# Patient Record
Sex: Female | Born: 1975
Health system: Southern US, Community
[De-identification: ages and names within clinical notes are randomized; demographics above are authoritative.]

## PROBLEM LIST (undated history)

## (undated) DIAGNOSIS — J45909 Unspecified asthma, uncomplicated: Secondary | ICD-10-CM

## (undated) DIAGNOSIS — J449 Chronic obstructive pulmonary disease, unspecified: Secondary | ICD-10-CM

## (undated) DIAGNOSIS — F32A Depression, unspecified: Secondary | ICD-10-CM

## (undated) DIAGNOSIS — J309 Allergic rhinitis, unspecified: Secondary | ICD-10-CM

## (undated) DIAGNOSIS — F419 Anxiety disorder, unspecified: Secondary | ICD-10-CM

## (undated) DIAGNOSIS — E538 Deficiency of other specified B group vitamins: Secondary | ICD-10-CM

## (undated) DIAGNOSIS — I10 Essential (primary) hypertension: Secondary | ICD-10-CM

## (undated) DIAGNOSIS — T7840XA Allergy, unspecified, initial encounter: Secondary | ICD-10-CM

## (undated) DIAGNOSIS — I441 Atrioventricular block, second degree: Secondary | ICD-10-CM

## (undated) DIAGNOSIS — G43909 Migraine, unspecified, not intractable, without status migrainosus: Secondary | ICD-10-CM

## (undated) DIAGNOSIS — N926 Irregular menstruation, unspecified: Secondary | ICD-10-CM

## (undated) DIAGNOSIS — R6882 Decreased libido: Secondary | ICD-10-CM

## (undated) DIAGNOSIS — F320 Major depressive disorder, single episode, mild: Secondary | ICD-10-CM

## (undated) DIAGNOSIS — F17200 Nicotine dependence, unspecified, uncomplicated: Secondary | ICD-10-CM

## (undated) DIAGNOSIS — IMO0002 Reserved for concepts with insufficient information to code with codable children: Secondary | ICD-10-CM

## (undated) DIAGNOSIS — G629 Polyneuropathy, unspecified: Secondary | ICD-10-CM

## (undated) HISTORY — DX: Migraine, unspecified, not intractable, without status migrainosus: G43.909

## (undated) HISTORY — PX: COLONOSCOPY: SHX174

## (undated) HISTORY — DX: Deficiency of other specified B group vitamins: E53.8

## (undated) HISTORY — DX: Unspecified asthma, uncomplicated: J45.909

## (undated) HISTORY — DX: Decreased libido: R68.82

## (undated) HISTORY — DX: Chronic obstructive pulmonary disease, unspecified: J44.9

## (undated) HISTORY — DX: Allergy, unspecified, initial encounter: T78.40XA

## (undated) HISTORY — DX: Atrioventricular block, second degree: I44.1

## (undated) HISTORY — DX: Anxiety disorder, unspecified: F41.9

## (undated) HISTORY — DX: Major depressive disorder, single episode, mild: F32.0

## (undated) HISTORY — DX: Nicotine dependence, unspecified, uncomplicated: F17.200

## (undated) HISTORY — DX: Irregular menstruation, unspecified: N92.6

## (undated) HISTORY — DX: Polyneuropathy, unspecified: G62.9

## (undated) HISTORY — DX: Allergic rhinitis, unspecified: J30.9

## (undated) HISTORY — PX: ESOPHAGOGASTRODUODENOSCOPY: SHX1529

## (undated) HISTORY — DX: Reserved for concepts with insufficient information to code with codable children: IMO0002

## (undated) HISTORY — DX: Essential (primary) hypertension: I10

## (undated) HISTORY — DX: Depression, unspecified: F32.A

## (undated) HISTORY — PX: WISDOM TOOTH EXTRACTION: SHX21

---

## 2002-07-19 DIAGNOSIS — J4489 Other specified chronic obstructive pulmonary disease: Secondary | ICD-10-CM | POA: Insufficient documentation

## 2007-03-18 ENCOUNTER — Emergency Department: Payer: Self-pay | Admitting: Internal Medicine

## 2007-03-18 ENCOUNTER — Observation Stay: Payer: Self-pay | Admitting: Unknown Physician Specialty

## 2007-05-16 ENCOUNTER — Ambulatory Visit: Payer: Self-pay

## 2007-06-24 ENCOUNTER — Observation Stay: Payer: Self-pay | Admitting: Unknown Physician Specialty

## 2007-06-28 ENCOUNTER — Inpatient Hospital Stay: Payer: Self-pay | Admitting: Obstetrics & Gynecology

## 2010-01-21 HISTORY — PX: NASAL SINUS SURGERY: SHX719

## 2012-12-22 LAB — HM PAP SMEAR: HM Pap smear: NORMAL

## 2014-10-10 ENCOUNTER — Ambulatory Visit: Payer: Self-pay | Admitting: Family Medicine

## 2015-03-10 ENCOUNTER — Encounter: Payer: Self-pay | Admitting: Family Medicine

## 2015-04-27 ENCOUNTER — Ambulatory Visit: Payer: Self-pay | Admitting: Family Medicine

## 2015-04-29 ENCOUNTER — Other Ambulatory Visit: Payer: Self-pay | Admitting: Family Medicine

## 2015-05-06 ENCOUNTER — Other Ambulatory Visit: Payer: Self-pay | Admitting: Family Medicine

## 2015-05-06 DIAGNOSIS — Z720 Tobacco use: Secondary | ICD-10-CM | POA: Insufficient documentation

## 2015-05-06 DIAGNOSIS — F411 Generalized anxiety disorder: Secondary | ICD-10-CM | POA: Insufficient documentation

## 2015-05-06 DIAGNOSIS — N926 Irregular menstruation, unspecified: Secondary | ICD-10-CM | POA: Insufficient documentation

## 2015-05-06 DIAGNOSIS — Z6831 Body mass index (BMI) 31.0-31.9, adult: Secondary | ICD-10-CM | POA: Insufficient documentation

## 2015-05-06 DIAGNOSIS — G43009 Migraine without aura, not intractable, without status migrainosus: Secondary | ICD-10-CM | POA: Insufficient documentation

## 2015-05-06 DIAGNOSIS — G2581 Restless legs syndrome: Secondary | ICD-10-CM | POA: Insufficient documentation

## 2015-05-06 DIAGNOSIS — F520 Hypoactive sexual desire disorder: Secondary | ICD-10-CM | POA: Insufficient documentation

## 2015-05-06 DIAGNOSIS — J4540 Moderate persistent asthma, uncomplicated: Secondary | ICD-10-CM | POA: Insufficient documentation

## 2015-05-06 DIAGNOSIS — I441 Atrioventricular block, second degree: Secondary | ICD-10-CM | POA: Insufficient documentation

## 2015-05-06 DIAGNOSIS — E538 Deficiency of other specified B group vitamins: Secondary | ICD-10-CM | POA: Insufficient documentation

## 2015-05-06 DIAGNOSIS — J3089 Other allergic rhinitis: Secondary | ICD-10-CM | POA: Insufficient documentation

## 2015-05-06 DIAGNOSIS — G629 Polyneuropathy, unspecified: Secondary | ICD-10-CM | POA: Insufficient documentation

## 2015-05-06 DIAGNOSIS — I10 Essential (primary) hypertension: Secondary | ICD-10-CM | POA: Insufficient documentation

## 2015-05-06 DIAGNOSIS — IMO0001 Reserved for inherently not codable concepts without codable children: Secondary | ICD-10-CM | POA: Insufficient documentation

## 2015-05-06 NOTE — Telephone Encounter (Signed)
Sent one month but needs follow up, last refill

## 2015-05-25 ENCOUNTER — Other Ambulatory Visit: Payer: Self-pay | Admitting: Family Medicine

## 2015-05-25 NOTE — Telephone Encounter (Signed)
She needs follow up

## 2015-05-26 ENCOUNTER — Telehealth: Payer: Self-pay | Admitting: Family Medicine

## 2015-05-26 NOTE — Telephone Encounter (Signed)
PT IS OUT OF ESCITALOPRAM AND ALPRAZOLM. SHE TRIED GETTING AN APPT BUT YOU DO NOT HAV ANYTHING TIL THE FIRST AVAIL AUG 8TH. SHE IS OUT OF HER MEDICATION AND NEEDS THESE REFILLS. PHARM IS [facility] [address]

## 2015-05-27 ENCOUNTER — Other Ambulatory Visit: Payer: Self-pay

## 2015-05-27 MED ORDER — ESCITALOPRAM OXALATE 10 MG PO TABS: 15.0000 mg | ORAL_TABLET | Freq: Every day | ORAL | Status: DC

## 2015-05-27 MED ORDER — ALPRAZOLAM 0.5 MG PO TABS: 0.5000 mg | ORAL_TABLET | Freq: Two times a day (BID) | ORAL | Status: DC | PRN

## 2015-06-12 ENCOUNTER — Other Ambulatory Visit: Payer: Self-pay

## 2015-06-12 DIAGNOSIS — I10 Essential (primary) hypertension: Secondary | ICD-10-CM

## 2015-06-13 MED ORDER — LOSARTAN POTASSIUM-HCTZ 100-25 MG PO TABS: 1.0000 | ORAL_TABLET | Freq: Every day | ORAL | Status: DC

## 2015-06-29 ENCOUNTER — Ambulatory Visit (INDEPENDENT_AMBULATORY_CARE_PROVIDER_SITE_OTHER): Payer: Managed Care, Other (non HMO) | Admitting: Family Medicine

## 2015-06-29 ENCOUNTER — Encounter (INDEPENDENT_AMBULATORY_CARE_PROVIDER_SITE_OTHER): Payer: Self-pay

## 2015-06-29 ENCOUNTER — Encounter: Payer: Self-pay | Admitting: Family Medicine

## 2015-06-29 VITALS — BP 118/62 | HR 107 | Temp 98.3°F | Resp 18 | Ht 66.0 in | Wt 198.4 lb

## 2015-06-29 DIAGNOSIS — F419 Anxiety disorder, unspecified: Secondary | ICD-10-CM

## 2015-06-29 DIAGNOSIS — I10 Essential (primary) hypertension: Secondary | ICD-10-CM

## 2015-06-29 DIAGNOSIS — J309 Allergic rhinitis, unspecified: Secondary | ICD-10-CM | POA: Diagnosis not present

## 2015-06-29 DIAGNOSIS — G43009 Migraine without aura, not intractable, without status migrainosus: Secondary | ICD-10-CM

## 2015-06-29 DIAGNOSIS — G2581 Restless legs syndrome: Secondary | ICD-10-CM

## 2015-06-29 DIAGNOSIS — M67431 Ganglion, right wrist: Secondary | ICD-10-CM

## 2015-06-29 DIAGNOSIS — G629 Polyneuropathy, unspecified: Secondary | ICD-10-CM | POA: Diagnosis not present

## 2015-06-29 DIAGNOSIS — J3089 Other allergic rhinitis: Secondary | ICD-10-CM

## 2015-06-29 MED ORDER — LOSARTAN POTASSIUM-HCTZ 100-25 MG PO TABS: 1.0000 | ORAL_TABLET | Freq: Every day | ORAL | Status: DC

## 2015-06-29 MED ORDER — LEVOCETIRIZINE DIHYDROCHLORIDE 5 MG PO TABS: 5.0000 mg | ORAL_TABLET | Freq: Every day | ORAL | Status: DC

## 2015-06-29 MED ORDER — GABAPENTIN ENACARBIL ER 300 MG PO TBCR: 1.0000 | EXTENDED_RELEASE_TABLET | Freq: Every day | ORAL | Status: DC

## 2015-06-29 MED ORDER — ESCITALOPRAM OXALATE 20 MG PO TABS: 20.0000 mg | ORAL_TABLET | Freq: Every day | ORAL | Status: DC

## 2015-06-29 MED ORDER — ALPRAZOLAM 0.5 MG PO TABS: 0.5000 mg | ORAL_TABLET | Freq: Two times a day (BID) | ORAL | Status: DC | PRN

## 2015-06-29 NOTE — Progress Notes (Signed)
Name: Destiny Atkinson   MRN: [id number]    DOB: 1976-01-12   Date:06/29/2015       Progress Note  Subjective  Chief Complaint  Chief Complaint  Patient presents with  . Medication Refill    3 month F/U  . Hypertension    No problems checks at work 122/74  . Anxiety    unchanged- takes medication when patient feels anxiety coming on  . Depression    feeling anxious again about 2-3 hours before medication is due    HPI   Migraine : she states she had two episodes since last visit, 3 months ago. She controls symptoms with Excedrin migraine and a nap, she does not want to change her regiment since symptoms have improved. She states she has phonophobia, and also photophobia. No nausea or vomiting. Migraine is described as a sharp pain over left eye and radiates to left temporal area.  HTN: taking medications and denies side effects, bp is usually well controlled  Anxiety/Depression: taking 15mg  of Lexapro daily, but feels very edgy right before she takes the next dose.  Otherwise she feels like is controlling symptoms. She has gained weight and is frustrated because she has been eating healthier and exercising, we will try adding Wellbutrin on her next visit  RSL/Peripheral Neuropathy: She has a need to move her legs when sitting down, also states that at night her legs moves constantly. She also states than when she stands up for a long time her legs feels sore/a discomfort sensation that goes from her buttocks down to her legs. She feels a prickling sensation from her feet to her toes when she first stands up. Requip stopped working and would like to try something else.  Patient Active Problem List   Diagnosis Date Noted  . Anxiety 05/06/2015  . Asthma, moderate persistent 05/06/2015  . Mobitz type I incomplete atrioventricular block 05/06/2015  . B12 deficiency 05/06/2015  . Essential (primary) hypertension 05/06/2015  . Irregular bleeding 05/06/2015  . Migraine without aura and  responsive to treatment 05/06/2015  . Lack of erotic interest 05/06/2015  . Excess weight 05/06/2015  . Peripheral neuropathy 05/06/2015  . Perennial allergic rhinitis 05/06/2015  . Restless leg 05/06/2015  . Tobacco abuse 05/06/2015    Past Surgical History  Procedure Laterality Date  . Nasal sinus surgery      Family History  Problem Relation Age of Onset  . COPD Mother   . Fibromyalgia Mother   . Hypertension Mother   . Stroke Mother   . Asthma Mother   . Arthritis Mother   . Depression Mother   . Heart disease Mother   . Diabetes Father   . Epilepsy Brother     History   Social History  . Marital Status: Married    Spouse Name: N/A  . Number of Children: N/A  . Years of Education: N/A   Occupational History  . Not on file.   Social History Main Topics  . Smoking status: Current Every Day Smoker -- 0.50 packs/day for 20 years    Types: Cigarettes    Start date: 06/29/1995  . Smokeless tobacco: Never Used  . Alcohol Use: 0.0 oz/week    0 Standard drinks or equivalent per week     Comment: rarely  . Drug Use: No  . Sexual Activity:    Partners: Male    Birth Control/ Protection: Pill   Other Topics Concern  . Not on file   Social History Narrative  .  No narrative on file     Current outpatient prescriptions:  .  albuterol (PROAIR HFA) 108 (90 BASE) MCG/ACT inhaler, Inhale 2 puffs into the lungs every 4 (four) hours as needed., Disp: , Rfl:  .  ALPRAZolam (XANAX) 0.5 MG tablet, Take 1 tablet (0.5 mg total) by mouth 2 (two) times daily as needed., Disp: 60 tablet, Rfl: 0 .  EPINEPHrine (EPIPEN 2-PAK) 0.3 mg/0.3 mL IJ SOAJ injection, 1 Device., Disp: , Rfl:  .  escitalopram (LEXAPRO) 20 MG tablet, Take 1 tablet (20 mg total) by mouth daily., Disp: 90 tablet, Rfl: 1 .  Fluticasone Furoate-Vilanterol (BREO ELLIPTA) 100-25 MCG/INH AEPB, Inhale 1 puff into the lungs daily., Disp: , Rfl:  .  ipratropium-albuterol (DUONEB) 0.5-2.5 (3) MG/3ML SOLN, Take 3 mLs  by nebulization every 6 (six) hours as needed., Disp: , Rfl:  .  levocetirizine (XYZAL) 5 MG tablet, Take 1 tablet (5 mg total) by mouth daily., Disp: 90 tablet, Rfl: 1 .  losartan-hydrochlorothiazide (HYZAAR) 100-25 MG per tablet, Take 1 tablet by mouth daily., Disp: 90 tablet, Rfl: 1 .  norethindrone (MICRONOR,CAMILA,ERRIN) 0.35 MG tablet, Take 1 tablet by mouth daily., Disp: , Rfl:  .  Gabapentin Enacarbil ER 300 MG TBCR, Take 1 tablet by mouth daily. Increase to two daily if no improvement after one week, Disp: 60 tablet, Rfl: 0  Allergies  Allergen Reactions  . Dog Epithelium     Anaphylaxis with Rabbits.  Dogs, cats, birds cause swelling, extreme itching.  . Dust Mite Extract   . Montelukast Sodium Hives  . Pollen Extract   . Soap   . Tape   . Tree Extract      ROS  Constitutional: Negative for fever or significant  weight change.  Respiratory: Negative for cough and shortness of breath.   Cardiovascular: Negative for chest pain or palpitations.  Gastrointestinal: Negative for abdominal pain, no bowel changes.  Musculoskeletal: Negative for gait problem or joint swelling. Cyst on right wrist Skin: Negative for rash.  Neurological: Negative for dizziness or headache.  No other specific complaints in a complete review of systems (except as listed in HPI above).  Objective  Filed Vitals:   06/29/15 1509  BP: 118/62  Pulse: 107  Temp: 98.3 F (36.8 C)  TempSrc: Oral  Resp: 18  Height: 5\' 6"  (1.676 m)  Weight: 198 lb 6.4 oz (89.994 kg)  SpO2: 94%    Body mass index is 32.04 kg/(m^2).  Physical Exam  Constitutional: Patient appears well-developed and well-nourished. Obese  No distress.  HEENT: head atraumatic, normocephalic, pupils equal and reactive to light,  neck supple, throat within normal limits Cardiovascular: Normal rate, regular rhythm and normal heart sounds.  No murmur heard. No BLE edema. Pedis dorsalis 2 plus bilaterally  Pulmonary/Chest: Effort normal  and breath sounds normal. No respiratory distress. Abdominal: Soft.  There is no tenderness. Psychiatric: Patient has a normal mood and affect. behavior is normal. Judgment and thought content normal. Muscular Skeletal: ganglion cyst on right wrist    PHQ2/9: Depression screen PHQ 2/9 06/29/2015  Decreased Interest 0  Down, Depressed, Hopeless 1  PHQ - 2 Score 1    Fall Risk: Fall Risk  06/29/2015  Falls in the past year? No     Assessment & Plan  1. Migraine without aura and responsive to treatment Doing well   2. Restless leg We will try Horizant - Gabapentin Enacarbil ER 300 MG TBCR; Take 1 tablet by mouth daily. Increase to two daily  if no improvement after one week  Dispense: 60 tablet; Refill: 0  3. Perennial allergic rhinitis  - levocetirizine (XYZAL) 5 MG tablet; Take 1 tablet (5 mg total) by mouth daily.  Dispense: 90 tablet; Refill: 1  4. Peripheral neuropathy Try Horizant  5. Essential hypertension At goal  - losartan-hydrochlorothiazide (HYZAAR) 100-25 MG per tablet; Take 1 tablet by mouth daily.  Dispense: 90 tablet; Refill: 1  6. Anxiety Increase dose of Lexapro to , consider adding WEllbutrin next visit - escitalopram (LEXAPRO) 20 MG tablet; Take 1 tablet (20 mg total) by mouth daily.  Dispense: 90 tablet; Refill: 1 - ALPRAZolam (XANAX) 0.5 MG tablet; Take 1 tablet (0.5 mg total) by mouth 2 (two) times daily as needed.  Dispense: 60 tablet; Refill: 0  7. Ganglion cyst of wrist, right reassurance

## 2015-09-22 ENCOUNTER — Encounter: Payer: Self-pay | Admitting: Emergency Medicine

## 2015-09-22 ENCOUNTER — Ambulatory Visit
Admission: EM | Admit: 2015-09-22 | Discharge: 2015-09-22 | Disposition: A | Discharge status: Home / Self Care | Payer: Managed Care, Other (non HMO) | Attending: Family Medicine | Admitting: Family Medicine

## 2015-09-22 ENCOUNTER — Ambulatory Visit: Payer: Managed Care, Other (non HMO)

## 2015-09-22 DIAGNOSIS — J45901 Unspecified asthma with (acute) exacerbation: Secondary | ICD-10-CM | POA: Diagnosis not present

## 2015-09-22 DIAGNOSIS — Z720 Tobacco use: Secondary | ICD-10-CM

## 2015-09-22 DIAGNOSIS — J40 Bronchitis, not specified as acute or chronic: Secondary | ICD-10-CM

## 2015-09-22 MED ORDER — AZITHROMYCIN 250 MG PO TABS: ORAL_TABLET | ORAL | Status: DC

## 2015-09-22 MED ORDER — METHYLPREDNISOLONE SODIUM SUCC 125 MG IJ SOLR
125.0000 mg | Freq: Once | INTRAMUSCULAR | Status: AC
  Administered 2015-09-22: 125 mg via INTRAMUSCULAR

## 2015-09-22 MED ORDER — PREDNISONE 10 MG (21) PO TBPK: ORAL_TABLET | ORAL | Status: DC

## 2015-09-22 MED ORDER — HYDROCOD POLST-CPM POLST ER 10-8 MG/5ML PO SUER: 5.0000 mL | Freq: Two times a day (BID) | ORAL | Status: DC | PRN

## 2015-09-22 NOTE — ED Provider Notes (Signed)
CSN: [id number]     Arrival date & time 09/22/15  7829 History   First MD Initiated Contact with Patient 09/22/15 1111    Nurses notes were reviewed. Chief Complaint  Patient presents with  . Facial Pain  . Cough   patient's been coughing now for about 3 weeks history of asthma which has been getting worse. Initially was productive now is longer productive. She does smoke states that the last few days she's not been able to smoke (Consider location/radiation/quality/duration/timing/severity/associated sxs/prior Treatment) Patient is a 39 y.o. female presenting with cough.  Cough Cough characteristics:  Productive and non-productive Sputum characteristics:  White Severity:  Moderate Onset quality:  Sudden Duration:  3 weeks Timing:  Constant Progression:  Worsening Chronicity:  New Smoker: yes   Context: sick contacts, smoke exposure and upper respiratory infection   Context: not animal exposure, not exposure to allergens, not occupational exposure, not weather changes and not with activity   Relieved by:  Nothing Ineffective treatments:  Beta-agonist inhaler, ipratropium inhaler and home nebulizer Associated symptoms: shortness of breath and wheezing   Associated symptoms: no chest pain, no fever, no headaches and no myalgias   Risk factors: recent infection   Risk factors: no chemical exposure and no recent travel     Past Medical History  Diagnosis Date  . Anxiety   . Hypertension   . Asthma   . B12 deficiency   . Migraines   . Irregular menstrual bleeding   . Decreased libido    Past Surgical History  Procedure Laterality Date  . Nasal sinus surgery     Family History  Problem Relation Age of Onset  . COPD Mother   . Fibromyalgia Mother   . Hypertension Mother   . Stroke Mother   . Asthma Mother   . Arthritis Mother   . Depression Mother   . Heart disease Mother   . Diabetes Father   . Epilepsy Brother    Social History  Substance Use Topics  . Smoking  status: Current Every Day Smoker -- 0.50 packs/day for 20 years    Types: Cigarettes    Start date: 06/29/1995  . Smokeless tobacco: Never Used  . Alcohol Use: 0.0 oz/week    0 Standard drinks or equivalent per week     Comment: rarely   OB History    No data available     Review of Systems  Constitutional: Negative for fever.  Respiratory: Positive for cough, shortness of breath and wheezing.   Cardiovascular: Negative for chest pain.  Musculoskeletal: Negative for myalgias.  Neurological: Negative for headaches.    Allergies  Dog epithelium; Dust mite extract; Montelukast sodium; Pollen extract; Soap; Tape; and Tree extract  Home Medications   Prior to Admission medications   Medication Sig Start Date End Date Taking? Authorizing Provider  albuterol (PROAIR HFA) 108 (90 BASE) MCG/ACT inhaler Inhale 2 puffs into the lungs every 4 (four) hours as needed. 03/14/14   Historical Provider, MD  ALPRAZolam Prudy Feeler) 0.5 MG tablet Take 1 tablet (0.5 mg total) by mouth 2 (two) times daily as needed. 06/29/15   Alba Cory, MD  azithromycin (ZITHROMAX Z-PAK) 250 MG tablet Take 2 tablets first day and then 1 po a day for 4 days 09/22/15   Hassan Rowan, MD  chlorpheniramine-HYDROcodone [facility] ER) 10-8 MG/5ML SUER Take 5 mLs by mouth every 12 (twelve) hours as needed for cough. 09/22/15   Hassan Rowan, MD  EPINEPHrine (EPIPEN 2-PAK) 0.3 mg/0.3 mL IJ  SOAJ injection 1 Device. 08/16/13   Historical Provider, MD  escitalopram (LEXAPRO) 20 MG tablet Take 1 tablet (20 mg total) by mouth daily. 06/29/15   Alba Cory, MD  Fluticasone Furoate-Vilanterol (BREO ELLIPTA) 100-25 MCG/INH AEPB Inhale 1 puff into the lungs daily.    Historical Provider, MD  Gabapentin Enacarbil ER 300 MG TBCR Take 1 tablet by mouth daily. Increase to two daily if no improvement after one week 06/29/15   Alba Cory, MD  ipratropium-albuterol (DUONEB) 0.5-2.5 (3) MG/3ML SOLN Take 3 mLs by nebulization every 6 (six)  hours as needed.    Yevonne Pax, MD  levocetirizine (XYZAL) 5 MG tablet Take 1 tablet (5 mg total) by mouth daily. 06/29/15   Alba Cory, MD  losartan-hydrochlorothiazide (HYZAAR) 100-25 MG per tablet Take 1 tablet by mouth daily. 06/29/15   Alba Cory, MD  norethindrone (MICRONOR,CAMILA,ERRIN) 0.35 MG tablet Take 1 tablet by mouth daily.    Historical Provider, MD  predniSONE (STERAPRED UNI-PAK 21 TAB) 10 MG (21) TBPK tablet Sig 6 tablet day 1, 5 tablets day 2, 4 tablets day 3,,3tablets day 4, 2 tablets day 5, 1 tablet day 6 take all tablets orally 09/22/15   Hassan Rowan, MD   Meds Ordered and Administered this Visit   Medications  methylPREDNISolone sodium succinate (SOLU-MEDROL) 125 mg/2 mL injection 125 mg (125 mg Intramuscular Given 09/22/15 1133)    BP 115/76 mmHg  Pulse 83  Temp(Src) 98.2 F (36.8 C) (Tympanic)  Resp 18  Ht  (1.676 m)  Wt 195 lb (88.451 kg)  BMI 31.49 kg/m2  SpO2 98%  LMP 09/05/2015 (Exact Date) No data found.   Physical Exam  Constitutional: She is oriented to person, place, and time. She appears well-developed and well-nourished.  HENT:  Head: Normocephalic.  Right Ear: External ear normal.  Left Ear: External ear normal.  Eyes: Pupils are equal, round, and reactive to light.  Neck: Normal range of motion. Neck supple.  Cardiovascular: Normal rate, regular rhythm and normal heart sounds.   Pulmonary/Chest: Effort normal. She has decreased breath sounds. She has no wheezes. She has no rhonchi.  Actively coughing  Musculoskeletal: Normal range of motion.  Neurological: She is alert and oriented to person, place, and time.  Skin: Skin is warm and dry.  Psychiatric: She has a normal mood and affect.  Vitals reviewed.   ED Course  Procedures (including critical care time)  Labs Review Labs Reviewed - No data to display  Imaging Review Dg Chest 2 View  09/22/2015  CLINICAL DATA:  Cough for 3 weeks EXAM: CHEST  2 VIEW COMPARISON:   10/10/2014 FINDINGS: Cardiomediastinal silhouette is stable. No acute infiltrate or pleural effusion. No pulmonary edema. Mild perihilar bronchitic changes. Bony thorax is unremarkable. IMPRESSION: No acute infiltrate or pulmonary edema. Mild perihilar bronchitic changes. Electronically Signed   By: Natasha Mead M.D.   On: 09/22/2015 12:36     Visual Acuity Review  Right Eye Distance:   Left Eye Distance:   Bilateral Distance:    Right Eye Near:   Left Eye Near:    Bilateral Near:         MDM   1. Bronchitis   2. Asthma exacerbation   3. Tobacco abuse    Patient was given on 125 mg Solu-Medrol with improvement. She still coughing significantly so we will also get a chest x-ray and make sure no pneumonia is present. We'll place on prednisone Dosepak 6 days Z-Pak and testing for the  cough providing chest x-rays negative for pneumonia and will give a work note for today and tomorrow well.    Hassan RowanEugene Loletta Harper, MD 09/22/15 [phone]

## 2015-09-22 NOTE — Discharge Instructions (Signed)
Asthma, Adult Asthma is a condition of the lungs in which the airways tighten and narrow. Asthma can make it hard to breathe. Asthma cannot be cured, but medicine and lifestyle changes can help control it. Asthma may be started (triggered) by:  Animal skin flakes (dander).  Dust.  Cockroaches.  Pollen.  Mold.  Smoke.  Cleaning products.  Hair sprays or aerosol sprays.  Paint fumes or strong smells.  Cold air, weather changes, and winds.  Crying or laughing hard.  Stress.  Certain medicines or drugs.  Foods, such as dried fruit, potato chips, and sparkling grape juice.  Infections or conditions (colds, flu).  Exercise.  Certain medical conditions or diseases.  Exercise or tiring activities. HOME CARE   Take medicine as told by your doctor.  Use a peak flow meter as told by your doctor. A peak flow meter is a tool that measures how well the lungs are working.  Record and keep track of the peak flow meter's readings.  Understand and use the asthma action plan. An asthma action plan is a written plan for taking care of your asthma and treating your attacks.  To help prevent asthma attacks:  Do not smoke. Stay away from secondhand smoke.  Change your heating and air conditioning filter often.  Limit your use of fireplaces and wood stoves.  Get rid of pests (such as roaches and mice) and their droppings.  Throw away plants if you see mold on them.  Clean your floors. Dust regularly. Use cleaning products that do not smell.  Have someone vacuum when you are not home. Use a vacuum cleaner with a HEPA filter if possible.  Replace carpet with wood, tile, or vinyl flooring. Carpet can trap animal skin flakes and dust.  Use allergy-proof pillows, mattress covers, and box spring covers.  Wash bed sheets and blankets every week in hot water and dry them in a dryer.  Use blankets that are made of polyester or cotton.  Clean bathrooms and kitchens with bleach.  If possible, have someone repaint the walls in these rooms with mold-resistant paint. Keep out of the rooms that are being cleaned and painted.  Wash hands often. GET HELP IF:  You have make a whistling sound when breaking (wheeze), have shortness of breath, or have a cough even if taking medicine to prevent attacks.  The colored mucus you cough up (sputum) is thicker than usual.  The colored mucus you cough up changes from clear or white to yellow, green, gray, or bloody.  You have problems from the medicine you are taking such as:  A rash.  Itching.  Swelling.  Trouble breathing.  You need reliever medicines more than 2-3 times a week.  Your peak flow measurement is still at 50-79% of your personal best after following the action plan for 1 hour.  You have a fever. GET HELP RIGHT AWAY IF:   You seem to be worse and are not responding to medicine during an asthma attack.  You are short of breath even at rest.  You get short of breath when doing very little activity.  You have trouble eating, drinking, or talking.  You have chest pain.  You have a fast heartbeat.  Your lips or fingernails start to turn blue.  You are light-headed, dizzy, or faint.  Your peak flow is less than 50% of your personal best.   This information is not intended to replace advice given to you by your health care provider. Make sure  you discuss any questions you have with your health care provider.   Document Released: 04/25/2008 Document Revised: 07/29/2015 Document Reviewed: 06/06/2013 Elsevier Interactive Patient Education 2016 Else[employer] Inc.  Bronchospasm, Adult A bronchospasm is when the tubes that carry air in and out of your lungs (airways) spasm or tighten. During a bronchospasm it is hard to breathe. This is because the airways get smaller. A bronchospasm can be triggered by:  Allergies. These may be to animals, pollen, food, or mold.  Infection. This is a common cause of  bronchospasm.  Exercise.  Irritants. These include pollution, cigarette smoke, strong odors, aerosol sprays, and paint fumes.  Weather changes.  Stress.  Being emotional. HOME CARE   Always have a plan for getting help. Know when to call your doctor and local emergency services (911 in the [place] Know where you can get emergency care.  Only take medicines as told by your doctor.  If you were prescribed an inhaler or nebulizer machine, ask your doctor how to use it correctly. Always use a spacer with your inhaler if you were given one.  Stay calm during an attack. Try to relax and breathe more slowly.  Control your home environment:  Change your heating and air conditioning filter at least once a month.  Limit your use of fireplaces and wood stoves.  Do not  smoke. Do not  allow smoking in your home.  Avoid perfumes and fragrances.  Get rid of pests (such as roaches and mice) and their droppings.  Throw away plants if you see mold on them.  Keep your house clean and dust free.  Replace carpet with wood, tile, or vinyl flooring. Carpet can trap dander and dust.  Use allergy-proof pillows, mattress covers, and box spring covers.  Wash bed sheets and blankets every week in hot water. Dry them in a dryer.  Use blankets that are made of polyester or cotton.  Wash hands frequently. GET HELP IF:  You have muscle aches.  You have chest pain.  The thick spit you spit or cough up (sputum) changes from clear or white to yellow, green, gray, or bloody.  The thick spit you spit or cough up gets thicker.  There are problems that may be related to the medicine you are given such as:  A rash.  Itching.  Swelling.  Trouble breathing. GET HELP RIGHT AWAY IF:  You feel you cannot breathe or catch your breath.  You cannot stop coughing.  Your treatment is not helping you breathe better.  You have very bad chest pain. MAKE SURE YOU:   Understand these  instructions.  Will watch your condition.  Will get help right away if you are not doing well or get worse.   This information is not intended to replace advice given to you by your health care provider. Make sure you discuss any questions you have with your health care provider.   Document Released: 09/04/2009 Document Revised: 11/28/2014 Document Reviewed: 04/30/2013 Elsevier Interactive Patient Education 2016 [employer].  Steps to Quit Smoking  Smoking tobacco can be harmful to your health and can affect almost every organ in your body. Smoking puts you, and those around you, at risk for developing many serious chronic diseases. Quitting smoking is difficult, but it is one of the best things that you can do for your health. It is never too late to quit. WHAT ARE THE BENEFITS OF QUITTING SMOKING? When you quit smoking, you lower your risk of developing serious diseases  and conditions, such as:  Lung cancer or lung disease, such as COPD.  Heart disease.  Stroke.  Heart attack.  Infertility.  Osteoporosis and bone fractures. Additionally, symptoms such as coughing, wheezing, and shortness of breath may get better when you quit. You may also find that you get sick less often because your body is stronger at fighting off colds and infections. If you are pregnant, quitting smoking can help to reduce your chances of having a baby of low birth weight. HOW DO I GET READY TO QUIT? When you decide to quit smoking, create a plan to make sure that you are successful. Before you quit:  Pick a date to quit. Set a date within the next two weeks to give you time to prepare.  Write down the reasons why you are quitting. Keep this list in places where you will see it often, such as on your bathroom mirror or in your car or wallet.  Identify the people, places, things, and activities that make you want to smoke (triggers) and avoid them. Make sure to take these actions:  Throw away all  cigarettes at home, at work, and in your car.  Throw away smoking accessories, such as Set designerashtrays and lighters.  Clean your car and make sure to empty the ashtray.  Clean your home, including curtains and carpets.  Tell your family, friends, and coworkers that you are quitting. Support from your loved ones can make quitting easier.  Talk with your health care provider about your options for quitting smoking.  Find out what treatment options are covered by your health insurance. WHAT STRATEGIES CAN I USE TO QUIT SMOKING?  Talk with your healthcare provider about different strategies to quit smoking. Some strategies include:  Quitting smoking altogether instead of gradually lessening how much you smoke over a period of time. Research shows that quitting "cold [place]key" is more successful than gradually quitting.  Attending in-person counseling to help you build problem-solving skills. You are more likely to have success in quitting if you attend several counseling sessions. Even short sessions of 10 minutes can be effective.  Finding resources and support systems that can help you to quit smoking and remain smoke-free after you quit. These resources are most helpful when you use them often. They can include:  Online chats with a Veterinary surgeoncounselor.  Telephone quitlines.  Printed Materials engineerself-help materials.  Support groups or group counseling.  Text messaging programs.  Mobile phone applications.  Taking medicines to help you quit smoking. (If you are pregnant or breastfeeding, talk with your health care provider first.) Some medicines contain nicotine and some do not. Both types of medicines help with cravings, but the medicines that include nicotine help to relieve withdrawal symptoms. Your health care provider may recommend:  Nicotine patches, gum, or lozenges.  Nicotine inhalers or sprays.  Non-nicotine medicine that is taken by mouth. Talk with your health care provider about combining  strategies, such as taking medicines while you are also receiving in-person counseling. Using these two strategies together makes you more likely to succeed in quitting than if you used either strategy on its own. If you are pregnant or breastfeeding, talk with your health care provider about finding counseling or other support strategies to quit smoking. Do not take medicine to help you quit smoking unless told to do so by your health care provider. WHAT THINGS CAN I DO TO MAKE IT EASIER TO QUIT? Quitting smoking might feel overwhelming at first, but there is a lot that  you can do to make it easier. Take these important actions:  Reach out to your family and friends and ask that they support and encourage you during this time. Call telephone quitlines, reach out to support groups, or work with a counselor for support.  Ask people who smoke to avoid smoking around you.  Avoid places that trigger you to smoke, such as bars, parties, or smoke-break areas at work.  Spend time around people who do not smoke.  Lessen stress in your life, because stress can be a smoking trigger for some people. To lessen stress, try:  Exercising regularly.  Deep-breathing exercises.  Yoga.  Meditating.  Performing a body scan. This involves closing your eyes, scanning your body from head to toe, and noticing which parts of your body are particularly tense. Purposefully relax the muscles in those areas.  Download or purchase mobile phone or tablet apps (applications) that can help you stick to your quit plan by providing reminders, tips, and encouragement. There are many free apps, such as QuitGuide from the [employer] developer for Disease Control and Prevention). You can find other support for quitting smoking (smoking cessation) through smokefree.gov and other websites. HOW WILL I FEEL WHEN I QUIT SMOKING? Within the first 24 hours of quitting smoking, you may start to feel some withdrawal symptoms. These symptoms are  usually most noticeable 2-3 days after quitting, but they usually do not last beyond 2-3 weeks. Changes or symptoms that you might experience include:  Mood swings.  Restlessness, anxiety, or irritation.  Difficulty concentrating.  Dizziness.  Strong cravings for sugary foods in addition to nicotine.  Mild weight gain.  Constipation.  Nausea.  Coughing or a sore throat.  Changes in how your medicines work in your body.  A depressed mood.  Difficulty sleeping (insomnia). After the first 2-3 weeks of quitting, you may start to notice more positive results, such as:  Improved sense of smell and taste.  Decreased coughing and sore throat.  Slower heart rate.  Lower blood pressure.  Clearer skin.  The ability to breathe more easily.  Fewer sick days. Quitting smoking is very challenging for most people. Do not get discouraged if you are not successful the first time. Some people need to make many attempts to quit before they achieve long-term success. Do your best to stick to your quit plan, and talk with your health care provider if you have any questions or concerns.   This information is not intended to replace advice given to you by your health care provider. Make sure you discuss any questions you have with your health care provider.   Document Released: 11/01/2001 Document Revised: 03/24/2015 Document Reviewed: 03/24/2015 Elsevier Interactive Patient Education [employer]

## 2015-09-22 NOTE — ED Notes (Signed)
Patient c/o cough, chest congestion, sinus pressure and congestion, and HAs for 3 weeks.

## 2015-10-01 ENCOUNTER — Ambulatory Visit: Payer: Managed Care, Other (non HMO) | Admitting: Family Medicine

## 2015-11-17 ENCOUNTER — Ambulatory Visit (INDEPENDENT_AMBULATORY_CARE_PROVIDER_SITE_OTHER): Payer: Managed Care, Other (non HMO) | Admitting: Family Medicine

## 2015-11-17 ENCOUNTER — Encounter: Payer: Self-pay | Admitting: Family Medicine

## 2015-11-17 VITALS — BP 108/66 | HR 91 | Temp 98.0°F | Resp 16 | Ht 66.0 in | Wt 197.8 lb

## 2015-11-17 DIAGNOSIS — Z13220 Encounter for screening for lipoid disorders: Secondary | ICD-10-CM

## 2015-11-17 DIAGNOSIS — G629 Polyneuropathy, unspecified: Secondary | ICD-10-CM

## 2015-11-17 DIAGNOSIS — F411 Generalized anxiety disorder: Secondary | ICD-10-CM | POA: Diagnosis not present

## 2015-11-17 DIAGNOSIS — J4541 Moderate persistent asthma with (acute) exacerbation: Secondary | ICD-10-CM

## 2015-11-17 DIAGNOSIS — G2581 Restless legs syndrome: Secondary | ICD-10-CM | POA: Diagnosis not present

## 2015-11-17 DIAGNOSIS — J3089 Other allergic rhinitis: Secondary | ICD-10-CM

## 2015-11-17 DIAGNOSIS — J309 Allergic rhinitis, unspecified: Secondary | ICD-10-CM | POA: Diagnosis not present

## 2015-11-17 DIAGNOSIS — E538 Deficiency of other specified B group vitamins: Secondary | ICD-10-CM | POA: Diagnosis not present

## 2015-11-17 DIAGNOSIS — I10 Essential (primary) hypertension: Secondary | ICD-10-CM

## 2015-11-17 DIAGNOSIS — G43009 Migraine without aura, not intractable, without status migrainosus: Secondary | ICD-10-CM | POA: Diagnosis not present

## 2015-11-17 DIAGNOSIS — F320 Major depressive disorder, single episode, mild: Secondary | ICD-10-CM | POA: Insufficient documentation

## 2015-11-17 DIAGNOSIS — M25522 Pain in left elbow: Secondary | ICD-10-CM

## 2015-11-17 DIAGNOSIS — Z23 Encounter for immunization: Secondary | ICD-10-CM | POA: Diagnosis not present

## 2015-11-17 MED ORDER — ALBUTEROL SULFATE HFA 108 (90 BASE) MCG/ACT IN AERS
2.0000 | INHALATION_SPRAY | RESPIRATORY_TRACT | Status: DC | PRN
Start: 2015-11-17 — End: 2016-05-03

## 2015-11-17 MED ORDER — LEVOCETIRIZINE DIHYDROCHLORIDE 5 MG PO TABS: 5.0000 mg | ORAL_TABLET | Freq: Every day | ORAL | Status: DC

## 2015-11-17 MED ORDER — ESCITALOPRAM OXALATE 20 MG PO TABS: 20.0000 mg | ORAL_TABLET | Freq: Every day | ORAL | Status: DC

## 2015-11-17 MED ORDER — LOSARTAN POTASSIUM-HCTZ 100-25 MG PO TABS: 1.0000 | ORAL_TABLET | Freq: Every day | ORAL | Status: DC

## 2015-11-17 MED ORDER — MONTELUKAST SODIUM 10 MG PO TABS: 10.0000 mg | ORAL_TABLET | Freq: Every day | ORAL | Status: DC

## 2015-11-17 MED ORDER — CYANOCOBALAMIN 1000 MCG/ML IJ SOLN
1000.0000 ug | Freq: Once | INTRAMUSCULAR | Status: AC
  Administered 2015-11-17: 1000 ug via INTRAMUSCULAR

## 2015-11-17 MED ORDER — ALPRAZOLAM 0.5 MG PO TABS: 0.5000 mg | ORAL_TABLET | Freq: Two times a day (BID) | ORAL | Status: DC | PRN

## 2015-11-17 MED ORDER — EPINEPHRINE 0.3 MG/0.3ML IJ SOAJ: 0.3000 mg | Freq: Once | INTRAMUSCULAR | Status: DC

## 2015-11-17 NOTE — Progress Notes (Signed)
Name: Destiny Atkinson   MRN: [id number]    DOB: 1976/09/05   Date:11/17/2015       Progress Note  Subjective  Chief Complaint  Chief Complaint  Patient presents with  . Medication Refill    3 month follow-up  . Hypertension  . Depression  . Anxiety  . Asthma    cough,sob and wheezing  . Elbow Pain    left onset 3 months worsening and hard to lift objects    HPI  Migraine : she states she had two episodes since last visit, 3 months ago. She controls symptoms with Excedrin migraine and a nap, she does not want to change her regiment since symptoms have improved. She states she has phonophobia, and also photophobia. No nausea or vomiting. Migraine is described as a sharp pain over left eye and radiates to left temporal area. No changes  HTN: taking medications and denies side effects, bp is usually well controlled. No chest pain - unless if she has a panic attack, occasionally has palpitation, also related to anxiety, but resolves with deep slow breaths .  Anxiety/Depression: taking  of Lexapro daily since last visit and states symptoms have improved, she is not feeling edgy She continues to gain weight. Discussed Wellbutrin, but she wants to hold off for now. She will try increasing physical activity   RSL/Peripheral Neuropathy: She has a need to move her legs when sitting down, also states that at night her legs moves constantly. She also states than when she stands up for a long time her legs feels sore/a discomfort sensation that goes from her buttocks down to her legs. She feels a prickling sensation from her feet to her toes when she first stands up. Requip stopped working and 3 months ago she was given Horizant but did not work even at the 600 mg dose. She has B12 deficiency and is taking otc supplementation, discussed referral to neurologist and EMG/NCS and she is willing to go now. We will recheck labs also.   Left elbow pain: she states a few months ago she picked up a heavy  item at work and has been having left lateral elbow pain, described as sharp and shoots down her left forearm. It was acute onset and she hard a pop on the forearm and had some swelling. She is left hand dominant. Pain is worse when lifting items with left hand. She has to use right hand to assist the left side.   AR: she his allergic to cats, but has 3 cats at home, also allergic to dogs and has one of them, uses Rhinochort otc, xyzal and singulair. Still wakes up with puffy eyes every morning, she has rhinorrhea, nasal congestion, improves when she is not home. She does not want to get rid of her animals  Asthma: she still smokes, about 8 cigarettes day now, has a chronic cough , and daily wheezing, she sees Dr. Welton Flakes, she was on Breo, but she is not sure if she was supposed to stay on it, advised to contact Dr. Welton Flakes.     Patient Active Problem List   Diagnosis Date Noted  . Anxiety 05/06/2015  . Asthma, moderate persistent 05/06/2015  . Mobitz type I incomplete atrioventricular block 05/06/2015  . B12 deficiency 05/06/2015  . Essential (primary) hypertension 05/06/2015  . Irregular bleeding 05/06/2015  . Migraine without aura and responsive to treatment 05/06/2015  . Lack of erotic interest 05/06/2015  . Excess weight 05/06/2015  . Peripheral neuropathy (HCC)  05/06/2015  . Perennial allergic rhinitis 05/06/2015  . Restless leg 05/06/2015  . Tobacco abuse 05/06/2015    Past Surgical History  Procedure Laterality Date  . Nasal sinus surgery      Family History  Problem Relation Age of Onset  . COPD Mother   . Fibromyalgia Mother   . Hypertension Mother   . Stroke Mother   . Asthma Mother   . Arthritis Mother   . Depression Mother   . Heart disease Mother   . Diabetes Father   . Epilepsy Brother     Social History   Social History  . Marital Status: Married    Spouse Name: N/A  . Number of Children: N/A  . Years of Education: N/A   Occupational History  . Not on  file.   Social History Main Topics  . Smoking status: Current Every Day Smoker -- 0.50 packs/day for 20 years    Types: Cigarettes    Start date: 06/29/1995  . Smokeless tobacco: Never Used  . Alcohol Use: 0.0 oz/week    0 Standard drinks or equivalent per week     Comment: rarely  . Drug Use: No  . Sexual Activity:    Partners: Male    Birth Control/ Protection: Pill   Other Topics Concern  . Not on file   Social History Narrative     Current outpatient prescriptions:  .  albuterol (PROAIR HFA) 108 (90 BASE) MCG/ACT inhaler, Inhale 2 puffs into the lungs every 4 (four) hours as needed., Disp: 1 Inhaler, Rfl: 1 .  ALPRAZolam (XANAX) 0.5 MG tablet, Take 1 tablet (0.5 mg total) by mouth 2 (two) times daily as needed., Disp: 60 tablet, Rfl: 0 .  EPINEPHrine (EPIPEN 2-PAK) 0.3 mg/0.3 mL IJ SOAJ injection, Inject 0.3 mLs (0.3 mg total) into the muscle once., Disp: 2 Device, Rfl: 1 .  escitalopram (LEXAPRO) 20 MG tablet, Take 1 tablet (20 mg total) by mouth daily., Disp: 90 tablet, Rfl: 1 .  ipratropium-albuterol (DUONEB) 0.5-2.5 (3) MG/3ML SOLN, Take 3 mLs by nebulization every 6 (six) hours as needed., Disp: , Rfl:  .  levocetirizine (XYZAL) 5 MG tablet, Take 1 tablet (5 mg total) by mouth daily., Disp: 90 tablet, Rfl: 1 .  losartan-hydrochlorothiazide (HYZAAR) 100-25 MG tablet, Take 1 tablet by mouth daily., Disp: 90 tablet, Rfl: 1 .  montelukast (SINGULAIR) 10 MG tablet, Take 1 tablet (10 mg total) by mouth daily., Disp: 90 tablet, Rfl: 1 .  norethindrone (MICRONOR,CAMILA,ERRIN) 0.35 MG tablet, Take 1 tablet by mouth daily., Disp: , Rfl:   Allergies  Allergen Reactions  . Dog Epithelium     Anaphylaxis with Rabbits.  Dogs, cats, birds cause swelling, extreme itching.  . Dust Mite Extract   . Pollen Extract   . Soap   . Tape   . Tree Extract      ROS  Constitutional: Negative for fever or significant  weight change.  Respiratory: Positive for cough and shortness of  breath.   Cardiovascular: Positive for intermittent for chest pain or palpitations.  Gastrointestinal: Negative for abdominal pain, no bowel changes.  Musculoskeletal: Negative for gait problem or joint swelling.  Skin: Negative for rash.  Neurological: Negative for dizziness , positive for intermittent  headache.  No other specific complaints in a complete review of systems (except as listed in HPI above).  Objective  Filed Vitals:   11/17/15 1042  BP: 108/66  Pulse: 91  Temp: 98 F (36.7 C)  TempSrc: Oral  Resp: 16  Height: 5\' 6"  (1.676 m)  Weight: 197 lb 12.8 oz (89.721 kg)  SpO2: 93%    Body mass index is 31.94 kg/(m^2).  Physical Exam  Constitutional: Patient appears well-developed and well-nourished. Obese  No distress.  HEENT: head atraumatic, normocephalic, pupils equal and reactive to light,  neck supple, throat within normal limits, mild angioedema both eyes - she states it happens daily  Cardiovascular: Normal rate, regular rhythm and normal heart sounds.  No murmur heard. No BLE edema. Pulmonary/Chest: Effort normal and breath sounds normal. No respiratory distress. Abdominal: Soft.  There is no tenderness. Psychiatric: Patient has a normal mood and affect. behavior is normal. Judgment and thought content normal. Muscular Skeletal: pain during palpation of left lateral elbow, no pain with pronation or supination, pain when using hand to lift her elbow, mild swelling, no redness.   PHQ2/9: Depression screen [facility] 2/9 11/17/2015 06/29/2015  Decreased Interest 0 0  Down, Depressed, Hopeless 1 1  PHQ - 2 Score 1 1    Fall Risk: Fall Risk  11/17/2015 06/29/2015  Falls in the past year? No No    Functional Status Survey: Is the patient deaf or have difficulty hearing?: No Does the patient have difficulty seeing, even when wearing glasses/contacts?: Yes (reading glasses) Does the patient have difficulty concentrating, remembering, or making decisions?: No Does the  patient have difficulty walking or climbing stairs?: No Does the patient have difficulty dressing or bathing?: No Does the patient have difficulty doing errands alone such as visiting a doctor's office or shopping?: No    Assessment & Plan  1. Migraine without aura and responsive to treatment  Doing well, continue prn medication   2. Needs flu shot  - Flu Vaccine QUAD 36+ mos PF IM (Fluarix & Fluzone Quad PF)  3. Essential hypertension  - losartan-hydrochlorothiazide (HYZAAR) 100-25 MG tablet; Take 1 tablet by mouth daily.  Dispense: 90 tablet; Refill: 1 - Comprehensive metabolic panel - CBC with Differential/Platelet  4. Perennial allergic rhinitis  - montelukast (SINGULAIR) 10 MG tablet; Take 1 tablet (10 mg total) by mouth daily.  Dispense: 90 tablet; Refill: 1 - levocetirizine (XYZAL) 5 MG tablet; Take 1 tablet (5 mg total) by mouth daily.  Dispense: 90 tablet; Refill: 1 - EPINEPHrine (EPIPEN 2-PAK) 0.3 mg/0.3 mL IJ SOAJ injection; Inject 0.3 mLs (0.3 mg total) into the muscle once.  Dispense: 2 Device; Refill: 1  5. Restless leg  - Ambulatory referral to Neurology - Ferritin - Magnesium  6. Asthma, moderate persistent, with acute exacerbation  - montelukast (SINGULAIR) 10 MG tablet; Take 1 tablet (10 mg total) by mouth daily.  Dispense: 90 tablet; Refill: 1 - albuterol (PROAIR HFA) 108 (90 BASE) MCG/ACT inhaler; Inhale 2 puffs into the lungs every 4 (four) hours as needed.  Dispense: 1 Inhaler; Refill: 1  7. B12 deficiency  - Vitamin B12 Injection given today in the office  8. GAD (generalized anxiety disorder)  - escitalopram (LEXAPRO) 20 MG tablet; Take 1 tablet (20 mg total) by mouth daily.  Dispense: 90 tablet; Refill: 1 - ALPRAZolam (XANAX) 0.5 MG tablet; Take 1 tablet (0.5 mg total) by mouth 2 (two) times daily as needed.  Dispense: 60 tablet; Refill: 0  9. Peripheral polyneuropathy (HCC)   10. Mild major depression (HCC)  Doing well on Lexapro   11.  Left elbow pain  It may have been a tendon rupture, patient would like to hold off on referral to Ortho, states brace has helped with  symptoms  12. Lipid screening  - Lipid panel

## 2016-02-15 ENCOUNTER — Ambulatory Visit: Payer: Managed Care, Other (non HMO) | Admitting: Family Medicine

## 2016-04-14 ENCOUNTER — Other Ambulatory Visit: Payer: Self-pay | Admitting: Family Medicine

## 2016-04-14 NOTE — Telephone Encounter (Signed)
Patient requesting refill. 

## 2016-04-15 NOTE — Telephone Encounter (Signed)
Patient informed. She has appointment for 05-03-16

## 2016-04-15 NOTE — Telephone Encounter (Signed)
Left message for patient to return call.

## 2016-05-03 ENCOUNTER — Ambulatory Visit (INDEPENDENT_AMBULATORY_CARE_PROVIDER_SITE_OTHER): Payer: 59 | Admitting: Family Medicine

## 2016-05-03 ENCOUNTER — Encounter: Payer: Self-pay | Admitting: Family Medicine

## 2016-05-03 VITALS — BP 124/76 | HR 97 | Temp 98.3°F | Resp 16 | Ht 66.0 in | Wt 200.6 lb

## 2016-05-03 DIAGNOSIS — F320 Major depressive disorder, single episode, mild: Secondary | ICD-10-CM

## 2016-05-03 DIAGNOSIS — G43009 Migraine without aura, not intractable, without status migrainosus: Secondary | ICD-10-CM | POA: Diagnosis not present

## 2016-05-03 DIAGNOSIS — J3089 Other allergic rhinitis: Secondary | ICD-10-CM

## 2016-05-03 DIAGNOSIS — G2581 Restless legs syndrome: Secondary | ICD-10-CM

## 2016-05-03 DIAGNOSIS — F411 Generalized anxiety disorder: Secondary | ICD-10-CM | POA: Diagnosis not present

## 2016-05-03 DIAGNOSIS — I10 Essential (primary) hypertension: Secondary | ICD-10-CM

## 2016-05-03 DIAGNOSIS — J309 Allergic rhinitis, unspecified: Secondary | ICD-10-CM

## 2016-05-03 DIAGNOSIS — J4540 Moderate persistent asthma, uncomplicated: Secondary | ICD-10-CM | POA: Diagnosis not present

## 2016-05-03 MED ORDER — LEVOCETIRIZINE DIHYDROCHLORIDE 5 MG PO TABS: 5.0000 mg | ORAL_TABLET | Freq: Every day | ORAL | Status: DC

## 2016-05-03 MED ORDER — ALPRAZOLAM 0.5 MG PO TABS: 0.5000 mg | ORAL_TABLET | Freq: Two times a day (BID) | ORAL | Status: DC | PRN

## 2016-05-03 MED ORDER — ALBUTEROL SULFATE HFA 108 (90 BASE) MCG/ACT IN AERS: 2.0000 | INHALATION_SPRAY | RESPIRATORY_TRACT | Status: DC | PRN

## 2016-05-03 MED ORDER — ESCITALOPRAM OXALATE 10 MG PO TABS: 10.0000 mg | ORAL_TABLET | Freq: Every day | ORAL | Status: DC

## 2016-05-03 MED ORDER — LOSARTAN POTASSIUM-HCTZ 100-25 MG PO TABS: 1.0000 | ORAL_TABLET | Freq: Every day | ORAL | Status: DC

## 2016-05-03 MED ORDER — MONTELUKAST SODIUM 10 MG PO TABS: 10.0000 mg | ORAL_TABLET | Freq: Every day | ORAL | Status: DC

## 2016-05-03 MED ORDER — LOSARTAN POTASSIUM-HCTZ 50-12.5 MG PO TABS
1.0000 | ORAL_TABLET | Freq: Every day | ORAL | Status: DC
Start: 2016-05-03 — End: 2016-12-02

## 2016-05-03 NOTE — Progress Notes (Signed)
Name: Destiny Atkinson   MRN: [id number]    DOB: May 21, 1976   Date:05/03/2016       Progress Note  Subjective  Chief Complaint  Chief Complaint  Patient presents with  . Medication Refill    3 month F/U  . Hypertension    Dizziness, and restless leg syndrome  . Depression    Patient has been having to half her dosage due to less stress and did not need as high of a dosage with new job  . Anxiety    Panic attacks sometimes but not as much as she use too.   . Asthma    Will get flairs up with long walks and will take it as needed.   . Migraine    Patient has switched jobs and now working in [employer] for [employer] and loves it. Patient migraine have decreased significally due to less job stress.   . Allergic Rhinitis     Stable with medication    HPI  Migraine : no episodes since last visit about 6 months ago. She controls symptoms with Excedrin migraine and a nap, she does not want to change her regiment since symptoms have improved. She states she has phonophobia, and also photophobia. No nausea or vomiting. Migraine is described as a sharp pain over left eye and radiates to left temporal area.   HTN: taking medications and denies side effects, bp is under control.  No chest pain - unless if she has a panic attack, occasionally has palpitation, also related to anxiety, but resolves with deep slow breaths . She has noticed some orthostatic changes and we will decrease dose of medication.  Anxiety/Depression: she left [employer] and started a new job at [employer] in [place], she is currently taking half dose of Lexapro since stress level is lower and is doing well on 10 mg.  She states she has noticed improve in energy, no anhedonia, occasionally she has crying spells. She states it is more secondary to anxiety. She is taking Alprazolam prn, but is lasting longer.   RSL/Peripheral Neuropathy: She has a need to move her legs when sitting down, also states that at night her legs  moves constantly. She also states than when she stands up for a long time her legs feels sore/a discomfort sensation that goes from her buttocks down to her legs. She feels a prickling sensation from her feet to her toes when she first stands up. Requip stopped working and 3 months ago she was given Horizant but did not work even at the 600 mg dose. She has B12 deficiency and is taking otc supplementation, she was given a  referral to neurologist and EMG/NCS but she had to cancel secondary to new job.   Left elbow pain: resolved, with brace and rest  AR: she his allergic to cats, but has 3 cats at home, also allergic to dogs and has one of them, uses Rhinochort otc, xyzal and singulair. Still wakes up with puffy eyes every morning, she has rhinorrhea, nasal congestion, improves when she is not home. She does not want to get rid of her animals  Asthma: she still smokes, about 8 cigarettes day now, has a chronic cough , and daily wheezing, she sees Dr. Welton Flakes, she was on Breo, but currently only taking prn medication    Patient Active Problem List   Diagnosis Date Noted  . Mild major depression (HCC) 11/17/2015  . GAD (generalized anxiety disorder) 05/06/2015  . Asthma,  moderate persistent 05/06/2015  . Mobitz type I incomplete atrioventricular block 05/06/2015  . B12 deficiency 05/06/2015  . Essential (primary) hypertension 05/06/2015  . Irregular bleeding 05/06/2015  . Migraine without aura and responsive to treatment 05/06/2015  . Lack of erotic interest 05/06/2015  . Excess weight 05/06/2015  . Peripheral neuropathy (HCC) 05/06/2015  . Perennial allergic rhinitis 05/06/2015  . Restless leg 05/06/2015  . Tobacco abuse 05/06/2015    Past Surgical History  Procedure Laterality Date  . Nasal sinus surgery      Family History  Problem Relation Age of Onset  . COPD Mother   . Fibromyalgia Mother   . Hypertension Mother   . Stroke Mother   . Asthma Mother   . Arthritis Mother   .  Depression Mother   . Heart disease Mother   . Diabetes Father   . Epilepsy Brother     Social History   Social History  . Marital Status: Married    Spouse Name: N/A  . Number of Children: N/A  . Years of Education: N/A   Occupational History  . Not on file.   Social History Main Topics  . Smoking status: Current Every Day Smoker -- 0.50 packs/day for 20 years    Types: Cigarettes    Start date: 06/29/1995  . Smokeless tobacco: Never Used  . Alcohol Use: 0.0 oz/week    0 Standard drinks or equivalent per week     Comment: rarely  . Drug Use: No  . Sexual Activity:    Partners: Male    Birth Control/ Protection: Pill   Other Topics Concern  . Not on file   Social History Narrative     Current outpatient prescriptions:  .  albuterol (PROAIR HFA) 108 (90 Base) MCG/ACT inhaler, Inhale 2 puffs into the lungs every 4 (four) hours as needed., Disp: 1 Inhaler, Rfl: 0 .  ALPRAZolam (XANAX) 0.5 MG tablet, Take 1 tablet (0.5 mg total) by mouth 2 (two) times daily as needed., Disp: 30 tablet, Rfl: 0 .  EPINEPHrine (EPIPEN 2-PAK) 0.3 mg/0.3 mL IJ SOAJ injection, Inject 0.3 mLs (0.3 mg total) into the muscle once., Disp: 2 Device, Rfl: 1 .  escitalopram (LEXAPRO) 10 MG tablet, Take 1 tablet (10 mg total) by mouth daily., Disp: 90 tablet, Rfl: 1 .  ipratropium-albuterol (DUONEB) 0.5-2.5 (3) MG/3ML SOLN, Take 3 mLs by nebulization every 6 (six) hours as needed., Disp: , Rfl:  .  levocetirizine (XYZAL) 5 MG tablet, Take 1 tablet (5 mg total) by mouth daily., Disp: 90 tablet, Rfl: 1 .  losartan-hydrochlorothiazide (HYZAAR) 100-25 MG tablet, Take 1 tablet by mouth daily., Disp: 90 tablet, Rfl: 1 .  montelukast (SINGULAIR) 10 MG tablet, Take 1 tablet (10 mg total) by mouth daily., Disp: 90 tablet, Rfl: 1 .  norethindrone (MICRONOR,CAMILA,ERRIN) 0.35 MG tablet, Take 1 tablet by mouth daily., Disp: , Rfl:   Allergies  Allergen Reactions  . Dog Epithelium     Anaphylaxis with Rabbits.   Dogs, cats, birds cause swelling, extreme itching.  . Dust Mite Extract   . Pollen Extract   . Soap   . Tape   . Tree Extract      ROS  Constitutional: Negative for fever or weight change.  Respiratory: Negative for cough and shortness of breath.   Cardiovascular: Negative for chest pain or palpitations.  Gastrointestinal: Negative for abdominal pain, no bowel changes.  Musculoskeletal: Negative for gait problem or joint swelling.  Skin: Negative for rash.  Neurological: Negative for dizziness or headache.  No other specific complaints in a complete review of systems (except as listed in HPI above).  Objective  Filed Vitals:   05/03/16 1536  BP: 124/76  Pulse: 97  Temp: 98.3 F (36.8 C)  TempSrc: Oral  Resp: 16  Height:  (1.676 m)  Weight: 200 lb 9.6 oz (90.992 kg)  SpO2: 96%    Body mass index is 32.39 kg/(m^2).  Physical Exam  Constitutional: Patient appears well-developed and well-nourished. Obese  No distress.  HEENT: head atraumatic, normocephalic, pupils equal and reactive to light, neck supple, throat within normal limits Cardiovascular: Normal rate, regular rhythm and normal heart sounds.  No murmur heard. No BLE edema. Pulmonary/Chest: Effort normal and breath sounds normal. No respiratory distress. Abdominal: Soft.  There is no tenderness. Psychiatric: Patient has a normal mood and affect. behavior is normal. Judgment and thought content normal.  PHQ2/9: Depression screen [facility] 2/9 05/03/2016 11/17/2015 06/29/2015  Decreased Interest 0 0 0  Down, Depressed, Hopeless 0 1 1  PHQ - 2 Score 0 1 1    Fall Risk: Fall Risk  05/03/2016 11/17/2015 06/29/2015  Falls in the past year? No No No     Functional Status Survey: Is the patient deaf or have difficulty hearing?: No Does the patient have difficulty seeing, even when wearing glasses/contacts?: No Does the patient have difficulty concentrating, remembering, or making decisions?: No Does the patient  have difficulty walking or climbing stairs?: No Does the patient have difficulty dressing or bathing?: No Does the patient have difficulty doing errands alone such as visiting a doctor's office or shopping?: No    Assessment & Plan  1. Asthma, moderate persistent, uncomplicated  - montelukast (SINGULAIR) 10 MG tablet; Take 1 tablet (10 mg total) by mouth daily.  Dispense: 90 tablet; Refill: 1 - albuterol (PROAIR HFA) 108 (90 Base) MCG/ACT inhaler; Inhale 2 puffs into the lungs every 4 (four) hours as needed.  Dispense: 1 Inhaler; Refill: 0  2. Migraine without aura and responsive to treatment  Doing well, taking otc prn medication   3. Essential hypertension  - losartan-hydrochlorothiazide (HYZAAR) 100-25 MG tablet; Take 1 tablet by mouth daily.  Dispense: 90 tablet; Refill: 1  4. Mild major depression (HCC)  Doing well, on lower dose of Lexapro and is doing well  - escitalopram (LEXAPRO) 10 MG tablet; Take 1 tablet (10 mg total) by mouth daily.  Dispense: 90 tablet; Refill: 1  5. Restless leg  Needs to follow up with Neurologist  6. GAD (generalized anxiety disorder)  - escitalopram (LEXAPRO) 10 MG tablet; Take 1 tablet (10 mg total) by mouth daily.  Dispense: 90 tablet; Refill: 1 - ALPRAZolam (XANAX) 0.5 MG tablet; Take 1 tablet (0.5 mg total) by mouth 2 (two) times daily as needed.  Dispense: 30 tablet; Refill: 0  7. Perennial allergic rhinitis  - montelukast (SINGULAIR) 10 MG tablet; Take 1 tablet (10 mg total) by mouth daily.  Dispense: 90 tablet; Refill: 1 - levocetirizine (XYZAL) 5 MG tablet; Take 1 tablet (5 mg total) by mouth daily.  Dispense: 90 tablet; Refill: 1

## 2016-08-05 ENCOUNTER — Ambulatory Visit (INDEPENDENT_AMBULATORY_CARE_PROVIDER_SITE_OTHER): Payer: 59 | Admitting: Family Medicine

## 2016-08-23 ENCOUNTER — Ambulatory Visit: Payer: 59 | Admitting: Family Medicine

## 2016-11-19 ENCOUNTER — Other Ambulatory Visit: Payer: Self-pay | Admitting: Family Medicine

## 2016-11-19 DIAGNOSIS — I10 Essential (primary) hypertension: Secondary | ICD-10-CM

## 2016-11-19 DIAGNOSIS — F411 Generalized anxiety disorder: Secondary | ICD-10-CM

## 2016-11-19 DIAGNOSIS — J4540 Moderate persistent asthma, uncomplicated: Secondary | ICD-10-CM

## 2016-12-02 ENCOUNTER — Other Ambulatory Visit: Payer: Self-pay | Admitting: Family Medicine

## 2016-12-02 DIAGNOSIS — I10 Essential (primary) hypertension: Secondary | ICD-10-CM

## 2016-12-02 DIAGNOSIS — F411 Generalized anxiety disorder: Secondary | ICD-10-CM

## 2016-12-02 MED ORDER — LOSARTAN POTASSIUM-HCTZ 50-12.5 MG PO TABS: 1.0000 | ORAL_TABLET | Freq: Every day | ORAL | 0 refills | Status: DC

## 2016-12-02 NOTE — Telephone Encounter (Signed)
Pt scheduled appt for 01-27-17, she is asking for a refill on Losartan and alprazolam. [facility]

## 2016-12-20 ENCOUNTER — Telehealth: Payer: Self-pay | Admitting: Family Medicine

## 2016-12-20 DIAGNOSIS — F411 Generalized anxiety disorder: Secondary | ICD-10-CM

## 2016-12-23 NOTE — Telephone Encounter (Signed)
Pt has appointment for 01-27-17. She states she is beginning to have small attacks and would like to know if you could please give her enough to last until her appointment.

## 2016-12-28 ENCOUNTER — Ambulatory Visit (INDEPENDENT_AMBULATORY_CARE_PROVIDER_SITE_OTHER): Payer: 59 | Admitting: Family Medicine

## 2016-12-28 ENCOUNTER — Encounter: Payer: Self-pay | Admitting: Family Medicine

## 2016-12-28 VITALS — BP 138/74 | HR 110 | Temp 99.9°F | Resp 18 | Ht 66.0 in | Wt 196.8 lb

## 2016-12-28 DIAGNOSIS — G43009 Migraine without aura, not intractable, without status migrainosus: Secondary | ICD-10-CM | POA: Diagnosis not present

## 2016-12-28 DIAGNOSIS — Z6831 Body mass index (BMI) 31.0-31.9, adult: Secondary | ICD-10-CM

## 2016-12-28 DIAGNOSIS — I10 Essential (primary) hypertension: Secondary | ICD-10-CM

## 2016-12-28 DIAGNOSIS — J3089 Other allergic rhinitis: Secondary | ICD-10-CM | POA: Diagnosis not present

## 2016-12-28 DIAGNOSIS — F321 Major depressive disorder, single episode, moderate: Secondary | ICD-10-CM

## 2016-12-28 DIAGNOSIS — G629 Polyneuropathy, unspecified: Secondary | ICD-10-CM

## 2016-12-28 DIAGNOSIS — F411 Generalized anxiety disorder: Secondary | ICD-10-CM | POA: Diagnosis not present

## 2016-12-28 DIAGNOSIS — E538 Deficiency of other specified B group vitamins: Secondary | ICD-10-CM

## 2016-12-28 DIAGNOSIS — J4540 Moderate persistent asthma, uncomplicated: Secondary | ICD-10-CM

## 2016-12-28 DIAGNOSIS — E559 Vitamin D deficiency, unspecified: Secondary | ICD-10-CM | POA: Insufficient documentation

## 2016-12-28 DIAGNOSIS — G2581 Restless legs syndrome: Secondary | ICD-10-CM

## 2016-12-28 MED ORDER — ESCITALOPRAM OXALATE 10 MG PO TABS: 10.0000 mg | ORAL_TABLET | Freq: Every day | ORAL | 0 refills | Status: DC

## 2016-12-28 MED ORDER — LOSARTAN POTASSIUM-HCTZ 50-12.5 MG PO TABS: 1.0000 | ORAL_TABLET | Freq: Every day | ORAL | 0 refills | Status: DC

## 2016-12-28 MED ORDER — RIZATRIPTAN BENZOATE 10 MG PO TBDP: 10.0000 mg | ORAL_TABLET | ORAL | 0 refills | Status: DC | PRN

## 2016-12-28 MED ORDER — BUDESONIDE-FORMOTEROL FUMARATE 160-4.5 MCG/ACT IN AERO: 2.0000 | INHALATION_SPRAY | Freq: Two times a day (BID) | RESPIRATORY_TRACT | 2 refills | Status: DC

## 2016-12-28 MED ORDER — ESCITALOPRAM OXALATE 10 MG PO TABS: 10.0000 mg | ORAL_TABLET | Freq: Every day | ORAL | 1 refills | Status: DC

## 2016-12-28 MED ORDER — ALBUTEROL SULFATE HFA 108 (90 BASE) MCG/ACT IN AERS: 2.0000 | INHALATION_SPRAY | RESPIRATORY_TRACT | 0 refills | Status: DC | PRN

## 2016-12-28 MED ORDER — MONTELUKAST SODIUM 10 MG PO TABS: 10.0000 mg | ORAL_TABLET | Freq: Every day | ORAL | 1 refills | Status: DC

## 2016-12-28 MED ORDER — ALPRAZOLAM 0.5 MG PO TABS: 0.5000 mg | ORAL_TABLET | Freq: Two times a day (BID) | ORAL | 0 refills | Status: DC | PRN

## 2016-12-28 MED ORDER — LEVOCETIRIZINE DIHYDROCHLORIDE 5 MG PO TABS: 5.0000 mg | ORAL_TABLET | Freq: Every day | ORAL | 1 refills | Status: DC

## 2016-12-28 NOTE — Progress Notes (Signed)
Name: Destiny Atkinson   MRN: [id number]    DOB: 06-02-1976   Date:12/28/2016       Progress Note  Subjective  Chief Complaint  Chief Complaint  Patient presents with  . Medication Refill    6 month F/U  . Asthma    Wheezing alot lately  . Anxiety    Has been very stressed at work and ran out of medication.  . Migraine    Has had a migraine since Monday and Ibuprofen is not relieving symptoms. Blurred vision, neck tension, on left side pain radiates to back of head.   . Hypertension    Fingers have been swelling more  . Allergic Rhinitis     Stable, taking medication daily for relief.    HPI  Migraine : she was doing well, however over the past 4 weeks she has been having migraines about once every two weeks, secondary to increase stress at work. She missed work for the past 2 days. . She states she has phonophobia, and also photophobia. No nausea or vomiting. Migraine is described as a sharp pain over left eye and radiates to left temporal area and sometimes nuchal area. She has not tried Excedrin migraine because she ran out of medication.   HTN: taking medications and denies side effects, bp is under control.  No chest pain - unless if she has a panic attack, occasionally has palpitation, also related to anxiety, but resolves with deep slow breaths. She states dizziness resolved when we went down on bp medication dose  Anxiety/Depression: she left [employer] and started a new job at [employer] in [place] back in May 2017 and was doing well and able to go down to 10 mg of Lexapro, however over the past 6 months she has been harassed by her supervisor, and got written up twice over the past two months.  She is feeling anxious and worries about losing her job, even though she knows she has great experience and she has been written up for things that she has not done wrong. Advised her to see therapist and she will find out who is part of her network   RLS/Peripheral Neuropathy: She has  a need to move her legs when sitting down, also states that at night her legs moves constantly. She also states than when she stands up for a long time her legs feels sore/a discomfort sensation that goes from her buttocks down to her legs. She states prickling sensation from her feet to her toes has resolved. Requip stopped working and 3 months ago she was given Horizant but did not work even at the 600 mg dose.  she was given a  referral to neurologist and EMG/NCS but she had to cancel secondary to new job (started May 2017).   AR: she his allergic to cats, but has 3 cats at home, also allergic to dogs and has one of them, uses Rhinochort otc, xyzal and singulair. Still wakes up with puffy eyes every morning, she has rhinorrhea, nasal congestion, improves when she is not home. She does not want to get rid of her animals, and does not have time to have allergy shots  Asthma: she still smokes, about 8 cigarettes day now, has a chronic cough , and daily wheezing, she used to see  Dr. Welton FlakesKhan, she was on Breo, but it caused oral candidiasis. She has used Symbicort in the past and liked it. She states she has noticed worsening of symptoms lately and  willing to resume daily medication.  Patient Active Problem List   Diagnosis Date Noted  . Mild major depression (HCC) 11/17/2015  . GAD (generalized anxiety disorder) 05/06/2015  . Asthma, moderate persistent 05/06/2015  . Mobitz type I incomplete atrioventricular block 05/06/2015  . B12 deficiency 05/06/2015  . Essential (primary) hypertension 05/06/2015  . Irregular bleeding 05/06/2015  . Migraine without aura and responsive to treatment 05/06/2015  . Lack of erotic interest 05/06/2015  . BMI 31.0-31.9,adult 05/06/2015  . Peripheral neuropathy (HCC) 05/06/2015  . Perennial allergic rhinitis 05/06/2015  . Restless leg 05/06/2015  . Tobacco abuse 05/06/2015    Past Surgical History:  Procedure Laterality Date  . NASAL SINUS SURGERY      Family  History  Problem Relation Age of Onset  . COPD Mother   . Fibromyalgia Mother   . Hypertension Mother   . Stroke Mother   . Asthma Mother   . Arthritis Mother   . Depression Mother   . Heart disease Mother   . Diabetes Father   . Epilepsy Brother     Social History   Social History  . Marital status: Married    Spouse name: N/A  . Number of children: N/A  . Years of education: N/A   Occupational History  . Not on file.   Social History Main Topics  . Smoking status: Current Every Day Smoker    Packs/day: 0.50    Years: 20.00    Types: Cigarettes    Start date: 06/29/1995  . Smokeless tobacco: Never Used  . Alcohol use 0.0 oz/week     Comment: rarely  . Drug use: No  . Sexual activity: Yes    Partners: Male    Birth control/ protection: Pill   Other Topics Concern  . 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      Current Outpatient Prescriptions:  .  albuterol (PROAIR HFA) 108 (90 Base) MCG/ACT inhaler, Inhale 2 puffs into the lungs every 4 (four) hours as needed., Disp: 1 Inhaler, Rfl: 0 .  ALPRAZolam (XANAX) 0.5 MG tablet, Take 1 tablet (0.5 mg total) by mouth 2 (two) times daily as needed., Disp: 30 tablet, Rfl: 0 .  EPINEPHrine (EPIPEN 2-PAK) 0.3 mg/0.3 mL IJ SOAJ injection, Inject 0.3 mLs (0.3 mg total) into the muscle once., Disp: 2 Device, Rfl: 1 .  escitalopram (LEXAPRO) 10 MG tablet, Take 1 tablet (10 mg total) by mouth daily., Disp: 90 tablet, Rfl: 1 .  ipratropium-albuterol (DUONEB) 0.5-2.5 (3) MG/3ML SOLN, Take 3 mLs by nebulization every 6 (six) hours as needed., Disp: , Rfl:  .  levocetirizine (XYZAL) 5 MG tablet, Take 1 tablet (5 mg total) by mouth daily., Disp: 90 tablet, Rfl: 1 .  losartan-hydrochlorothiazide (HYZAAR) 50-12.5 MG tablet, Take 1 tablet by mouth daily., Disp: 90 tablet, Rfl: 0 .  montelukast (SINGULAIR) 10 MG tablet, Take 1 tablet (10 mg  total) by mouth daily., Disp: 90 tablet, Rfl: 1 .  norethindrone (MICRONOR,CAMILA,ERRIN) 0.35 MG tablet, Take 1 tablet by mouth daily., Disp: , Rfl:  .  budesonide-formoterol (SYMBICORT) 160-4.5 MCG/ACT inhaler, Inhale 2 puffs into the lungs 2 (two) times daily., Disp: 1 Inhaler, Rfl: 2 .  rizatriptan (MAXALT-MLT) 10 MG disintegrating tablet, Take 1 tablet (10 mg total) by mouth as needed for migraine. May repeat in 2 hours if needed, Disp: 10 tablet, Rfl: 0  Allergies  Allergen Reactions  . Dog Epithelium     Anaphylaxis with Rabbits.  Dogs, cats, birds cause swelling, extreme itching.  . Dust Mite Extract   . Pollen Extract   . Soap   . Tape   . Tree Extract      ROS  Constitutional: Negative for fever, positive for weight change.  Respiratory: Negative for cough and shortness of breath.   Cardiovascular: Negative for chest pain or palpitations.  Gastrointestinal: Negative for abdominal pain, no bowel changes.  Musculoskeletal: Negative for gait problem or joint swelling.  Skin: Negative for rash.  Neurological: Negative for dizziness, positive for headache.  No other specific complaints in a complete review of systems (except as listed in HPI above).  Objective  Vitals:   12/28/16 1325  BP: 138/74  Pulse: (!) 110  Resp: 18  Temp: 99.9 F (37.7 C)  TempSrc: Oral  SpO2: 98%  Weight: 196 lb 12.8 oz (89.3 kg)  Height: 5\' 6"  (1.676 m)    Body mass index is 31.76 kg/m.  Physical Exam  Constitutional: Patient appears well-developed and well-nourished. Obese  No distress.  HEENT: head atraumatic, normocephalic, pupils equal and reactive to light,  neck supple, throat within normal limits Cardiovascular: Normal rate, regular rhythm and normal heart sounds.  No murmur heard. No BLE edema. Pulmonary/Chest: Effort normal and breath sounds normal. No respiratory distress. Abdominal: Soft.  There is no tenderness. Psychiatric: Patient has a normal mood and affect. behavior  is normal. Judgment and thought content normal. Neurological : no focal findings.   PHQ2/9: Depression screen [facility] 2/9 12/28/2016 05/03/2016 11/17/2015 06/29/2015  Decreased Interest 1 0 0 0  Down, Depressed, Hopeless 1 0 1 1  PHQ - 2 Score 2 0 1 1  Altered sleeping 3 - - -  Tired, decreased energy 2 - - -  Change in appetite 0 - - -  Feeling bad or failure about yourself  1 - - -  Trouble concentrating 1 - - -  Moving slowly or fidgety/restless 3 - - -  Suicidal thoughts 0 - - -  PHQ-9 Score 12 - - -  Difficult doing work/chores Somewhat difficult - - -     Fall Risk: Fall Risk  12/28/2016 05/03/2016 11/17/2015 06/29/2015  Falls in the past year? No No No No    Functional Status Survey: Is the patient deaf or have difficulty hearing?: No Does the patient have difficulty seeing, even when wearing glasses/contacts?: No Does the patient have difficulty concentrating, remembering, or making decisions?: No Does the patient have difficulty walking or climbing stairs?: No Does the patient have difficulty dressing or bathing?: No Does the patient have difficulty doing errands alone such as visiting a doctor's office or shopping?: No    Assessment & Plan  1. Perennial allergic rhinitis  - montelukast (SINGULAIR) 10 MG tablet; Take 1 tablet (10 mg total) by mouth daily.  Dispense: 90 tablet; Refill: 1 - levocetirizine (XYZAL) 5 MG tablet; Take 1 tablet (5 mg total) by mouth daily.  Dispense: 90 tablet; Refill: 1  2. GAD (generalized anxiety disorder)  - escitalopram (LEXAPRO) 10 MG tablet; Take 1 tablet (10 mg total) by mouth daily.  Dispense: 90 tablet; Refill: 1 - ALPRAZolam (XANAX) 0.5 MG tablet; Take 1 tablet (0.5 mg total) by mouth 2 (two) times daily as needed.  Dispense: 30 tablet; Refill: 0  3. Migraine without aura and responsive to treatment  She takes prn Excedrin prn  - rizatriptan (MAXALT-MLT) 10  MG disintegrating tablet; Take 1 tablet (10 mg total) by mouth as needed for  migraine. May repeat in 2 hours if needed  Dispense: 10 tablet; Refill: 0  4. Essential hypertension  - CBC with Differential/Platelet - COMPLETE METABOLIC PANEL WITH GFR - losartan-hydrochlorothiazide (HYZAAR) 50-12.5 MG tablet; Take 1 tablet by mouth daily.  Dispense: 90 tablet; Refill: 0  5. B12 deficiency  - Vitamin B12  6. Peripheral polyneuropathy (HCC)  Check labs  7. Moderate major depression (HCC)  She is getting more anxious and depressed because of work, she does not like her boss, Depression score is higher - escitalopram (LEXAPRO) 10 MG tablet; Take 1.5 tablet (10 mg total) by mouth daily.  Dispense: 135  tablet; Refill: 0  8. Restless leg  - Magnesium - Ferritin She failed Requip and Gabapentin  9. Vitamin D deficiency  - VITAMIN D 25 Hydroxy (Vit-D Deficiency, Fractures)  10. BMI 31.0-31.9,adult  - Hemoglobin A1c - Insulin, fasting  11. Moderate persistent asthma without complication  - budesonide-formoterol (SYMBICORT) 160-4.5 MCG/ACT inhaler; Inhale 2 puffs into the lungs 2 (two) times daily.  Dispense: 1 Inhaler; Refill: 2

## 2016-12-29 LAB — HEMOGLOBIN A1C
Hgb A1c MFr Bld: 5.8 % — ABNORMAL HIGH (ref <5.7)
MEAN PLASMA GLUCOSE: 120 mg/dL

## 2016-12-29 LAB — CBC WITH DIFFERENTIAL/PLATELET
BASOS PCT: 0 %
Basophils Absolute: 0 cells/uL (ref 0–200)
EOS ABS: 216 {cells}/uL (ref 15–500)
Eosinophils Relative: 3 %
HCT: 41.2 % (ref 35.0–45.0)
Hemoglobin: 13.6 g/dL (ref 11.7–15.5)
Lymphocytes Relative: 36 %
Lymphs Abs: 2592 cells/uL (ref 850–3900)
MCH: 31.3 pg (ref 27.0–33.0)
MCHC: 33 g/dL (ref 32.0–36.0)
MCV: 94.7 fL (ref 80.0–100.0)
MONO ABS: 648 {cells}/uL (ref 200–950)
MPV: 9.5 fL (ref 7.5–12.5)
Monocytes Relative: 9 %
NEUTROS ABS: 3744 {cells}/uL (ref 1500–7800)
Neutrophils Relative %: 52 %
Platelets: 295 10*3/uL (ref 140–400)
RBC: 4.35 MIL/uL (ref 3.80–5.10)
RDW: 13.4 % (ref 11.0–15.0)
WBC: 7.2 10*3/uL (ref 3.8–10.8)

## 2016-12-29 LAB — INSULIN, FASTING: Insulin fasting, serum: 234.6 u[IU]/mL — ABNORMAL HIGH (ref 2.0–19.6)

## 2016-12-29 LAB — FERRITIN: Ferritin: 16 ng/mL (ref 10–154)

## 2016-12-30 LAB — VITAMIN B12: VITAMIN B 12: 324 pg/mL (ref 200–1100)

## 2016-12-31 LAB — COMPLETE METABOLIC PANEL WITH GFR
ALT: 14 U/L (ref 6–29)
AST: 18 U/L (ref 10–30)
Albumin: 4.6 g/dL (ref 3.6–5.1)
Alkaline Phosphatase: 70 U/L (ref 33–115)
BILIRUBIN TOTAL: 0.5 mg/dL (ref 0.2–1.2)
BUN: 10 mg/dL (ref 7–25)
CHLORIDE: 101 mmol/L (ref 98–110)
CO2: 26 mmol/L (ref 20–31)
CREATININE: 0.97 mg/dL (ref 0.50–1.10)
Calcium: 9.2 mg/dL (ref 8.6–10.2)
GFR, EST AFRICAN AMERICAN: 85 mL/min (ref >=60)
GFR, Est Non African American: 74 mL/min (ref >=60)
GLUCOSE: 159 mg/dL — AB (ref 65–99)
Potassium: 4 mmol/L (ref 3.5–5.3)
Sodium: 137 mmol/L (ref 135–146)
TOTAL PROTEIN: 6.9 g/dL (ref 6.1–8.1)

## 2016-12-31 LAB — MAGNESIUM: MAGNESIUM: 2 mg/dL (ref 1.5–2.5)

## 2016-12-31 LAB — VITAMIN D 25 HYDROXY (VIT D DEFICIENCY, FRACTURES): Vit D, 25-Hydroxy: 16 ng/mL — ABNORMAL LOW (ref 30–100)

## 2017-01-03 ENCOUNTER — Other Ambulatory Visit: Payer: Self-pay | Admitting: Family Medicine

## 2017-01-03 MED ORDER — VITAMIN D (ERGOCALCIFEROL) 1.25 MG (50000 UNIT) PO CAPS: 50000.0000 [IU] | ORAL_CAPSULE | ORAL | 0 refills | Status: DC

## 2017-01-27 ENCOUNTER — Other Ambulatory Visit: Payer: Self-pay | Admitting: Family Medicine

## 2017-01-27 ENCOUNTER — Ambulatory Visit: Payer: 59 | Admitting: Family Medicine

## 2017-01-27 DIAGNOSIS — J3089 Other allergic rhinitis: Secondary | ICD-10-CM

## 2017-02-03 ENCOUNTER — Other Ambulatory Visit: Payer: Self-pay | Admitting: Family Medicine

## 2017-02-03 DIAGNOSIS — I10 Essential (primary) hypertension: Secondary | ICD-10-CM

## 2017-02-05 ENCOUNTER — Other Ambulatory Visit: Payer: Self-pay | Admitting: Family Medicine

## 2017-02-05 DIAGNOSIS — I10 Essential (primary) hypertension: Secondary | ICD-10-CM

## 2017-02-05 MED ORDER — LOSARTAN POTASSIUM-HCTZ 50-12.5 MG PO TABS: 1.0000 | ORAL_TABLET | Freq: Every day | ORAL | 0 refills | Status: DC

## 2017-03-05 ENCOUNTER — Other Ambulatory Visit: Payer: Self-pay | Admitting: Family Medicine

## 2017-03-05 DIAGNOSIS — J3089 Other allergic rhinitis: Secondary | ICD-10-CM

## 2017-03-08 ENCOUNTER — Other Ambulatory Visit: Payer: Self-pay | Admitting: Family Medicine

## 2017-03-08 ENCOUNTER — Telehealth: Payer: Self-pay | Admitting: Family Medicine

## 2017-03-08 DIAGNOSIS — J3089 Other allergic rhinitis: Secondary | ICD-10-CM

## 2017-03-08 NOTE — Telephone Encounter (Signed)
Done

## 2017-03-08 NOTE — Telephone Encounter (Signed)
Pt is asking that [facility] completely [facility] list she is only using [facility]. Thank you

## 2017-03-09 ENCOUNTER — Other Ambulatory Visit: Payer: Self-pay | Admitting: Family Medicine

## 2017-03-30 ENCOUNTER — Ambulatory Visit: Payer: 59 | Admitting: Family Medicine

## 2017-04-05 ENCOUNTER — Encounter: Payer: Self-pay | Admitting: Family Medicine

## 2017-04-05 ENCOUNTER — Ambulatory Visit (INDEPENDENT_AMBULATORY_CARE_PROVIDER_SITE_OTHER): Payer: Self-pay | Admitting: Family Medicine

## 2017-04-05 VITALS — BP 138/62 | HR 101 | Temp 97.9°F | Resp 16 | Ht 66.0 in | Wt 199.5 lb

## 2017-04-05 DIAGNOSIS — J209 Acute bronchitis, unspecified: Secondary | ICD-10-CM

## 2017-04-05 DIAGNOSIS — R05 Cough: Secondary | ICD-10-CM

## 2017-04-05 DIAGNOSIS — J0110 Acute frontal sinusitis, unspecified: Secondary | ICD-10-CM

## 2017-04-05 DIAGNOSIS — R059 Cough, unspecified: Secondary | ICD-10-CM

## 2017-04-05 MED ORDER — PREDNISONE 20 MG PO TABS: 20.0000 mg | ORAL_TABLET | Freq: Every day | ORAL | 0 refills | Status: DC

## 2017-04-05 MED ORDER — HYDROCOD POLST-CPM POLST ER 10-8 MG/5ML PO SUER: 5.0000 mL | Freq: Every evening | ORAL | 0 refills | Status: DC | PRN

## 2017-04-05 MED ORDER — AMOXICILLIN-POT CLAVULANATE 875-125 MG PO TABS: 1.0000 | ORAL_TABLET | Freq: Two times a day (BID) | ORAL | 0 refills | Status: AC

## 2017-04-05 NOTE — Patient Instructions (Addendum)
Please buy a probiotic or eat probiotic yogurt throughout the course of the antibiotic and for 1 week afterwards.   Acute Bronchitis, Adult Acute bronchitis is when air tubes (bronchi) in the lungs suddenly get swollen. The condition can make it hard to breathe. It can also cause these symptoms:  A cough.  Coughing up clear, yellow, or green mucus.  Wheezing.  Chest congestion.  Shortness of breath.  A fever.  Body aches.  Chills.  A sore throat. Follow these instructions at home: Medicines   Take over-the-counter and prescription medicines only as told by your doctor.  If you were prescribed an antibiotic medicine, take it as told by your doctor. Do not stop taking the antibiotic even if you start to feel better. General instructions   Rest.  Drink enough fluids to keep your pee (urine) clear or pale yellow.  Avoid smoking and secondhand smoke. If you smoke and you need help quitting, ask your doctor. Quitting will help your lungs heal faster.  Use an inhaler, cool mist vaporizer, or humidifier as told by your doctor.  Keep all follow-up visits as told by your doctor. This is important. How is this prevented? To lower your risk of getting this condition again:  Wash your hands often with soap and water. If you cannot use soap and water, use hand sanitizer.  Avoid contact with people who have cold symptoms.  Try not to touch your hands to your mouth, nose, or eyes.  Make sure to get the flu shot every year. Contact a doctor if:  Your symptoms do not get better in 2 weeks. Get help right away if:  You cough up blood.  You have chest pain.  You have very bad shortness of breath.  You become dehydrated.  You faint (pass out) or keep feeling like you are going to pass out.  You keep throwing up (vomiting).  You have a very bad headache.  Your fever or chills gets worse. This information is not intended to replace advice given to you by your health  care provider. Make sure you discuss any questions you have with your health care provider. Document Released: 04/25/2008 Document Revised: 06/15/2016 Document Reviewed: 04/27/2016 Elsevier Interactive Patient Education  2017 [employer]  Sinusitis, Adult Sinusitis is soreness and inflammation of your sinuses. Sinuses are hollow spaces in the bones around your face. They are located:  Around your eyes.  In the middle of your forehead.  Behind your nose.  In your cheekbones. Your sinuses and nasal passages are lined with a stringy fluid (mucus). Mucus normally drains out of your sinuses. When your nasal tissues get inflamed or swollen, the mucus can get trapped or blocked so air cannot flow through your sinuses. This lets bacteria, viruses, and funguses grow, and that leads to infection. Follow these instructions at home: Medicines   Take, use, or apply over-the-counter and prescription medicines only as told by your doctor. These may include nasal sprays.  If you were prescribed an antibiotic medicine, take it as told by your doctor. Do not stop taking the antibiotic even if you start to feel better. Hydrate and Humidify   Drink enough water to keep your pee (urine) clear or pale yellow.  Use a cool mist humidifier to keep the humidity level in your home above 50%.  Breathe in steam for 10-15 minutes, 3-4 times a day or as told by your doctor. You can do this in the bathroom while a hot shower is running.  Try not to spend time in cool or dry air. Rest   Rest as much as possible.  Sleep with your head raised (elevated).  Make sure to get enough sleep each night. General instructions   Put a warm, moist washcloth on your face 3-4 times a day or as told by your doctor. This will help with discomfort.  Wash your hands often with soap and water. If there is no soap and water, use hand sanitizer.  Do not smoke. Avoid being around people who are smoking (secondhand  smoke).  Keep all follow-up visits as told by your doctor. This is important. Contact a doctor if:  You have a fever.  Your symptoms get worse.  Your symptoms do not get better within 10 days. Get help right away if:  You have a very bad headache.  You cannot stop throwing up (vomiting).  You have pain or swelling around your face or eyes.  You have trouble seeing.  You feel confused.  Your neck is stiff.  You have trouble breathing. This information is not intended to replace advice given to you by your health care provider. Make sure you discuss any questions you have with your health care provider. Document Released: 04/25/2008 Document Revised: 07/03/2016 Document Reviewed: 09/02/2015 Elsevier Interactive Patient Education  2017 [employer].

## 2017-04-05 NOTE — Progress Notes (Addendum)
Name: Destiny Atkinson   MRN: [id number]    DOB: [birth year]   Date:04/05/2017       Progress Note  Subjective  Chief Complaint  Chief Complaint  Patient presents with  . Acute Visit    Possible bronchitis  . Cough    congestion and rattling in chest x10 days    HPI   Pt presents with 11 day history of non-productive cough, sinus pain/pressure, and wheezing. She endorses shortness of breath, hoarse voice, nasal congestion, and ear pressure, temp 100.56F at home. Denies chest pain, no NVD. Smokes 5 cigarettes per day - has been trying to taper down to quit. Has breathing treatment - using Ventolin inhaler at home PRN, using singulair, symbicort, and Xyzal, also Albuterol-Atrovent treatments at home PRN.  Patient Active Problem List   Diagnosis Date Noted  . Vitamin D deficiency 12/28/2016  . Mild major depression (HCC) 11/17/2015  . GAD (generalized anxiety disorder) 05/06/2015  . Asthma, moderate persistent 05/06/2015  . Mobitz type I incomplete atrioventricular block 05/06/2015  . B12 deficiency 05/06/2015  . Essential (primary) hypertension 05/06/2015  . Irregular bleeding 05/06/2015  . Migraine without aura and responsive to treatment 05/06/2015  . Lack of erotic interest 05/06/2015  . BMI 31.0-31.9,adult 05/06/2015  . Peripheral neuropathy 05/06/2015  . Perennial allergic rhinitis 05/06/2015  . Restless leg 05/06/2015  . Tobacco abuse 05/06/2015    Social History  Substance Use Topics  . Smoking status: Current Every Day Smoker    Packs/day: 0.50    Years: 20.00    Types: Cigarettes    Start date: 06/29/1995  . Smokeless tobacco: Never Used  . Alcohol use 0.0 oz/week     Comment: rarely     Current Outpatient Prescriptions:  .  albuterol (PROAIR HFA) 108 (90 Base) MCG/ACT inhaler, Inhale 2 puffs into the lungs every 4 (four) hours as needed., Disp: 1 Inhaler, Rfl: 0 .  ALPRAZolam (XANAX) 0.5 MG tablet, Take 1 tablet (0.5 mg total) by mouth 2 (two) times daily as  needed., Disp: 30 tablet, Rfl: 0 .  budesonide-formoterol (SYMBICORT) 160-4.5 MCG/ACT inhaler, Inhale 2 puffs into the lungs 2 (two) times daily., Disp: 1 Inhaler, Rfl: 2 .  EPINEPHrine (EPIPEN 2-PAK) 0.3 mg/0.3 mL IJ SOAJ injection, Inject 0.3 mLs (0.3 mg total) into the muscle once., Disp: 2 Device, Rfl: 1 .  escitalopram (LEXAPRO) 10 MG tablet, Take 1-1.5 tablets (10-15 mg total) by mouth daily., Disp: 135 tablet, Rfl: 0 .  ipratropium-albuterol (DUONEB) 0.5-2.5 (3) MG/3ML SOLN, Take 3 mLs by nebulization every 6 (six) hours as needed., Disp: , Rfl:  .  levocetirizine (XYZAL) 5 MG tablet, TAKE ONE TABLET BY MOUTH DAILY, Disp: 30 tablet, Rfl: 0 .  losartan-hydrochlorothiazide (HYZAAR) 50-12.5 MG tablet, Take 1 tablet by mouth daily., Disp: 90 tablet, Rfl: 0 .  montelukast (SINGULAIR) 10 MG tablet, TAKE 1 TABLET (10 MG TOTAL) BY MOUTH DAILY., Disp: 90 tablet, Rfl: 0 .  norethindrone (MICRONOR,CAMILA,ERRIN) 0.35 MG tablet, Take 1 tablet by mouth daily., Disp: , Rfl:  .  rizatriptan (MAXALT-MLT) 10 MG disintegrating tablet, Take 1 tablet (10 mg total) by mouth as needed for migraine. May repeat in 2 hours if needed, Disp: 10 tablet, Rfl: 0  Allergies  Allergen Reactions  . Dog Epithelium     Anaphylaxis with Rabbits.  Dogs, cats, birds cause swelling, extreme itching.  . Dust Mite Extract   . Pollen Extract   . Soap   . Tape   . Tree Extract  ROS  Constitutional: Negative for fever or weight change.  HEENT: See HPI Respiratory: Positive for cough and shortness of breath.   Cardiovascular: Negative for chest pain or palpitations.  Gastrointestinal: Negative for abdominal pain, no bowel changes.  Musculoskeletal: Negative for gait problem or joint swelling.  Skin: Negative for rash.  Neurological: Negative for dizziness or headache.  No other specific complaints in a complete review of systems (except as listed in HPI above).  Objective  Vitals:   04/05/17 1009  BP: 138/62   Pulse: (!) 101  Resp: 16  Temp: 97.9 F (36.6 C)  TempSrc: Oral  SpO2: 97%  Weight: 199 lb 8 oz (90.5 kg)  Height: 5\' 6"  (1.676 m)    Body mass index is 32.2 kg/m.  Nursing Note and Vital Signs reviewed.  Physical Exam Constitutional: Patient appears well-developed and well-nourished. Obese No distress.  HEENT: head atraumatic, normocephalic, pupils equal and reactive to light, EOM's intact, TM's without erythema or bulging, no maxillary sinus pain; positive  for frontal sinus pain on palpation, Nasal turbinates inflamed; neck supple without lymphadenopathy, oropharynx red and moist without exudate Cardiovascular: Normal rate - 96bpm on auscultation, regular rhythm, S1/S2 present.  No murmur or rub heard. No BLE edema. Pulmonary/Chest: Effort normal and breath sounds slightly diminished throughout with expiratory wheezes, patient coughing throughout examination with bronchospasm present. No respiratory distress or retractions. Abdominal: Soft and non-tender, bowel sounds present x4 quadrants. Psychiatric: Patient has a normal mood and affect. behavior is normal. Judgment and thought content normal.  No results found for this or any previous visit (from the past 2160 hour(s)).  Assessment & Plan 1. Acute non-recurrent frontal sinusitis - amoxicillin-clavulanate (AUGMENTIN) 875-125 MG tablet; Take 1 tablet by mouth 2 (two) times daily.  Dispense: 20 tablet; Refill: 0  2. Bronchospasm with bronchitis, acute  - predniSONE (DELTASONE) 20 MG tablet; Take 1 tablet (20 mg total) by mouth daily with breakfast. Take 3 tabs day 1, Take 2 and 1/2 tabs day 2,  Take 2 tab day 3, Take 1 and 1/2 tab day 4, 1 tab day 5  Dispense: 10 tablet; Refill: 0 - chlorpheniramine-HYDROcodone (TUSSIONEX PENNKINETIC ER) 10-8 MG/5ML SUER; Take 5 mLs by mouth at bedtime as needed for cough.  Dispense: 120 mL; Refill: 0 -Continue daily allergy and asthma medications and PRN neb and inhalers as prescribed. -Avoid  triggers.  3. Cough  - predniSONE (DELTASONE) 20 MG tablet; Take 1 tablet (20 mg total) by mouth daily with breakfast. Take 3 tabs day 1, Take 2 and 1/2 tabs day 2,  Take 2 tab day 3, Take 1 and 1/2 tab day 4, 1 tab day 5  Dispense: 10 tablet; Refill: 0 - chlorpheniramine-HYDROcodone (TUSSIONEX PENNKINETIC ER) 10-8 MG/5ML SUER; Take 5 mLs by mouth at bedtime as needed for cough.  Dispense: 120 mL; Refill: 0  -Note for work provided to be out until Friday  -Red flags and when to present for emergency care or RTC including fever >101.25F, chest pain, shortness of breath, new/worsening/un-resolving symptoms, reviewed with patient at time of visit. Follow up and care instructions discussed and provided in AVS.  I have reviewed this encounter including the documentation in this note and/or discussed this patient with the Deboraha Sprangprovider,Emily Boyce, FNP, NP-C. I am certifying that I agree with the content of this note as supervising physician.  Alba CoryKrichna Sowles, MD [facility] 04/05/2017, 2:37 PM

## 2017-04-14 ENCOUNTER — Other Ambulatory Visit: Payer: Self-pay | Admitting: Family Medicine

## 2017-04-14 DIAGNOSIS — R059 Cough, unspecified: Secondary | ICD-10-CM

## 2017-04-14 DIAGNOSIS — R05 Cough: Secondary | ICD-10-CM

## 2017-04-14 DIAGNOSIS — J209 Acute bronchitis, unspecified: Secondary | ICD-10-CM

## 2017-05-06 ENCOUNTER — Other Ambulatory Visit: Payer: Self-pay | Admitting: Family Medicine

## 2017-05-06 DIAGNOSIS — F411 Generalized anxiety disorder: Secondary | ICD-10-CM

## 2017-05-06 DIAGNOSIS — I10 Essential (primary) hypertension: Secondary | ICD-10-CM

## 2017-05-08 ENCOUNTER — Other Ambulatory Visit: Payer: Self-pay | Admitting: Family Medicine

## 2017-05-08 DIAGNOSIS — F411 Generalized anxiety disorder: Secondary | ICD-10-CM

## 2017-05-08 DIAGNOSIS — I10 Essential (primary) hypertension: Secondary | ICD-10-CM

## 2017-05-08 MED ORDER — LOSARTAN POTASSIUM-HCTZ 50-12.5 MG PO TABS: 1.0000 | ORAL_TABLET | Freq: Every day | ORAL | 0 refills | Status: DC

## 2017-05-08 MED ORDER — ALPRAZOLAM 0.5 MG PO TABS: 0.5000 mg | ORAL_TABLET | Freq: Two times a day (BID) | ORAL | 0 refills | Status: DC | PRN

## 2017-06-20 ENCOUNTER — Other Ambulatory Visit: Payer: Self-pay | Admitting: Family Medicine

## 2017-06-20 DIAGNOSIS — J3089 Other allergic rhinitis: Secondary | ICD-10-CM

## 2017-06-21 ENCOUNTER — Other Ambulatory Visit: Payer: Self-pay | Admitting: Family Medicine

## 2017-06-21 DIAGNOSIS — J3089 Other allergic rhinitis: Secondary | ICD-10-CM

## 2017-06-21 MED ORDER — LEVOCETIRIZINE DIHYDROCHLORIDE 5 MG PO TABS: 5.0000 mg | ORAL_TABLET | Freq: Every day | ORAL | 5 refills | Status: DC

## 2017-07-07 ENCOUNTER — Ambulatory Visit: Payer: Self-pay | Admitting: Family Medicine

## 2017-07-12 ENCOUNTER — Ambulatory Visit: Payer: Self-pay | Admitting: Family Medicine

## 2017-08-17 ENCOUNTER — Encounter: Payer: Self-pay | Admitting: Family Medicine

## 2017-08-17 ENCOUNTER — Ambulatory Visit (INDEPENDENT_AMBULATORY_CARE_PROVIDER_SITE_OTHER): Payer: 59 | Admitting: Family Medicine

## 2017-08-17 VITALS — BP 142/80 | HR 81 | Temp 98.8°F | Resp 16 | Ht 66.0 in | Wt 199.9 lb

## 2017-08-17 DIAGNOSIS — I10 Essential (primary) hypertension: Secondary | ICD-10-CM

## 2017-08-17 DIAGNOSIS — Z131 Encounter for screening for diabetes mellitus: Secondary | ICD-10-CM

## 2017-08-17 DIAGNOSIS — F411 Generalized anxiety disorder: Secondary | ICD-10-CM | POA: Diagnosis not present

## 2017-08-17 DIAGNOSIS — Z23 Encounter for immunization: Secondary | ICD-10-CM | POA: Diagnosis not present

## 2017-08-17 DIAGNOSIS — E538 Deficiency of other specified B group vitamins: Secondary | ICD-10-CM | POA: Diagnosis not present

## 2017-08-17 DIAGNOSIS — J4540 Moderate persistent asthma, uncomplicated: Secondary | ICD-10-CM

## 2017-08-17 DIAGNOSIS — G2581 Restless legs syndrome: Secondary | ICD-10-CM

## 2017-08-17 DIAGNOSIS — G629 Polyneuropathy, unspecified: Secondary | ICD-10-CM | POA: Diagnosis not present

## 2017-08-17 DIAGNOSIS — E559 Vitamin D deficiency, unspecified: Secondary | ICD-10-CM | POA: Diagnosis not present

## 2017-08-17 DIAGNOSIS — G43009 Migraine without aura, not intractable, without status migrainosus: Secondary | ICD-10-CM | POA: Diagnosis not present

## 2017-08-17 DIAGNOSIS — F321 Major depressive disorder, single episode, moderate: Secondary | ICD-10-CM | POA: Diagnosis not present

## 2017-08-17 DIAGNOSIS — E669 Obesity, unspecified: Secondary | ICD-10-CM

## 2017-08-17 DIAGNOSIS — Z13220 Encounter for screening for lipoid disorders: Secondary | ICD-10-CM | POA: Diagnosis not present

## 2017-08-17 DIAGNOSIS — J3089 Other allergic rhinitis: Secondary | ICD-10-CM | POA: Diagnosis not present

## 2017-08-17 MED ORDER — ALBUTEROL SULFATE HFA 108 (90 BASE) MCG/ACT IN AERS: 2.0000 | INHALATION_SPRAY | RESPIRATORY_TRACT | 0 refills | Status: DC | PRN

## 2017-08-17 MED ORDER — MONTELUKAST SODIUM 10 MG PO TABS: 10.0000 mg | ORAL_TABLET | Freq: Every day | ORAL | 1 refills | Status: DC

## 2017-08-17 MED ORDER — LOSARTAN POTASSIUM-HCTZ 50-12.5 MG PO TABS: 1.0000 | ORAL_TABLET | Freq: Every day | ORAL | 1 refills | Status: DC

## 2017-08-17 MED ORDER — ESCITALOPRAM OXALATE 5 MG PO TABS: 5.0000 mg | ORAL_TABLET | Freq: Every day | ORAL | 0 refills | Status: DC

## 2017-08-17 MED ORDER — LEVOCETIRIZINE DIHYDROCHLORIDE 5 MG PO TABS: 5.0000 mg | ORAL_TABLET | Freq: Every day | ORAL | 1 refills | Status: DC

## 2017-08-17 MED ORDER — RIZATRIPTAN BENZOATE 10 MG PO TBDP: 10.0000 mg | ORAL_TABLET | ORAL | 0 refills | Status: DC | PRN

## 2017-08-17 MED ORDER — BUDESONIDE-FORMOTEROL FUMARATE 160-4.5 MCG/ACT IN AERO: 2.0000 | INHALATION_SPRAY | Freq: Two times a day (BID) | RESPIRATORY_TRACT | 2 refills | Status: DC

## 2017-08-17 MED ORDER — ALPRAZOLAM 0.5 MG PO TABS: 0.5000 mg | ORAL_TABLET | Freq: Two times a day (BID) | ORAL | 0 refills | Status: DC | PRN

## 2017-08-17 NOTE — Progress Notes (Signed)
Name: Destiny Atkinson   MRN: [id number]    DOB: 1976-04-27   Date:08/17/2017       Progress Note  Subjective  Chief Complaint  Chief Complaint  Patient presents with  . Medication Refill    6 month F/U  . Migraine    Medication is helping having them off and on  . Asthma    Stable  . Anxiety    Has been amazing-has been able to cut her pills in half and take them every other day. Due to her anxiety being under control  . Hypertension    Headaches  . Allergic Rhinitis     HPI  Migraine : she was doing well, episodes down to once every other week and able to function, she has not missed work recently because of migraine.. She states she has phonophobia, and also photophobia. No nausea or vomiting. Migraine is described as a sharp pain over left eye and radiates to left temporal area and sometimes nuchal area.  She takes Imitrex prn and states it works within 30 minutes.   HTN: she ran out of her medication about 4 days ago and bp is elevated today No chest pain - unless if she has a panic attack, occasionally has palpitation, also related to anxiety, but resolves with deep slow breaths. Dizziness has resolved.   Anxiety/Depression: she left [employer] and started a new job at [employer] in [place] back in May 2017, she left [employer] march 2018 when she walked away from her job,  and was unemployed until 06/2017. She is now back on textiles and is doing well weaning self off Lexapro, taking 5 mg every other day, panic attacks seldom and still has some alprazolam left from 04/2017  RLS/Peripheral Neuropathy: She has a need to move her legs when sitting down, also states that at night her legs moves constantly. She also states than when she stands up for a long time her legs feels sore/a discomfort sensation that goes from her buttocks down to her legs. She states prickling sensation from her feet to her toes has resolved. Requip stopped working after 3 months she was given Horizant  but did not work even at the 600 mg dose. she was given a referral to neurologist and EMG/NCS but she had to cancel secondary to new job (started May 2017), she wants to hold off on referral, she will resume B12 supplementation.   AR: she his allergic to cats, but has 3 cats at home, also allergic to dogs and has one of them, uses Rhinochort otc, xyzal and singulair. Still wakes up with puffy eyes every morning, she has rhinorrhea, nasal congestion, improves when she is not home. She does not want to get rid of her animals, and does not have time to have allergy shots. She will resume medication  Asthma: she still smokes, about 8 cigarettes day now, has a chronic cough , and daily wheezing worse in am's ( may be related to allergy to her animals), she used to see  Dr. Welton Flakes, she was on Breo, but it caused oral candidiasis. She has used Symbicort in the past and liked it, but out because of gap in insurance, we will give her vouchers and a new prescription today     Patient Active Problem List   Diagnosis Date Noted  . Vitamin D deficiency 12/28/2016  . Mild major depression (HCC) 11/17/2015  . GAD (generalized anxiety disorder) 05/06/2015  . Asthma, moderate persistent 05/06/2015  .  Mobitz type I incomplete atrioventricular block 05/06/2015  . B12 deficiency 05/06/2015  . Essential (primary) hypertension 05/06/2015  . Irregular bleeding 05/06/2015  . Migraine without aura and responsive to treatment 05/06/2015  . Lack of erotic interest 05/06/2015  . BMI 31.0-31.9,adult 05/06/2015  . Peripheral neuropathy 05/06/2015  . Perennial allergic rhinitis 05/06/2015  . Restless leg 05/06/2015  . Tobacco abuse 05/06/2015    Past Surgical History:  Procedure Laterality Date  . NASAL SINUS SURGERY      Family History  Problem Relation Age of Onset  . COPD Mother   . Fibromyalgia Mother   . Hypertension Mother   . Stroke Mother   . Asthma Mother   . Arthritis Mother   . Depression  Mother   . Heart disease Mother   . Diabetes Father   . Epilepsy Brother     Social History   Social History  . Marital status: Married    Spouse name: N/A  . Number of children: 2  . Years of education: N/A   Occupational History  . Public librarian   Social History Main Topics  . Smoking status: Current Every Day Smoker    Packs/day: 0.50    Years: 20.00    Types: Cigarettes    Start date: 06/29/1995  . Smokeless tobacco: Never Used  . Alcohol use 0.0 oz/week     Comment: rarely  . Drug use: No  . Sexual activity: Yes    Partners: Male    Birth control/ protection: Pill   Other Topics Concern  . 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  albuterol (PROAIR HFA) 108 (90 Base) MCG/ACT inhaler, Inhale 2 puffs into the lungs every 4 (four) hours as needed., Disp: 1 Inhaler, Rfl: 0 .  ALPRAZolam (XANAX) 0.5 MG tablet, Take 1 tablet (0.5 mg total) by mouth 2 (two) times daily as needed., Disp: 30 tablet, Rfl: 0 .  EPINEPHrine (EPIPEN 2-PAK) 0.3 mg/0.3 mL IJ SOAJ injection, Inject 0.3 mLs (0.3 mg total) into the muscle once., Disp: 2 Device, Rfl: 1 .  escitalopram (LEXAPRO) 5 MG tablet, Take 1 tablet (5 mg total) by mouth daily., Disp: 90 tablet, Rfl: 0 .  ipratropium-albuterol (DUONEB) 0.5-2.5 (3) MG/3ML SOLN, Take 3 mLs by nebulization every 6 (six) hours as needed., Disp: , Rfl:  .  levocetirizine (XYZAL) 5 MG tablet, Take 1 tablet (5 mg total) by mouth daily., Disp: 90 tablet, Rfl: 1 .  losartan-hydrochlorothiazide (HYZAAR) 50-12.5 MG tablet, Take 1 tablet by mouth daily., Disp: 90 tablet, Rfl: 1 .  montelukast (SINGULAIR) 10 MG tablet, Take 1 tablet (10 mg total) by mouth daily., Disp: 90 tablet, Rfl: 1 .  norethindrone (MICRONOR,CAMILA,ERRIN) 0.35 MG tablet, Take 1 tablet by mouth daily., Disp: ,  Rfl:  .  rizatriptan (MAXALT-MLT) 10 MG disintegrating tablet, Take 1 tablet (10 mg total) by mouth as needed for migraine. May repeat in 2 hours if needed, Disp: 10 tablet, Rfl: 0 .  budesonide-formoterol (SYMBICORT) 160-4.5 MCG/ACT inhaler, Inhale 2 puffs into the lungs 2 (two) times daily., Disp: 1 Inhaler, Rfl: 2  Allergies  Allergen Reactions  . Dog Epithelium     Anaphylaxis with Rabbits.  Dogs, cats, birds cause swelling, extreme itching.  . Dust Mite Extract   . Pollen Extract   .  Soap   . Tape   . Tree Extract      ROS  Constitutional: Negative for fever or weight change.  Respiratory: Positive for intermittent cough and wheezing in am's, but no  shortness of breath.   Cardiovascular: Negative for chest pain or palpitations.  Gastrointestinal: Negative for abdominal pain, no bowel changes.  Musculoskeletal: Negative for gait problem or joint swelling.  Skin: Negative for rash.  Neurological: Negative for dizziness, positive for intermittent  headache.  No other specific complaints in a complete review of systems (except as listed in HPI above).  Objective  Vitals:   08/17/17 0858  BP: (!) 142/80  Pulse: 81  Resp: 16  Temp: 98.8 F (37.1 C)  TempSrc: Oral  SpO2: 97%  Weight: 199 lb 14.4 oz (90.7 kg)  Height:  (1.676 m)    Body mass index is 32.26 kg/m.  Physical Exam  Constitutional: Patient appears well-developed and well-nourished. Obese  No distress.  HEENT: head atraumatic, normocephalic, pupils equal and reactive to light, ears normal TM bilaterally,  neck supple, throat within normal limits Cardiovascular: Normal rate, regular rhythm and normal heart sounds.  No murmur heard. No BLE edema. Pulmonary/Chest: Effort normal and breath sounds normal. No respiratory distress. Abdominal: Soft.  There is no tenderness. Psychiatric: Patient has a normal mood and affect. behavior is normal. Judgment and thought content normal.  PHQ2/9: Depression screen  [facility] 2/9 08/17/2017 12/28/2016 05/03/2016 11/17/2015 06/29/2015  Decreased Interest 0 1 0 0 0  Down, Depressed, Hopeless 0 1 0 1 1  PHQ - 2 Score 0 2 0 1 1  Altered sleeping - 3 - - -  Tired, decreased energy - 2 - - -  Change in appetite - 0 - - -  Feeling bad or failure about yourself  - 1 - - -  Trouble concentrating - 1 - - -  Moving slowly or fidgety/restless - 3 - - -  Suicidal thoughts - 0 - - -  PHQ-9 Score - 12 - - -  Difficult doing work/chores - Somewhat difficult - - -     Fall Risk: Fall Risk  08/17/2017 12/28/2016 05/03/2016 11/17/2015 06/29/2015  Falls in the past year? No No No No No     Functional Status Survey: Is the patient deaf or have difficulty hearing?: No Does the patient have difficulty seeing, even when wearing glasses/contacts?: No Does the patient have difficulty concentrating, remembering, or making decisions?: No Does the patient have difficulty walking or climbing stairs?: No Does the patient have difficulty dressing or bathing?: No Does the patient have difficulty doing errands alone such as visiting a doctor's office or shopping?: No  Assessment & Plan  1. GAD (generalized anxiety disorder)  - escitalopram (LEXAPRO) 5 MG tablet; Take 1 tablet (5 mg total) by mouth daily.  Dispense: 90 tablet; Refill: 0 - ALPRAZolam (XANAX) 0.5 MG tablet; Take 1 tablet (0.5 mg total) by mouth 2 (two) times daily as needed.  Dispense: 30 tablet; Refill: 0  2. Essential hypertension  - losartan-hydrochlorothiazide (HYZAAR) 50-12.5 MG tablet; Take 1 tablet by mouth daily.  Dispense: 90 tablet; Refill: 1 - COMPLETE METABOLIC PANEL WITH GFR - CBC with Differential/Platelet  3. B12 deficiency  She will resume otc supplementation   4. Peripheral polyneuropathy  Needs to resume B12  5. Moderate major depression (HCC)  Advised to keep 5 mg through winter and try to wean in the Spring - escitalopram (LEXAPRO) 5 MG tablet; Take 1 tablet (5  mg total) by mouth daily.   Dispense: 90 tablet; Refill: 0  6. Restless leg  Stable, she did not get NCS, Requip worked for 3 months and after that no response, failed gabapentin.   7. Perennial allergic rhinitis  - montelukast (SINGULAIR) 10 MG tablet; Take 1 tablet (10 mg total) by mouth daily.  Dispense: 90 tablet; Refill: 1 - levocetirizine (XYZAL) 5 MG tablet; Take 1 tablet (5 mg total) by mouth daily.  Dispense: 90 tablet; Refill: 1  8. Migraine without aura and responsive to treatment  - rizatriptan (MAXALT-MLT) 10 MG disintegrating tablet; Take 1 tablet (10 mg total) by mouth as needed for migraine. May repeat in 2 hours if needed  Dispense: 10 tablet; Refill: 0  9. Moderate persistent asthma without complication  She has been out of medication because had a gap in insurance - budesonide-formoterol (SYMBICORT) 160-4.5 MCG/ACT inhaler; Inhale 2 puffs into the lungs 2 (two) times daily.  Dispense: 1 Inhaler; Refill: 2 - albuterol (PROAIR HFA) 108 (90 Base) MCG/ACT inhaler; Inhale 2 puffs into the lungs every 4 (four) hours as needed.  Dispense: 1 Inhaler; Refill: 0  10. Vitamin D deficiency  Resume supplementation   11. Lipid screening  - Lipid panel  12. Needs flu shot  Refused, will get it at work   13. Obesity (BMI 30.0-34.9)  Discussed with the patient the risk posed by an increased BMI. Discussed importance of portion control, calorie counting and at least 150 minutes of physical activity weekly. Avoid sweet beverages and drink more water. Eat at least 6 servings of fruit and vegetables daily  - Hemoglobin A1c - Insulin, fasting  14. Diabetes mellitus screening  - Hemoglobin A1c - Insulin, fasting

## 2017-09-26 ENCOUNTER — Other Ambulatory Visit: Payer: Self-pay | Admitting: Family Medicine

## 2017-09-26 DIAGNOSIS — G43009 Migraine without aura, not intractable, without status migrainosus: Secondary | ICD-10-CM

## 2017-09-27 NOTE — Telephone Encounter (Signed)
Refill request for general medication: Maxalt   Last office visit:  08/17/2017  Last physical exam: None indicated  Follow up: None indicated

## 2017-10-26 ENCOUNTER — Other Ambulatory Visit: Payer: Self-pay | Admitting: Family Medicine

## 2017-10-26 DIAGNOSIS — F411 Generalized anxiety disorder: Secondary | ICD-10-CM

## 2017-11-10 ENCOUNTER — Encounter: Payer: Self-pay | Admitting: Family Medicine

## 2017-11-10 ENCOUNTER — Ambulatory Visit (INDEPENDENT_AMBULATORY_CARE_PROVIDER_SITE_OTHER): Payer: 59 | Admitting: Family Medicine

## 2017-11-10 VITALS — BP 116/62 | HR 90 | Temp 99.1°F | Resp 18 | Ht 66.0 in | Wt 195.4 lb

## 2017-11-10 DIAGNOSIS — J4541 Moderate persistent asthma with (acute) exacerbation: Secondary | ICD-10-CM | POA: Diagnosis not present

## 2017-11-10 DIAGNOSIS — Z720 Tobacco use: Secondary | ICD-10-CM | POA: Diagnosis not present

## 2017-11-10 DIAGNOSIS — B3731 Acute candidiasis of vulva and vagina: Secondary | ICD-10-CM

## 2017-11-10 DIAGNOSIS — B373 Candidiasis of vulva and vagina: Secondary | ICD-10-CM

## 2017-11-10 MED ORDER — AMOXICILLIN-POT CLAVULANATE 875-125 MG PO TABS: 1.0000 | ORAL_TABLET | Freq: Two times a day (BID) | ORAL | 0 refills | Status: AC

## 2017-11-10 MED ORDER — IPRATROPIUM-ALBUTEROL 0.5-2.5 (3) MG/3ML IN SOLN: 3.0000 mL | Freq: Four times a day (QID) | RESPIRATORY_TRACT | 1 refills | Status: DC | PRN

## 2017-11-10 MED ORDER — PREDNISONE 20 MG PO TABS: 20.0000 mg | ORAL_TABLET | Freq: Two times a day (BID) | ORAL | 0 refills | Status: AC

## 2017-11-10 MED ORDER — FLUCONAZOLE 150 MG PO TABS: 150.0000 mg | ORAL_TABLET | Freq: Once | ORAL | 1 refills | Status: AC

## 2017-11-10 MED ORDER — ALBUTEROL SULFATE HFA 108 (90 BASE) MCG/ACT IN AERS: 2.0000 | INHALATION_SPRAY | RESPIRATORY_TRACT | 0 refills | Status: DC | PRN

## 2017-11-10 NOTE — Addendum Note (Signed)
Addended by: Cynda FamiliaJOHNSON, Phyllip Claw L on: 11/10/2017 04:23 PM   Modules accepted: Orders

## 2017-11-10 NOTE — Progress Notes (Signed)
Name: Destiny Atkinson   MRN: [id number]    DOB: Feb 17, 1976   Date:11/10/2017       Progress Note  Subjective  Chief Complaint  Chief Complaint  Patient presents with  . Cough    Onset-1 week, thick productive cough-green/brown phlegm, tickle in throat, headache, sinus pressure, runny nose, SOB and wheezing. Has been taking otc DayQuil and NyQuil that helped relieve some of her symptoms    HPI  Pt presents for new onset cough - thick sputum/productive sputum x7 days.  Pt has persistent asthma and has occasional exacerbations.  She has taken Azithromycin in the past but it has not worked as well as Augmentin.  Pt still smoking - about 12 cigarettes a day; smoker x28 years.  She endorses wheezing and some shortness of breath with coughing - worse at night and first thing in the morning; occasional subjective fevers/chills, fatigue, and mild central chest tightness.  Denies sinus congestion or ear pain/pressure, no body aches.   Out of Duoneb - will refill today.  Out of Symbicort for 3 months due to expense - we will provide coupon today. Has albuterol inhaler - using 2-3 times a day. Taking Xyzal daily, singulair daily.  Has been taking OTC cough & cold medication w/ acetaminophen.  Patient Active Problem List   Diagnosis Date Noted  . Vitamin D deficiency 12/28/2016  . Mild major depression (HCC) 11/17/2015  . GAD (generalized anxiety disorder) 05/06/2015  . Asthma, moderate persistent 05/06/2015  . Mobitz type I incomplete atrioventricular block 05/06/2015  . B12 deficiency 05/06/2015  . Essential (primary) hypertension 05/06/2015  . Irregular bleeding 05/06/2015  . Migraine without aura and responsive to treatment 05/06/2015  . Lack of erotic interest 05/06/2015  . BMI 31.0-31.9,adult 05/06/2015  . Peripheral neuropathy 05/06/2015  . Perennial allergic rhinitis 05/06/2015  . Restless leg 05/06/2015  . Tobacco abuse 05/06/2015    Social History   Tobacco Use  . Smoking status:  Current Every Day Smoker    Packs/day: 0.50    Years: 20.00    Pack years: 10.00    Types: Cigarettes    Start date: 06/29/1995  . Smokeless tobacco: Never Used  Substance Use Topics  . Alcohol use: Yes    Alcohol/week: 0.0 oz    Comment: rarely     Current Outpatient Medications:  .  albuterol (PROAIR HFA) 108 (90 Base) MCG/ACT inhaler, Inhale 2 puffs into the lungs every 4 (four) hours as needed., Disp: 1 Inhaler, Rfl: 0 .  ALPRAZolam (XANAX) 0.5 MG tablet, Take 1 tablet (0.5 mg total) by mouth 2 (two) times daily as needed., Disp: 30 tablet, Rfl: 0 .  EPINEPHrine (EPIPEN 2-PAK) 0.3 mg/0.3 mL IJ SOAJ injection, Inject 0.3 mLs (0.3 mg total) into the muscle once., Disp: 2 Device, Rfl: 1 .  escitalopram (LEXAPRO) 5 MG tablet, Take 1 tablet (5 mg total) by mouth daily., Disp: 90 tablet, Rfl: 0 .  ipratropium-albuterol (DUONEB) 0.5-2.5 (3) MG/3ML SOLN, Take 3 mLs by nebulization every 6 (six) hours as needed., Disp: 360 mL, Rfl: 1 .  levocetirizine (XYZAL) 5 MG tablet, Take 1 tablet (5 mg total) by mouth daily., Disp: 90 tablet, Rfl: 1 .  losartan-hydrochlorothiazide (HYZAAR) 50-12.5 MG tablet, Take 1 tablet by mouth daily., Disp: 90 tablet, Rfl: 1 .  montelukast (SINGULAIR) 10 MG tablet, Take 1 tablet (10 mg total) by mouth daily., Disp: 90 tablet, Rfl: 1 .  norethindrone (MICRONOR,CAMILA,ERRIN) 0.35 MG tablet, Take 1 tablet by mouth daily., Disp: ,  Rfl:  .  rizatriptan (MAXALT-MLT) 10 MG disintegrating tablet, Take 1 tablet (10 mg total) as needed by mouth for migraine., Disp: 9 tablet, Rfl: 0 .  amoxicillin-clavulanate (AUGMENTIN) 875-125 MG tablet, Take 1 tablet by mouth 2 (two) times daily for 10 days., Disp: 20 tablet, Rfl: 0 .  budesonide-formoterol (SYMBICORT) 160-4.5 MCG/ACT inhaler, Inhale 2 puffs into the lungs 2 (two) times daily. (Patient not taking: Reported on 11/10/2017), Disp: 1 Inhaler, Rfl: 2 .  fluconazole (DIFLUCAN) 150 MG tablet, Take 1 tablet (150 mg total) by mouth once  for 1 dose. May repeat in 72 hours if not improving., Disp: 1 tablet, Rfl: 1 .  predniSONE (DELTASONE) 20 MG tablet, Take 1 tablet (20 mg total) by mouth 2 (two) times daily with a meal for 5 days. Last dose before 2pm, Disp: 10 tablet, Rfl: 0  Allergies  Allergen Reactions  . Dog Epithelium     Anaphylaxis with Rabbits.  Dogs, cats, birds cause swelling, extreme itching.  . Dust Mite Extract   . Pollen Extract   . Soap   . Tape   . Tree Extract     ROS  Constitutional: Negative for fever or weight change.  Respiratory: Positive for cough and shortness of breath.   Cardiovascular: Negative for chest pain or palpitations.  Gastrointestinal: Negative for abdominal pain, no bowel changes.  Musculoskeletal: Negative for gait problem or joint swelling.  Skin: Negative for rash.  Neurological: Negative for dizziness; endorses headache.  No other specific complaints in a complete review of systems (except as listed in HPI above).  Objective  Vitals:   11/10/17 1534  BP: 116/62  Pulse: 90  Resp: 18  Temp: 99.1 F (37.3 C)  TempSrc: Oral  SpO2: 95%  Weight: 195 lb 6.4 oz (88.6 kg)  Height: 5\' 6"  (1.676 m)   Body mass index is 31.54 kg/m.  Nursing Note and Vital Signs reviewed.  Physical Exam  Constitutional: Patient appears well-developed and well-nourished. No distress.  HEENT: head atraumatic, normocephalic, pupils equal and reactive to light, EOM's intact, TM's without erythema or bulging, neck supple with bilateral submandibular lymphadenopathy, oropharynx erythematous and moist without exudate Cardiovascular: Normal rate, regular rhythm, S1/S2 present.  No murmur or rub heard. No BLE edema. Pulmonary/Chest: Effort normal and breath sounds diminished bilaterally, no wheezes or rhonchi. No respiratory distress or retractions.  Congested productive cough present throughout examination Psychiatric: Patient has a normal mood and affect. behavior is normal. Judgment and  thought content normal.  No results found for this or any previous visit (from the past 2160 hour(s)).   Assessment & Plan  1. Moderate persistent asthma with acute exacerbation - ipratropium-albuterol (DUONEB) 0.5-2.5 (3) MG/3ML SOLN; Take 3 mLs by nebulization every 6 (six) hours as needed.  Dispense: 360 mL; Refill: 1 - predniSONE (DELTASONE) 20 MG tablet; Take 1 tablet (20 mg total) by mouth 2 (two) times daily with a meal for 5 days. Last dose before 2pm  Dispense: 10 tablet; Refill: 0 - albuterol (PROAIR HFA) 108 (90 Base) MCG/ACT inhaler; Inhale 2 puffs into the lungs every 4 (four) hours as needed.  Dispense: 1 Inhaler; Refill: 0 - amoxicillin-clavulanate (AUGMENTIN) 875-125 MG tablet; Take 1 tablet by mouth 2 (two) times daily for 10 days.  Dispense: 20 tablet; Refill: 0  2. Vaginal candidiasis - fluconazole (DIFLUCAN) 150 MG tablet; Take 1 tablet (150 mg total) by mouth once for 1 dose. May repeat in 72 hours if not improving.  Dispense: 1 tablet; Refill:  1  3. Tobacco abuse Discussed cessation, not ready at this time.  -Red flags and when to present for emergency care or RTC including fever >101.60F, chest pain, shortness of breath unrelieved with medications, new/worsening/un-resolving symptoms, reviewed with patient at time of visit. Follow up and care instructions discussed and provided in AVS.

## 2017-11-10 NOTE — Patient Instructions (Addendum)
May take Probiotic or eat probiotic yogurt while taking medication and for 7 days afterwards.   Asthma Attack Prevention, Adult Although you may not be able to control the fact that you have asthma, you can take actions to prevent episodes of asthma (asthma attacks). These actions include:  Creating a written plan for managing and treating your asthma attacks (asthma action plan).  Monitoring your asthma.  Avoiding things that can irritate your airways or make your asthma symptoms worse (asthma triggers).  Taking your medicines as directed.  Acting quickly if you have signs or symptoms of an asthma attack.  What are some ways to prevent an asthma attack? Create a plan Work with your health care provider to create an asthma action plan. This plan should include:  A list of your asthma triggers and how to avoid them.  A list of symptoms that you experience during an asthma attack.  Information about when to take medicine and how much medicine to take.  Information to help you understand your peak flow measurements.  Contact information for your health care providers.  Daily actions that you can take to control asthma.  Monitor your asthma  To monitor your asthma:  Use your peak flow meter every morning and every evening for 2-3 weeks. Record the results in a journal. A drop in your peak flow numbers on one or more days may mean that you are starting to have an asthma attack, even if you are not having symptoms.  When you have asthma symptoms, write them down in a journal.  Avoid asthma triggers  Work with your health care provider to find out what your asthma triggers are. This can be done by:  Being tested for allergies.  Keeping a journal that notes when asthma attacks occur and what may have contributed to them.  Asking your health care provider whether other medical conditions make your asthma worse.  Common asthma triggers include:  Dust.  Smoke. This includes  campfire smoke and secondhand smoke from tobacco products.  Pet dander.  Trees, grasses or pollens.  Very cold, dry, or humid air.  Mold.  Foods that contain high amounts of sulfites.  Strong smells.  Engine exhaust and air pollution.  Aerosol sprays and fumes from household cleaners.  Household pests and their droppings, including dust mites and cockroaches.  Certain medicines, including NSAIDs.  Once you have determined your asthma triggers, take steps to avoid them. Depending on your triggers, you may be able to reduce the chance of an asthma attack by:  Keeping your home clean. Have someone dust and vacuum your home for you 1 or 2 times a week. If possible, have them use a high-efficiency particulate arrestance (HEPA) vacuum.  Washing your sheets weekly in hot water.  Using allergy-proof mattress covers and casings on your bed.  Keeping pets out of your home.  Taking care of mold and water problems in your home.  Avoiding areas where people smoke.  Avoiding using strong perfumes or odor sprays.  Avoid spending a lot of time outdoors when pollen counts are high and on very windy days.  Talking with your health care provider before stopping or starting any new medicines.  Medicines Take over-the-counter and prescription medicines only as told by your health care provider. Many asthma attacks can be prevented by carefully following your medicine schedule. Taking your medicines correctly is especially important when you cannot avoid certain asthma triggers. Even if you are doing well, do not stop taking  your medicine and do not take less medicine. Act quickly If an asthma attack happens, acting quickly can decrease how severe it is and how long it lasts. Take these actions:  Pay attention to your symptoms. If you are coughing, wheezing, or having difficulty breathing, do not wait to see if your symptoms go away on their own. Follow your asthma action plan.  If you  have followed your asthma action plan and your symptoms are not improving, call your health care provider or seek immediate medical care at the nearest hospital.  It is important to write down how often you need to use your fast-acting rescue inhaler. You can track how often you use an inhaler in your journal. If you are using your rescue inhaler more often, it may mean that your asthma is not under control. Adjusting your asthma treatment plan may help you to prevent future asthma attacks and help you to gain better control of your condition. How can I prevent an asthma attack when I exercise?  Exercise is a common asthma trigger. To prevent asthma attacks during exercise:  Follow advice from your health care provider about whether you should use your fast-acting inhaler before exercising. Many people with asthma experience exercise-induced bronchoconstriction (EIB). This condition often worsens during vigorous exercise in cold, humid, or dry environments. Usually, people with EIB can stay very active by using a fast-acting inhaler before exercising.  Avoid exercising outdoors in very cold or humid weather.  Avoid exercising outdoors when pollen counts are high.  Warm up and cool down when exercising.  Stop exercising right away if asthma symptoms start.  Consider taking part in exercises that are less likely to cause asthma symptoms such as:  Indoor swimming.  Biking.  Walking.  Hiking.  Playing football.  This information is not intended to replace advice given to you by your health care provider. Make sure you discuss any questions you have with your health care provider. Document Released: 10/26/2009 Document Revised: 07/08/2016 Document Reviewed: 04/23/2016 Elsevier Interactive Patient Education  2018 [employer].  Asthma, Acute Bronchospasm Acute bronchospasm caused by asthma is also referred to as an asthma attack. Bronchospasm means your air passages become narrowed. The  narrowing is caused by inflammation and tightening of the muscles in the air tubes (bronchi) in your lungs. This can make it hard to breathe or cause you to wheeze and cough. What are the causes? Possible triggers are:  Animal dander from the skin, hair, or feathers of animals.  Dust mites contained in house dust.  Cockroaches.  Pollen from trees or grass.  Mold.  Cigarette or tobacco smoke.  Air pollutants such as dust, household cleaners, hair sprays, aerosol sprays, paint fumes, strong chemicals, or strong odors.  Cold air or weather changes. Cold air may trigger inflammation. Winds increase molds and pollens in the air.  Strong emotions such as crying or laughing hard.  Stress.  Certain medicines such as aspirin or beta-blockers.  Sulfites in foods and drinks, such as dried fruits and wine.  Infections or inflammatory conditions, such as a flu, cold, or inflammation of the nasal membranes (rhinitis).  Gastroesophageal reflux disease (GERD). GERD is a condition where stomach acid backs up into your esophagus.  Exercise or strenuous activity.  What are the signs or symptoms?  Wheezing.  Excessive coughing, particularly at night.  Chest tightness.  Shortness of breath. How is this diagnosed? Your health care provider will ask you about your medical history and perform a physical  exam. A chest X-ray or blood testing may be performed to look for other causes of your symptoms or other conditions that may have triggered your asthma attack. How is this treated? Treatment is aimed at reducing inflammation and opening up the airways in your lungs. Most asthma attacks are treated with inhaled medicines. These include quick relief or rescue medicines (such as bronchodilators) and controller medicines (such as inhaled corticosteroids). These medicines are sometimes given through an inhaler or a nebulizer. Systemic steroid medicine taken by mouth or given through an IV tube also  can be used to reduce the inflammation when an attack is moderate or severe. Antibiotic medicines are only used if a bacterial infection is present. Follow these instructions at home:  Rest.  Drink plenty of liquids. This helps the mucus to remain thin and be easily coughed up. Only use caffeine in moderation and do not use alcohol until you have recovered from your illness.  Do not smoke. Avoid being exposed to secondhand smoke.  You play a critical role in keeping yourself in good health. Avoid exposure to things that cause you to wheeze or to have breathing problems.  Keep your medicines up-to-date and available. Carefully follow your health care provider's treatment plan.  Take your medicine exactly as prescribed.  When pollen or pollution is bad, keep windows closed and use an air conditioner or go to places with air conditioning.  Asthma requires careful medical care. See your health care provider for a follow-up as advised. If you are more than [redacted] weeks pregnant and you were prescribed any new medicines, let your obstetrician know about the visit and how you are doing. Follow up with your health care provider as directed.  After you have recovered from your asthma attack, make an appointment with your outpatient doctor to talk about ways to reduce the likelihood of future attacks. If you do not have a doctor who manages your asthma, make an appointment with a primary care doctor to discuss your asthma. Get help right away if:  You are getting worse.  You have trouble breathing. If severe, call your local emergency services (911 in the [place]  You develop chest pain or discomfort.  You are vomiting.  You are not able to keep fluids down.  You are coughing up yellow, green, brown, or bloody sputum.  You have a fever and your symptoms suddenly get worse.  You have trouble swallowing. This information is not intended to replace advice given to you by your health care provider.  Make sure you discuss any questions you have with your health care provider. Document Released: 02/22/2007 Document Revised: 04/20/2016 Document Reviewed: 05/15/2013 Elsevier Interactive Patient Education  2017 [employer]

## 2017-11-13 LAB — COMPLETE METABOLIC PANEL WITH GFR
AG Ratio: 1.8 (calc) (ref 1.0–2.5)
ALBUMIN MSPROF: 4.6 g/dL (ref 3.6–5.1)
ALT: 11 U/L (ref 6–29)
AST: 16 U/L (ref 10–30)
Alkaline phosphatase (APISO): 79 U/L (ref 33–115)
BILIRUBIN TOTAL: 0.6 mg/dL (ref 0.2–1.2)
BUN: 7 mg/dL (ref 7–25)
CO2: 26 mmol/L (ref 20–32)
CREATININE: 0.78 mg/dL (ref 0.50–1.10)
Calcium: 9.6 mg/dL (ref 8.6–10.2)
Chloride: 102 mmol/L (ref 98–110)
GFR, EST AFRICAN AMERICAN: 110 mL/min/{1.73_m2} (ref >=60)
GFR, Est Non African American: 95 mL/min/{1.73_m2} (ref >=60)
GLOBULIN: 2.5 g/dL (ref 1.9–3.7)
GLUCOSE: 88 mg/dL (ref 65–99)
Potassium: 4 mmol/L (ref 3.5–5.3)
Sodium: 137 mmol/L (ref 135–146)
TOTAL PROTEIN: 7.1 g/dL (ref 6.1–8.1)

## 2017-11-13 LAB — CBC WITH DIFFERENTIAL/PLATELET
BASOS ABS: 56 {cells}/uL (ref 0–200)
Basophils Relative: 0.7 %
Eosinophils Absolute: 232 cells/uL (ref 15–500)
Eosinophils Relative: 2.9 %
HEMATOCRIT: 42.2 % (ref 35.0–45.0)
Hemoglobin: 14.6 g/dL (ref 11.7–15.5)
LYMPHS ABS: 3248 {cells}/uL (ref 850–3900)
MCH: 32.2 pg (ref 27.0–33.0)
MCHC: 34.6 g/dL (ref 32.0–36.0)
MCV: 93 fL (ref 80.0–100.0)
MPV: 9.5 fL (ref 7.5–12.5)
Monocytes Relative: 7.8 %
NEUTROS PCT: 48 %
Neutro Abs: 3840 cells/uL (ref 1500–7800)
Platelets: 304 10*3/uL (ref 140–400)
RBC: 4.54 10*6/uL (ref 3.80–5.10)
RDW: 12.4 % (ref 11.0–15.0)
Total Lymphocyte: 40.6 %
WBC: 8 10*3/uL (ref 3.8–10.8)
WBCMIX: 624 {cells}/uL (ref 200–950)

## 2017-11-13 LAB — LIPID PANEL
Cholesterol: 214 mg/dL — ABNORMAL HIGH (ref <200)
HDL: 39 mg/dL — ABNORMAL LOW (ref >50)
LDL Cholesterol (Calc): 131 mg/dL (calc) — ABNORMAL HIGH
NON-HDL CHOLESTEROL (CALC): 175 mg/dL — AB (ref <130)
Total CHOL/HDL Ratio: 5.5 (calc) — ABNORMAL HIGH (ref <5.0)
Triglycerides: 285 mg/dL — ABNORMAL HIGH (ref <150)

## 2017-11-13 LAB — HEMOGLOBIN A1C
HEMOGLOBIN A1C: 6.1 %{Hb} — AB (ref <5.7)
MEAN PLASMA GLUCOSE: 128 (calc)
eAG (mmol/L): 7.1 (calc)

## 2017-11-13 LAB — INSULIN, RANDOM: Insulin: 16.4 u[IU]/mL (ref 2.0–19.6)

## 2017-11-17 ENCOUNTER — Encounter: Payer: Self-pay | Admitting: Family Medicine

## 2017-11-17 DIAGNOSIS — R7303 Prediabetes: Secondary | ICD-10-CM | POA: Insufficient documentation

## 2017-11-23 ENCOUNTER — Other Ambulatory Visit: Payer: Self-pay | Admitting: Certified Nurse Midwife

## 2017-11-28 ENCOUNTER — Ambulatory Visit: Payer: Self-pay | Admitting: Family Medicine

## 2017-12-22 ENCOUNTER — Other Ambulatory Visit: Payer: Self-pay | Admitting: Certified Nurse Midwife

## 2018-01-15 ENCOUNTER — Other Ambulatory Visit: Payer: Self-pay

## 2018-01-15 MED ORDER — NORETHINDRONE 0.35 MG PO TABS: 1.0000 | ORAL_TABLET | Freq: Every day | ORAL | 0 refills | Status: DC

## 2018-01-15 NOTE — Telephone Encounter (Signed)
Pt has AEX sched.  Needs refill on Heather sent to [facility]. [phone]  Pt aware eRx'd.

## 2018-01-29 ENCOUNTER — Encounter: Payer: Self-pay | Admitting: Certified Nurse Midwife

## 2018-01-29 ENCOUNTER — Ambulatory Visit (INDEPENDENT_AMBULATORY_CARE_PROVIDER_SITE_OTHER): Payer: 59 | Admitting: Certified Nurse Midwife

## 2018-01-29 VITALS — BP 100/60 | HR 88 | Ht 66.5 in | Wt 197.0 lb

## 2018-01-29 DIAGNOSIS — Z308 Encounter for other contraceptive management: Secondary | ICD-10-CM

## 2018-01-29 DIAGNOSIS — Z01419 Encounter for gynecological examination (general) (routine) without abnormal findings: Secondary | ICD-10-CM | POA: Diagnosis not present

## 2018-01-29 DIAGNOSIS — Z113 Encounter for screening for infections with a predominantly sexual mode of transmission: Secondary | ICD-10-CM

## 2018-01-29 DIAGNOSIS — N921 Excessive and frequent menstruation with irregular cycle: Secondary | ICD-10-CM | POA: Diagnosis not present

## 2018-01-29 DIAGNOSIS — Z124 Encounter for screening for malignant neoplasm of cervix: Secondary | ICD-10-CM

## 2018-01-29 MED ORDER — NORETHINDRONE 0.35 MG PO TABS: 1.0000 | ORAL_TABLET | Freq: Every day | ORAL | 1 refills | Status: DC

## 2018-01-29 NOTE — Progress Notes (Signed)
Gynecology Annual Exam  PCP: Alba Cory, MD  Chief Complaint:  Chief Complaint  Patient presents with  . Gynecologic Exam    History of Present Illness:Destiny Atkinson is a 42 year old Caucasian/White female, G2 P2002, who presents for her annual exam. She is having problems which include long, sometimes heavy menses on the progesterone only pill..  Her menses are regular and her LMP was 01/06/2018. They occur every month, last 9-14 days with 3-4 heavier days requiring tampon change every 1-2 hours.  She has had no spotting.  She reports dysmenorrhea throughout her menses, requiring Midol which is effective.  The patient's past medical history is notable for a history of asthma, allergic rhinitis, anxiety, depression, migraine without aura, Mobitz type 1 incomplete AV block, peripheral neuropathy, obesity, and hypertension.  Since her last annual GYN exam dated 11/15/2016, she has had no significant changes in her health history. She has been able to decrease her dose of Lexapro and her antihypertensive since she has less stress in her life. She is sexually active. She is currently using birth control pills for contraception.  Her most recent pap smear was obtained 11/15/2016 and was NIL/negative HRHPV  Her most recent mammogram obtained on 09/05/2013 was normal.  There is no family history of breast cancer.  There is a family history of ovarian cancer in her paternal great grandmother and paternal great aunt. Genetic testing has not been done.  The patient does do monthly self breast exams.  The patient smokes 10 cigs/day.  The patient does drink rarely.  The patient does not use illegal drugs.  The patient exercises regularly  The patient does get adequate calcium in her diet.  She had a recent cholesterol screen in 2018 by her PCP Dr Carlynn Purl that was borderline.     Review of Systems: Review of Systems  Constitutional: Positive for malaise/fatigue. Negative for chills,  fever and weight loss.  HENT: Negative for congestion, sinus pain and sore throat.   Eyes: Negative for blurred vision and pain.  Respiratory: Negative for hemoptysis, shortness of breath and wheezing.   Cardiovascular: Negative for chest pain, palpitations and leg swelling.  Gastrointestinal: Negative for abdominal pain, blood in stool, diarrhea, heartburn, nausea and vomiting.  Genitourinary: Negative for dysuria, frequency, hematuria and urgency.  Musculoskeletal: Negative for back pain, joint pain and myalgias.  Skin: Negative for itching and rash.  Neurological: Positive for headaches. Negative for dizziness and tingling.  Endo/Heme/Allergies: Positive for environmental allergies. Negative for polydipsia. Does not bruise/bleed easily.       Negative for hirsutism   Psychiatric/Behavioral: Negative for depression. The patient is not nervous/anxious and does not have insomnia.     Past Medical History:  Past Medical History:  Diagnosis Date  . Allergic rhinitis   . Anxiety   . Asthma   . B12 deficiency   . Decreased libido   . Hypertension   . Irregular menstrual bleeding   . Migraines    without aura  . Mild depression (HCC)   . Mobitz type 1 second degree atrioventricular block   . Peripheral neuropathy   . Tobacco use disorder     Past Surgical History:  Past Surgical History:  Procedure Laterality Date  . COLONOSCOPY     in 8th grade, not as adult  . ESOPHAGOGASTRODUODENOSCOPY     age 25  . NASAL SINUS SURGERY  01/21/2010   septoplasty/ sinus surgery  . WISDOM TOOTH EXTRACTION  age 65  Family History:  Family History  Problem Relation Age of Onset  . COPD Mother   . Fibromyalgia Mother   . Hypertension Mother   . Stroke Mother   . Asthma Mother   . Arthritis Mother   . Depression Mother   . Heart disease Mother   . Crohn's disease Mother   . Diabetes Father   . Epilepsy Brother   . Stomach cancer Paternal Grandmother [id number]  . Ovarian cancer Other 45    . Ovarian cancer Other 3850    Social History:  Social History   Socioeconomic History  . Marital status: Married    Spouse name: Not on file  . Number of children: 2  . Years of education: Not on file  . Highest education level: Not on file  Social Needs  . Financial resource strain: Not on file  . Food insecurity - worry: Not on file  . Food insecurity - inability: Not on file  . Transportation needs - medical: Not on file  . Transportation needs - non-medical: Not on file  Occupational History  . Occupation: Airline pilotproduction planning manager     Employer: [employer]  Tobacco Use  . Smoking status: Current Every Day Smoker    Packs/day: 0.50    Years: 20.00    Pack years: 10.00    Types: Cigarettes    Start date: 06/29/1995  . Smokeless tobacco: Never Used  Substance and Sexual Activity  . Alcohol use: Yes    Alcohol/week: 0.0 oz    Comment: rarely  . Drug use: No  . Sexual activity: Yes    Partners: Male    Birth control/protection: Pill  Other Topics Concern  . 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 Dog Epithelium     Anaphylaxis with Rabbits.  Dogs, cats, birds cause swelling, extreme itching.  . Dust Mite Extract   . Pollen Extract   . Soap   . Tape   . Tree Extract     Medications: Prior to Admission medications   Medication Sig Start Date End Date Taking? Authorizing Provider  albuterol (PROAIR HFA) 108 (90 Base) MCG/ACT inhaler Inhale 2 puffs into the lungs every 4 (four) hours as needed. 11/10/17   Doren CustardBoyce, Emily E, FNP  ALPRAZolam Prudy Feeler(XANAX) 0.5 MG tablet Take 1 tablet (0.5 mg total) by mouth 2 (two) times daily as needed. 08/17/17   Alba CorySowles, Krichna, MD  budesonide-formoterol (SYMBICORT) 160-4.5 MCG/ACT inhaler Inhale 2 puffs into the lungs 2 (two) times daily. Patient not taking: Reported on  11/10/2017 08/17/17   Alba CorySowles, Krichna, MD  EPINEPHrine (EPIPEN 2-PAK) 0.3 mg/0.3 mL IJ SOAJ injection Inject 0.3 mLs (0.3 mg total) into the muscle once. 11/17/15   Alba CorySowles, Krichna, MD  escitalopram (LEXAPRO) 5 MG tablet Take 1 tablet (5 mg total) by mouth daily. 08/17/17   Alba CorySowles, Krichna, MD  ipratropium-albuterol (DUONEB) 0.5-2.5 (3) MG/3ML SOLN Take 3 mLs by nebulization every 6 (six) hours as needed. 11/10/17   Doren CustardBoyce, Emily E, FNP  levocetirizine (XYZAL) 5 MG tablet Take 1 tablet (5 mg total) by mouth daily. 08/17/17   Alba CorySowles, Krichna, MD  losartan-hydrochlorothiazide (HYZAAR) 50-12.5 MG tablet Take 1 tablet by mouth daily. 08/17/17   Alba CorySowles, Krichna, MD  montelukast (SINGULAIR) 10 MG tablet Take 1 tablet (10 mg total) by mouth daily.  08/17/17   Alba Cory, MD  norethindrone (HEATHER) 0.35 MG tablet Take 1 tablet (0.35 mg total) by mouth daily. 01/15/18   Farrel Conners, CNM  rizatriptan (MAXALT-MLT) 10 MG disintegrating tablet Take 1 tablet (10 mg total) as needed by mouth for migraine. 09/27/17   Alba Cory, MD    Physical Exam Vitals: BP 100/60   Pulse 88   Ht 5' 6.5" (1.689 m)   Wt 197 lb (89.4 kg)   LMP 01/06/2018 (Exact Date)   BMI 31.32 kg/m    General: WF in NAD HEENT: normocephalic, anicteric Neck: no thyroid enlargement, no palpable nodules, no cervical lymphadenopathy  Pulmonary: No increased work of breathing, CTAB Cardiovascular: RRR, without murmur  Breast: Breast symmetrical, no tenderness, no palpable nodules or masses, no skin or nipple retraction present, no nipple discharge.  No axillary, infraclavicular or supraclavicular lymphadenopathy. Abdomen: Soft, non-tender, non-distended.  Umbilicus without lesions.  No hepatomegaly or masses palpable. No evidence of hernia. Genitourinary:  External: Normal external female genitalia.  Normal urethral meatus, normal Bartholin's and Skene's glands.  Two light brown moles on right perineal area (no change)  Vagina:  Normal vaginal mucosa, no evidence of prolapse.    Cervix: Grossly normal in appearance, no bleeding, non-tender, posterior  Uterus: Anteflexed, normal size, shape, and consistency, mobile, and non-tender  Adnexa: No adnexal masses, non-tender  Rectal: deferred  Lymphatic: no evidence of inguinal lymphadenopathy Extremities: no edema, erythema, or tenderness Neurologic: Grossly intact Psychiatric: mood appropriate, affect full     Assessment: 42 y.o. G2 P2 for annual gyn exam Prolonged heavy menses on the progesterone only pill.   Plan:   1) Breast cancer screening - recommend monthly self breast exam and annual screening mammograms.. Mammogram was ordered today.  2) Discussed other non estrogen options for contraception: Nexplanon, IUD, Depo. Patient interested in progesterone containing IUD. Discussed MOA, effectiveness, possible side effects and risks of perforation, expulsion, irregular bleeding or amenorrhea, and infection from insertion. She will check coverage with her insurance. If covered, plans on Liletta insertion with her next menses.  3) Cervical cancer screening - Pap was done. ASCCP guidelines and rational discussed.  Patient opts for yearly screening interval  4) Routine healthcare maintenance including cholesterol and diabetes screening managed by PCP    Farrel Conners, CNM

## 2018-02-01 LAB — PAP IG, CT-NG, RFX HPV ALL
Chlamydia, Nuc. Acid Amp: NEGATIVE
Gonococcus by Nucleic Acid Amp: NEGATIVE
PAP Smear Comment: 0

## 2018-02-09 ENCOUNTER — Other Ambulatory Visit: Payer: Self-pay | Admitting: Family Medicine

## 2018-02-09 DIAGNOSIS — J3089 Other allergic rhinitis: Secondary | ICD-10-CM

## 2018-03-08 ENCOUNTER — Other Ambulatory Visit: Payer: Self-pay | Admitting: Family Medicine

## 2018-03-08 NOTE — Telephone Encounter (Signed)
She needs to be seen, last refill

## 2018-03-29 ENCOUNTER — Telehealth: Payer: Self-pay | Admitting: Certified Nurse Midwife

## 2018-03-29 NOTE — Telephone Encounter (Signed)
Patient is schedule 04/03/18 with CLG for Lyletta insertion at 1:50

## 2018-04-03 ENCOUNTER — Encounter: Payer: Self-pay | Admitting: Certified Nurse Midwife

## 2018-04-03 ENCOUNTER — Ambulatory Visit (INDEPENDENT_AMBULATORY_CARE_PROVIDER_SITE_OTHER): Payer: 59 | Admitting: Certified Nurse Midwife

## 2018-04-03 VITALS — BP 118/68 | HR 80 | Ht 67.0 in | Wt 194.0 lb

## 2018-04-03 DIAGNOSIS — Z30430 Encounter for insertion of intrauterine contraceptive device: Secondary | ICD-10-CM | POA: Diagnosis not present

## 2018-04-04 MED ORDER — LEVONORGESTREL 19.5 MCG/DAY IU IUD: 1.0000 | INTRAUTERINE_SYSTEM | Freq: Once | INTRAUTERINE | 0 refills | Status: DC

## 2018-04-04 NOTE — Progress Notes (Signed)
    GYNECOLOGY OFFICE PROCEDURE NOTE  Destiny Atkinson is a 42 y.o. [id number] here for Liletta IUD insertion to help long, sometimes heavy menses on the progesterone only pill. The menses occur every month, last 9-14 days with 3-4 heavier days requiring tampon change every 1-2 hours.   Last pap smear was on 01/29/2018 and was normal.  IUD Insertion Procedure Note Patient identified, informed consent performed, consent signed.   Discussed risks of irregular bleeding, cramping, infection, expulsion,malpositioning or misplacement of the IUD outside the uterus which may require further procedure such as laparoscopy. Time out was performed.    On bimanual exam, uterus was Anteverted Speculum placed in the vagina.  Cervix visualized.  Cleaned with Betadine x 3. Cervix was sprayed with Hurricaine anesthetic and  grasped anteriorly with a single tooth tenaculum.  Uterus sounded to 7 cm.  Liletta  IUD placed per manufacturer's recommendations.  Strings trimmed to 3 cm. Tenaculum was removed, and there was no bleeding from the tenaculum sites.  Patient tolerated procedure well.   Patient was given post-procedure instructions.  She was advised to finish her current pack of POP.  Patient was also asked to check IUD strings periodically and follow up in 4 weeks for IUD check.  Farrel Conners, CNM 04/04/18

## 2018-04-17 NOTE — Telephone Encounter (Signed)
Liletta received & charged 04/03/18

## 2018-05-02 ENCOUNTER — Telehealth: Payer: Self-pay | Admitting: Family Medicine

## 2018-05-02 DIAGNOSIS — I10 Essential (primary) hypertension: Secondary | ICD-10-CM

## 2018-05-02 DIAGNOSIS — F411 Generalized anxiety disorder: Secondary | ICD-10-CM

## 2018-05-02 DIAGNOSIS — J3089 Other allergic rhinitis: Secondary | ICD-10-CM

## 2018-05-03 MED ORDER — ESCITALOPRAM OXALATE 10 MG PO TABS: 10.0000 mg | ORAL_TABLET | Freq: Every day | ORAL | 0 refills | Status: DC

## 2018-05-03 NOTE — Telephone Encounter (Signed)
Not seen since Dec 2018, must be seen tomorrow

## 2018-05-03 NOTE — Telephone Encounter (Signed)
Spoke with pt and appt made for 06-14-18 (your first availability)

## 2018-05-04 ENCOUNTER — Encounter: Payer: Self-pay | Admitting: Certified Nurse Midwife

## 2018-05-04 ENCOUNTER — Ambulatory Visit (INDEPENDENT_AMBULATORY_CARE_PROVIDER_SITE_OTHER): Payer: 59 | Admitting: Certified Nurse Midwife

## 2018-05-04 ENCOUNTER — Ambulatory Visit (INDEPENDENT_AMBULATORY_CARE_PROVIDER_SITE_OTHER): Payer: 59

## 2018-05-04 VITALS — BP 108/62 | HR 80 | Ht 67.0 in | Wt 193.0 lb

## 2018-05-04 DIAGNOSIS — Z30431 Encounter for routine checking of intrauterine contraceptive device: Secondary | ICD-10-CM | POA: Diagnosis not present

## 2018-05-04 DIAGNOSIS — R1032 Left lower quadrant pain: Secondary | ICD-10-CM | POA: Diagnosis not present

## 2018-05-04 DIAGNOSIS — N83201 Unspecified ovarian cyst, right side: Secondary | ICD-10-CM

## 2018-05-04 MED ORDER — LOSARTAN POTASSIUM-HCTZ 50-12.5 MG PO TABS: 1.0000 | ORAL_TABLET | Freq: Every day | ORAL | 0 refills | Status: DC

## 2018-05-04 MED ORDER — MONTELUKAST SODIUM 10 MG PO TABS: 10.0000 mg | ORAL_TABLET | Freq: Every day | ORAL | 0 refills | Status: DC

## 2018-05-04 NOTE — Progress Notes (Signed)
  History of Present Illness:  Destiny Atkinson is a 42 y.o. that had a Liletta IUD placed approximately 1 month ago for treatment of prolonged menses and for contraception. Since that time, she states that she has had intermittent sharp LLQ pains and she has bled continuously since the insertion. The bleeding was moderate for a few days, but has otherwise been light-requiring a miniliner  PMHx: She  has a past medical history of Allergic rhinitis, Anxiety, Asthma, B12 deficiency, Decreased libido, Hypertension, Irregular menstrual bleeding, Migraines, Mild depression (HCC), Mobitz type 1 second degree atrioventricular block, Peripheral neuropathy, and Tobacco use disorder. Also,  has a past surgical history that includes Nasal sinus surgery (01/21/2010); Colonoscopy; Esophagogastroduodenoscopy; and Wisdom tooth extraction (age 42)., family history includes Arthritis in her mother; Asthma in her mother; COPD in her mother; Crohn's disease in her mother; Depression in her mother; Diabetes in her father; Epilepsy in her brother; Fibromyalgia in her mother; Heart disease in her mother; Hypertension in her mother; Ovarian cancer (age of onset: 2845) in her other; Ovarian cancer (age of onset: 2650) in her other; Stomach cancer (age of onset: 7975) in her paternal grandmother; Stroke in her mother.,  reports that she has been smoking cigarettes.  She started smoking about 22 years ago. She has a 10.00 pack-year smoking history. She has never used smokeless tobacco. She reports that she drinks alcohol. She reports that she does not use drugs.  She has a current medication list which includes the following prescription(s): albuterol, alprazolam, budesonide-formoterol, epinephrine, escitalopram, ipratropium-albuterol, levocetirizine, losartan-hydrochlorothiazide, montelukast, rizatriptan, and levonorgestrel. Also, is allergic to dog epithelium; dust mite extract; pollen extract; soap; tape; and tree  extract.  ROS  Physical Exam:  BP 108/62   Pulse 80   Ht 5\' 7"  (1.702 m)   Wt 193 lb (87.5 kg)   BMI 30.23 kg/m  Body mass index is 30.23 kg/m. Constitutional: Well nourished, well developed female in no acute distress.  Neuro: Grossly intact Psych:  Normal mood and affect.    Pelvic exam: External/BUS: no lesion, no discharge Vagina: no lesions, small amount bloody mucous in vault Cervix: Two IUD strings present seen coming from the cervical os. Uterus: AV, NSSC, mobile, NT Adnexa: no masses, NT   Assessment: IUD strings present in proper location Intermittent LLQ pain  Plan: TVUS was performed. The IUD was seen in the correct location. The endometrium measures 7.655mm. There was a cystic structure in the right ovary (?septate cyst) measuring 3.16cm Reviewed results of ultrasound with patient. Recommend waiting another month or so to see if bleeding resolves. Would like to repeat ultrasound in 4-6 weeks to follow up on right ovarian cyst and to see if bleeding resolves.  Patient amenable to plan.  Destiny Atkinson, CNM

## 2018-05-11 ENCOUNTER — Ambulatory Visit (INDEPENDENT_AMBULATORY_CARE_PROVIDER_SITE_OTHER): Payer: 59 | Admitting: Family Medicine

## 2018-05-11 ENCOUNTER — Other Ambulatory Visit: Payer: Self-pay | Admitting: Family Medicine

## 2018-05-11 ENCOUNTER — Encounter: Payer: Self-pay | Admitting: Family Medicine

## 2018-05-11 VITALS — BP 120/78 | HR 77 | Resp 16 | Ht 67.0 in | Wt 192.4 lb

## 2018-05-11 DIAGNOSIS — G629 Polyneuropathy, unspecified: Secondary | ICD-10-CM

## 2018-05-11 DIAGNOSIS — J3089 Other allergic rhinitis: Secondary | ICD-10-CM

## 2018-05-11 DIAGNOSIS — Z720 Tobacco use: Secondary | ICD-10-CM | POA: Diagnosis not present

## 2018-05-11 DIAGNOSIS — E669 Obesity, unspecified: Secondary | ICD-10-CM | POA: Diagnosis not present

## 2018-05-11 DIAGNOSIS — G43009 Migraine without aura, not intractable, without status migrainosus: Secondary | ICD-10-CM

## 2018-05-11 DIAGNOSIS — I10 Essential (primary) hypertension: Secondary | ICD-10-CM | POA: Diagnosis not present

## 2018-05-11 DIAGNOSIS — E8881 Metabolic syndrome: Secondary | ICD-10-CM

## 2018-05-11 DIAGNOSIS — F321 Major depressive disorder, single episode, moderate: Secondary | ICD-10-CM

## 2018-05-11 DIAGNOSIS — F411 Generalized anxiety disorder: Secondary | ICD-10-CM

## 2018-05-11 DIAGNOSIS — E538 Deficiency of other specified B group vitamins: Secondary | ICD-10-CM | POA: Diagnosis not present

## 2018-05-11 DIAGNOSIS — E559 Vitamin D deficiency, unspecified: Secondary | ICD-10-CM

## 2018-05-11 DIAGNOSIS — J4540 Moderate persistent asthma, uncomplicated: Secondary | ICD-10-CM | POA: Diagnosis not present

## 2018-05-11 DIAGNOSIS — E785 Hyperlipidemia, unspecified: Secondary | ICD-10-CM | POA: Diagnosis not present

## 2018-05-11 DIAGNOSIS — E66811 Obesity, class 1: Secondary | ICD-10-CM

## 2018-05-11 MED ORDER — ALBUTEROL SULFATE HFA 108 (90 BASE) MCG/ACT IN AERS: 2.0000 | INHALATION_SPRAY | RESPIRATORY_TRACT | 0 refills | Status: DC | PRN

## 2018-05-11 MED ORDER — HYDROCHLOROTHIAZIDE 12.5 MG PO TABS
12.5000 mg | ORAL_TABLET | Freq: Every day | ORAL | 1 refills | Status: DC
Start: 2018-05-11 — End: 2018-11-12

## 2018-05-11 MED ORDER — ESCITALOPRAM OXALATE 10 MG PO TABS: 10.0000 mg | ORAL_TABLET | Freq: Every day | ORAL | 1 refills | Status: DC

## 2018-05-11 MED ORDER — MONTELUKAST SODIUM 10 MG PO TABS: 10.0000 mg | ORAL_TABLET | Freq: Every day | ORAL | 0 refills | Status: DC

## 2018-05-11 MED ORDER — ALPRAZOLAM 0.5 MG PO TABS: 0.5000 mg | ORAL_TABLET | Freq: Two times a day (BID) | ORAL | 0 refills | Status: DC | PRN

## 2018-05-11 MED ORDER — LEVOCETIRIZINE DIHYDROCHLORIDE 5 MG PO TABS: 5.0000 mg | ORAL_TABLET | Freq: Every day | ORAL | 1 refills | Status: AC

## 2018-05-11 NOTE — Progress Notes (Signed)
Name: Destiny Atkinson   MRN: [id number]    DOB: 09-21-1976   Date:05/11/2018       Progress Note  Subjective  Chief Complaint  Chief Complaint  Patient presents with  . Medication Refill  . Depression  . Hypertension  . Anxiety    needs Xanax refilled    HPI   Migraine : very seldom now, doing better.. She states she has phonophobia, and also photophobia. No nausea or vomiting. Migraine is described as a sharp pain over left eye and radiates to left temporal area and sometimes nuchal area.  She takes Maxalt  prn and states it works within 30 minutes.   HTN: she is taking losartan/ hctz however did not take medication today and bp is within normal range, she has noticed intermittent dizziness. We will switch to HcTZ only for now and monitor. No chest pain    GAD/Major Depression: she left [employer] and started a new job at [employer] in [place] back in May 2017, she left [employer] march 2018 when she walked away from her job,  and was unemployed until 06/2017. She is now back on textiles, she was doing well until recent management change and is feeling overwhelmed , working as a Designer, industrial/productwarehouse manager, long hours and doing multiple jobs since a lot of the employees were fired. She is back on Lexapro she has been taking 5 mg but we will increase to 10 mg daily. Phq 9 is high today but GAD is okay   RLS/Peripheral Neuropathy: She has a need to move her legs when sitting down, also states that at night her legs moves constantly. She also states than when she stands up for a long time her legs feels sore/a discomfort sensation that goes from her buttocks down to her legs. She states prickling sensation from her feet to her toes had resolved but returned when she stopped taking B12 , she resumed medication a few weeks ago and is doing better again. Recheck levels  AR: she his allergic to cats, but has 3 cats at home, also allergic to dogs and has one of them, uses Rhinochort otc, xyzal and  singulair. Still wakes up with puffy eyes every morning, she has rhinorrhea, nasal congestion,, but states doing better since kids are cleaning house better.   Asthma: she still smokes, not ready to quit, cough has improved but has , daily wheezing worse in am's ( may be related to allergy to her animals), she used to see Dr. Welton FlakesKhan, she was on Breo, but it caused oral candidiasis. She stopped symbicort on her own, but explained importance of daily regiment. Also needs to quit smoking   Dyslipidemia: last labs showed low HDL, high LDL and high triglycerides, she has been more active and eating a healthier diet, we will recheck labs  Metabolic syndrome: labs hgbA1C was 6.1, she has lost 7 lbs since last visit, more active and eating healthier, recheck labs.   Patient Active Problem List   Diagnosis Date Noted  . Pre-diabetes 11/17/2017  . Vitamin D deficiency 12/28/2016  . Mild major depression (HCC) 11/17/2015  . GAD (generalized anxiety disorder) 05/06/2015  . Asthma, moderate persistent 05/06/2015  . Mobitz type I incomplete atrioventricular block 05/06/2015  . B12 deficiency 05/06/2015  . Essential (primary) hypertension 05/06/2015  . Irregular bleeding 05/06/2015  . Migraine without aura and responsive to treatment 05/06/2015  . Lack of erotic interest 05/06/2015  . BMI 31.0-31.9,adult 05/06/2015  . Peripheral neuropathy 05/06/2015  .  Perennial allergic rhinitis 05/06/2015  . Restless leg 05/06/2015  . Tobacco abuse 05/06/2015    Past Surgical History:  Procedure Laterality Date  . COLONOSCOPY     in 8th grade, not as adult  . ESOPHAGOGASTRODUODENOSCOPY     age 89  . NASAL SINUS SURGERY  01/21/2010   septoplasty/ sinus surgery  . WISDOM TOOTH EXTRACTION  age 33    Family History  Problem Relation Age of Onset  . COPD Mother   . Fibromyalgia Mother   . Hypertension Mother   . Stroke Mother   . Asthma Mother   . Arthritis Mother   . Depression Mother   . Heart  disease Mother   . Crohn's disease Mother   . Diabetes Father   . Epilepsy Brother   . Stomach cancer Paternal Grandmother 64  . Ovarian cancer Other 45  . Ovarian cancer Other 50    Social History   Socioeconomic History  . Marital status: Married    Spouse name: Not on file  . Number of children: 2  . Years of education: Not on file  . Highest education level: Some college, no degree  Occupational History  . Occupation: Associate Professor: [employer]  Social Needs  . Financial resource strain: Hard  . Food insecurity:    Worry: Never true    Inability: Never true  . Transportation needs:    Medical: No    Non-medical: No  Tobacco Use  . Smoking status: Current Every Day Smoker    Packs/day: 0.50    Years: 20.00    Pack years: 10.00    Types: Cigarettes    Start date: 06/29/1995  . Smokeless tobacco: Never Used  Substance and Sexual Activity  . Alcohol use: Yes    Alcohol/week: 0.0 oz    Comment: rarely  . Drug use: No  . Sexual activity: Yes    Partners: Male    Birth control/protection: Pill  Lifestyle  . Physical activity:    Days per week: 3 days    Minutes per session: 20 min  . Stress: Not at all  Relationships  . Social connections:    Talks on phone: Three times a week    Gets together: Not on file    Attends religious service: Never    Active member of club or organization: No    Attends meetings of clubs or organizations: Never    Relationship status: Married  . Intimate partner violence:    Fear of current or ex partner: No    Emotionally abused: No    Physically abused: No    Forced sexual activity: No  Other Topics Concern  . 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  albuterol (PROAIR HFA) 108 (90 Base) MCG/ACT inhaler, Inhale 2 puffs into the lungs every 4 (four) hours as  needed., Disp: 1 Inhaler, Rfl: 0 .  ALPRAZolam (XANAX) 0.5 MG tablet, Take 1 tablet (0.5 mg total) by mouth 2 (two) times daily as needed., Disp: 30 tablet, Rfl: 0 .  budesonide-formoterol (SYMBICORT) 160-4.5 MCG/ACT inhaler, Inhale 2 puffs into the lungs 2 (two) times daily., Disp: 1 Inhaler, Rfl: 2 .  EPINEPHrine (EPIPEN 2-PAK) 0.3 mg/0.3 mL IJ SOAJ injection, Inject 0.3 mLs (0.3 mg total) into the muscle once., Disp: 2  Device, Rfl: 1 .  escitalopram (LEXAPRO) 10 MG tablet, Take 1 tablet (10 mg total) by mouth daily., Disp: 90 tablet, Rfl: 1 .  ipratropium-albuterol (DUONEB) 0.5-2.5 (3) MG/3ML SOLN, Take 3 mLs by nebulization every 6 (six) hours as needed., Disp: 360 mL, Rfl: 1 .  levocetirizine (XYZAL) 5 MG tablet, Take 1 tablet (5 mg total) by mouth daily., Disp: 90 tablet, Rfl: 1 .  montelukast (SINGULAIR) 10 MG tablet, Take 1 tablet (10 mg total) by mouth daily., Disp: 90 tablet, Rfl: 0 .  rizatriptan (MAXALT-MLT) 10 MG disintegrating tablet, Take 1 tablet (10 mg total) as needed by mouth for migraine., Disp: 9 tablet, Rfl: 0 .  hydrochlorothiazide (HYDRODIURIL) 12.5 MG tablet, Take 1 tablet (12.5 mg total) by mouth daily., Disp: 90 tablet, Rfl: 1 .  Levonorgestrel (LILETTA, 52 MG,) 19.5 MCG/DAY IUD IUD, 1 Intra Uterine Device (1 each total) by Intrauterine route once for 1 dose., Disp: 1 each, Rfl: 0  Allergies  Allergen Reactions  . Dog Epithelium     Anaphylaxis with Rabbits.  Dogs, cats, birds cause swelling, extreme itching.  . Dust Mite Extract   . Pollen Extract   . Soap   . Tape   . Tree Extract      ROS  Constitutional: Negative for fever, positive for weight change.  Respiratory: Negative for cough and shortness of breath.   Cardiovascular: Negative for chest pain or palpitations.  Gastrointestinal: Negative for abdominal pain, no bowel changes.  Musculoskeletal: Negative for gait problem or joint swelling.  Skin: Negative for rash.  Neurological: Negative for  dizziness, positive for intermittent  headache.  No other specific complaints in a complete review of systems (except as listed in HPI above).  Objective  Vitals:   05/11/18 1358  BP: 120/78  Pulse: 77  Resp: 16  SpO2: 95%  Weight: 192 lb 6.4 oz (87.3 kg)  Height: 5\' 7"  (1.702 m)    Body mass index is 30.13 kg/m.  Physical Exam  Constitutional: Patient appears well-developed and well-nourished. Obese No distress.  HEENT: head atraumatic, normocephalic, pupils equal and reactive to light,neck supple, throat within normal limits Cardiovascular: Normal rate, regular rhythm and normal heart sounds.  No murmur heard. No BLE edema. Pulmonary/Chest: Effort normal and breath sounds normal. No respiratory distress. Abdominal: Soft.  There is no tenderness. Psychiatric: Patient has a normal mood and affect. behavior is normal. Judgment and thought content normal.   PHQ2/9: Depression screen [facility] 2/9 05/11/2018 11/10/2017 08/17/2017 12/28/2016 05/03/2016  Decreased Interest 1 0 0 1 0  Down, Depressed, Hopeless 1 1 0 1 0  PHQ - 2 Score 2 1 0 2 0  Altered sleeping 2 - - 3 -  Tired, decreased energy 3 - - 2 -  Change in appetite 2 - - 0 -  Feeling bad or failure about yourself  1 - - 1 -  Trouble concentrating 1 - - 1 -  Moving slowly or fidgety/restless 0 - - 3 -  Suicidal thoughts 0 - - 0 -  PHQ-9 Score 11 - - 12 -  Difficult doing work/chores Not difficult at all - - Somewhat difficult -   GAD 7 : Generalized Anxiety Score 05/11/2018 12/28/2016  Nervous, Anxious, on Edge 2 3  Control/stop worrying 1 3  Worry too much - different things 2 3  Trouble relaxing 2 3  Restless 2 3  Easily annoyed or irritable 1 1  Afraid - awful might happen 0 3  Total GAD  7 Score 10 19  Anxiety Difficulty Somewhat difficult Somewhat difficult      Fall Risk: Fall Risk  05/11/2018 11/10/2017 08/17/2017 12/28/2016 05/03/2016  Falls in the past year? No No No No No     Functional Status Survey: Is the  patient deaf or have difficulty hearing?: No Does the patient have difficulty seeing, even when wearing glasses/contacts?: No Does the patient have difficulty concentrating, remembering, or making decisions?: No Does the patient have difficulty walking or climbing stairs?: No Does the patient have difficulty dressing or bathing?: No Does the patient have difficulty doing errands alone such as visiting a doctor's office or shopping?: No   Assessment & Plan    1. Moderate persistent asthma without complication  - albuterol (PROAIR HFA) 108 (90 Base) MCG/ACT inhaler; Inhale 2 puffs into the lungs every 4 (four) hours as needed.  Dispense: 1 Inhaler; Refill: 0 Needs to resume symbicort since having daily symptoms  -spirometry abnormal but improved with albuterol, needs to resume symbicort daily   2. GAD (generalized anxiety disorder)  - ALPRAZolam (XANAX) 0.5 MG tablet; Take 1 tablet (0.5 mg total) by mouth 2 (two) times daily as needed.  Dispense: 30 tablet; Refill: 0 - escitalopram (LEXAPRO) 10 MG tablet; Take 1 tablet (10 mg total) by mouth daily.  Dispense: 90 tablet; Refill: 1  3. Perennial allergic rhinitis  - levocetirizine (XYZAL) 5 MG tablet; Take 1 tablet (5 mg total) by mouth daily.  Dispense: 90 tablet; Refill: 1 - montelukast (SINGULAIR) 10 MG tablet; Take 1 tablet (10 mg total) by mouth daily.  Dispense: 90 tablet; Refill: 0  4. Essential hypertension  bp normal and did not take medication today, we will stop losartan and continue hctz and monitor  - COMPLETE METABOLIC PANEL WITH GFR - hydrochlorothiazide (HYDRODIURIL) 12.5 MG tablet; Take 1 tablet (12.5 mg total) by mouth daily.  Dispense: 90 tablet; Refill: 1  5. B12 deficiency  - B12  6. Moderate major depression (HCC)  On Lexapro   7. Migraine without aura and responsive to treatment  Still has maxalt at home   8. Vitamin D deficiency  Needs to resume  Vitamin D supplementation   9. Obesity (BMI  30.0-34.9)  Discussed with the patient the risk posed by an increased BMI. Discussed importance of portion control, calorie counting and at least 150 minutes of physical activity weekly. Avoid sweet beverages and drink more water. Eat at least 6 servings of fruit and vegetables daily  She has been losing weight   10. Peripheral polyneuropathy  Re-started having symptoms and is back on b12 supplements again   11. Tobacco abuse  Not ready to quit   12. Dyslipidemia  - Lipid panel  13. Metabolic syndrome  - Hemoglobin A1c

## 2018-05-12 LAB — COMPLETE METABOLIC PANEL WITH GFR
AG RATIO: 2.1 (calc) (ref 1.0–2.5)
ALBUMIN MSPROF: 4.7 g/dL (ref 3.6–5.1)
ALT: 12 U/L (ref 6–29)
AST: 17 U/L (ref 10–30)
Alkaline phosphatase (APISO): 68 U/L (ref 33–115)
BUN: 9 mg/dL (ref 7–25)
CALCIUM: 9.5 mg/dL (ref 8.6–10.2)
CO2: 26 mmol/L (ref 20–32)
Chloride: 105 mmol/L (ref 98–110)
Creat: 0.77 mg/dL (ref 0.50–1.10)
GFR, EST AFRICAN AMERICAN: 112 mL/min/{1.73_m2} (ref >=60)
GFR, EST NON AFRICAN AMERICAN: 97 mL/min/{1.73_m2} (ref >=60)
GLOBULIN: 2.2 g/dL (ref 1.9–3.7)
Glucose, Bld: 90 mg/dL (ref 65–139)
Potassium: 4.1 mmol/L (ref 3.5–5.3)
SODIUM: 139 mmol/L (ref 135–146)
TOTAL PROTEIN: 6.9 g/dL (ref 6.1–8.1)
Total Bilirubin: 0.6 mg/dL (ref 0.2–1.2)

## 2018-05-12 LAB — HEMOGLOBIN A1C
EAG (MMOL/L): 6.6 (calc)
Hgb A1c MFr Bld: 5.8 % of total Hgb — ABNORMAL HIGH (ref <5.7)
Mean Plasma Glucose: 120 (calc)

## 2018-05-12 LAB — LIPID PANEL
CHOLESTEROL: 199 mg/dL (ref <200)
HDL: 43 mg/dL — AB (ref >50)
LDL Cholesterol (Calc): 123 mg/dL (calc) — ABNORMAL HIGH
Non-HDL Cholesterol (Calc): 156 mg/dL (calc) — ABNORMAL HIGH (ref <130)
TRIGLYCERIDES: 209 mg/dL — AB (ref <150)
Total CHOL/HDL Ratio: 4.6 (calc) (ref <5.0)

## 2018-05-12 LAB — VITAMIN D 25 HYDROXY (VIT D DEFICIENCY, FRACTURES): VIT D 25 HYDROXY: 14 ng/mL — AB (ref 30–100)

## 2018-05-12 LAB — VITAMIN B12: Vitamin B-12: 623 pg/mL (ref 200–1100)

## 2018-05-14 ENCOUNTER — Other Ambulatory Visit: Payer: Self-pay | Admitting: Family Medicine

## 2018-05-14 MED ORDER — VITAMIN D (ERGOCALCIFEROL) 1.25 MG (50000 UNIT) PO CAPS: 50000.0000 [IU] | ORAL_CAPSULE | ORAL | 0 refills | Status: DC

## 2018-06-14 ENCOUNTER — Ambulatory Visit (INDEPENDENT_AMBULATORY_CARE_PROVIDER_SITE_OTHER): Payer: 59 | Admitting: Certified Nurse Midwife

## 2018-06-14 ENCOUNTER — Ambulatory Visit (INDEPENDENT_AMBULATORY_CARE_PROVIDER_SITE_OTHER): Payer: 59

## 2018-06-14 ENCOUNTER — Encounter: Payer: Self-pay | Admitting: Certified Nurse Midwife

## 2018-06-14 ENCOUNTER — Ambulatory Visit: Payer: 59 | Admitting: Family Medicine

## 2018-06-14 VITALS — BP 110/70 | HR 78 | Ht 67.0 in | Wt 192.0 lb

## 2018-06-14 DIAGNOSIS — N8301 Follicular cyst of right ovary: Secondary | ICD-10-CM

## 2018-06-14 DIAGNOSIS — Z975 Presence of (intrauterine) contraceptive device: Secondary | ICD-10-CM

## 2018-06-14 DIAGNOSIS — N83201 Unspecified ovarian cyst, right side: Secondary | ICD-10-CM

## 2018-06-14 DIAGNOSIS — R1032 Left lower quadrant pain: Secondary | ICD-10-CM

## 2018-06-14 DIAGNOSIS — N83202 Unspecified ovarian cyst, left side: Secondary | ICD-10-CM

## 2018-06-14 NOTE — Progress Notes (Signed)
  HPI: 42 year old G2 P2 who presents today for an ultrasound to follow up on the right septated ovarian cyst seen on an ultrasound done 05/04/2018 after she presented with c/o sharp LLQ pain one month after having a Mirena placed. She reports that her bleeding that she had after the IUD insertion stopped 2-3 weeks ago. She has not had any further LLQ pain  Ultrasound demonstrates that the IUD is in place. The right ovary appears normal with resolution of the septated cyst seen on the earlier ultrasound. On the left ovary there is a 3.28 x 2.16 simple cyst seen.   PMHx: She  has a past medical history of Allergic rhinitis, Anxiety, Asthma, B12 deficiency, Decreased libido, Hypertension, Irregular menstrual bleeding, Migraines, Mild depression (HCC), Mobitz type 1 second degree atrioventricular block, Peripheral neuropathy, and Tobacco use disorder. Also,  has a past surgical history that includes Nasal sinus surgery (01/21/2010); Colonoscopy; Esophagogastroduodenoscopy; and Wisdom tooth extraction (age 42)., family history includes Arthritis in her mother; Asthma in her mother; COPD in her mother; Crohn's disease in her mother; Depression in her mother; Diabetes in her father; Epilepsy in her brother; Fibromyalgia in her mother; Heart disease in her mother; Hypertension in her mother; Ovarian cancer (age of onset: 2245) in her other; Ovarian cancer (age of onset: 2050) in her other; Stomach cancer (age of onset: 6675) in her paternal grandmother; Stroke in her mother.,  reports that she has been smoking cigarettes.  She started smoking about 22 years ago. She has a 10.00 pack-year smoking history. She has never used smokeless tobacco. She reports that she drinks alcohol. She reports that she does not use drugs.  She has a current medication list which includes the following prescription(s): albuterol, alprazolam, budesonide-formoterol, epinephrine, escitalopram, hydrochlorothiazide, ipratropium-albuterol,  levocetirizine, montelukast, rizatriptan, vitamin d (ergocalciferol), and levonorgestrel. Also, is allergic to dog epithelium; dust mite extract; pollen extract; soap; tape; and tree extract.  ROS  Objective: BP 110/70   Pulse 78   Ht 5\' 7"  (1.702 m)   Wt 192 lb (87.1 kg)   LMP  (LMP Unknown)   BMI 30.07 kg/m   Physical examination Constitutional NAD, Conversant  Skin No rashes, lesions or ulceration.   Extremities: Moves all appropriately.  Normal ROM for age. No lymphadenopathy.  Neuro: Grossly intact  Psych: Oriented to PPT.  Normal mood. Normal affect.   Assessment: IUD in place Right septated ovarian cyst resolved  P: Follow up at the time of annual exam and sooner prn. Patient ID: Viviano SimasMelissa E Tipler, female   DOB: [birth year], 42 y.o.   MRN: [id number]

## 2018-09-21 ENCOUNTER — Encounter: Payer: Self-pay | Admitting: Family Medicine

## 2018-09-23 ENCOUNTER — Other Ambulatory Visit: Payer: Self-pay | Admitting: Family Medicine

## 2018-09-23 DIAGNOSIS — F411 Generalized anxiety disorder: Secondary | ICD-10-CM

## 2018-11-07 ENCOUNTER — Ambulatory Visit: Payer: 59 | Admitting: Family Medicine

## 2018-11-08 ENCOUNTER — Other Ambulatory Visit: Payer: Self-pay | Admitting: Family Medicine

## 2018-11-08 NOTE — Telephone Encounter (Signed)
Refill request for general medication: Vitamin D [id number] units  Last office visit: 05/12/2015   Last physical exam: None indicated   Follow-ups on file. 11/12/2018

## 2018-11-12 ENCOUNTER — Ambulatory Visit (INDEPENDENT_AMBULATORY_CARE_PROVIDER_SITE_OTHER): Payer: BLUE CROSS/BLUE SHIELD | Admitting: Family Medicine

## 2018-11-12 ENCOUNTER — Telehealth: Payer: Self-pay | Admitting: Family Medicine

## 2018-11-12 ENCOUNTER — Encounter: Payer: Self-pay | Admitting: Family Medicine

## 2018-11-12 VITALS — BP 118/70 | HR 85 | Temp 98.2°F | Resp 16 | Ht 67.0 in | Wt 192.9 lb

## 2018-11-12 DIAGNOSIS — E559 Vitamin D deficiency, unspecified: Secondary | ICD-10-CM

## 2018-11-12 DIAGNOSIS — F411 Generalized anxiety disorder: Secondary | ICD-10-CM

## 2018-11-12 DIAGNOSIS — J3089 Other allergic rhinitis: Secondary | ICD-10-CM

## 2018-11-12 DIAGNOSIS — G43009 Migraine without aura, not intractable, without status migrainosus: Secondary | ICD-10-CM

## 2018-11-12 DIAGNOSIS — Z23 Encounter for immunization: Secondary | ICD-10-CM

## 2018-11-12 DIAGNOSIS — I10 Essential (primary) hypertension: Secondary | ICD-10-CM | POA: Diagnosis not present

## 2018-11-12 DIAGNOSIS — J4540 Moderate persistent asthma, uncomplicated: Secondary | ICD-10-CM | POA: Diagnosis not present

## 2018-11-12 DIAGNOSIS — L989 Disorder of the skin and subcutaneous tissue, unspecified: Secondary | ICD-10-CM

## 2018-11-12 MED ORDER — EPINEPHRINE 0.3 MG/0.3ML IJ SOAJ: 0.3000 mg | Freq: Once | INTRAMUSCULAR | 1 refills | Status: AC

## 2018-11-12 MED ORDER — RIZATRIPTAN BENZOATE 10 MG PO TBDP: 10.0000 mg | ORAL_TABLET | ORAL | 0 refills | Status: DC | PRN

## 2018-11-12 MED ORDER — HYDROCHLOROTHIAZIDE 12.5 MG PO TABS: 12.5000 mg | ORAL_TABLET | Freq: Every day | ORAL | 1 refills | Status: DC

## 2018-11-12 MED ORDER — ALBUTEROL SULFATE 108 (90 BASE) MCG/ACT IN AEPB: 1.0000 | INHALATION_SPRAY | Freq: Every day | RESPIRATORY_TRACT | 0 refills | Status: DC

## 2018-11-12 MED ORDER — ALBUTEROL SULFATE 108 (90 BASE) MCG/ACT IN AEPB: 1.0000 | INHALATION_SPRAY | Freq: Four times a day (QID) | RESPIRATORY_TRACT | 0 refills | Status: DC | PRN

## 2018-11-12 MED ORDER — VITAMIN D (ERGOCALCIFEROL) 1.25 MG (50000 UNIT) PO CAPS: 50000.0000 [IU] | ORAL_CAPSULE | ORAL | 1 refills | Status: DC

## 2018-11-12 MED ORDER — ESCITALOPRAM OXALATE 10 MG PO TABS: 10.0000 mg | ORAL_TABLET | Freq: Every day | ORAL | 1 refills | Status: DC

## 2018-11-12 MED ORDER — BUDESONIDE-FORMOTEROL FUMARATE 160-4.5 MCG/ACT IN AERO: 2.0000 | INHALATION_SPRAY | Freq: Two times a day (BID) | RESPIRATORY_TRACT | 2 refills | Status: DC

## 2018-11-12 NOTE — Progress Notes (Signed)
Name: Destiny Atkinson   MRN: [id number]    DOB: Aug 13, 1976   Date:11/12/2018       Progress Note  Subjective  Chief Complaint  Chief Complaint  Patient presents with  . Asthma  . Anxiety  . Depression  . Hypertension    HPI  Migraine : She has one episode in about every 3 month. Not associated nausea or vomiting, but is associated with photophobia. Migraine is described as a sharp pain over left eye and radiates to left temporal area and sometimes nuchal area.She takes Maxalt  prn and states it works within 30 minutes.  HTN:we stopped Losartan back in June 2019 because of dizziness and is doing well on HCTZ by itself. No chest pain or dizziness.   GAD/Major Depression: she left [employer] and started a new job at [employer] in [place] back in May 2017, she left [employer] March 2018 when she walked away from her job, and was unemployed until 06/2017. 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 She is back on Lexapro 10 mg daily, she still worries about her oldest daughter that has depression, her husband does not understand depression  RLS/Peripheral Neuropathy: She has a need to move her legs when sitting down, also states that at night her legs moves constantly. She also states than when she stands up for a long time her legs feels sore/a discomfort sensation that goes from her buttocks down to her legs. She states prickling sensation from her feet to her toes had resolved with B12  AR: she his allergic to cats, but has 3 cats at home, also allergic to dogs and has one of them, she is off singulair and Rhinochort, taking loratadine otc  Still wakes up with puffy eyes every morning, she has rhinorrhea, nasal congestion, but doing better with a cleaner house  Asthma: she still smokes, she states not ready to quit but her employer is encouraging her to quit because insurance  premium will go up. She states cough has improved but has , she states since she started using Symbicort daily wheezing resolved, Could not tolerate Breo - it caused thrush   Dyslipidemia: reviewed labs with patient done 04/2018, still has high triglycerides and low HDL, trying to eat healthier. More fruit and vegetabls  Metabolic syndrome: labs hgbA1C was 6.1, she has lost 7 lbs since last visit, more active and eating healthier, last hgbA1C improved and it was down to 5.8%   Patient Active Problem List   Diagnosis Date Noted  . Pre-diabetes 11/17/2017  . Vitamin D deficiency 12/28/2016  . Mild major depression (HCC) 11/17/2015  . GAD (generalized anxiety disorder) 05/06/2015  . Asthma, moderate persistent 05/06/2015  . Mobitz type I incomplete atrioventricular block 05/06/2015  . B12 deficiency 05/06/2015  . Essential (primary) hypertension 05/06/2015  . Irregular bleeding 05/06/2015  . Migraine without aura and responsive to treatment 05/06/2015  . Lack of erotic interest 05/06/2015  . BMI 31.0-31.9,adult 05/06/2015  . Peripheral neuropathy 05/06/2015  . Perennial allergic rhinitis 05/06/2015  . Restless leg 05/06/2015  . Tobacco abuse 05/06/2015    Past Surgical History:  Procedure Laterality Date  . COLONOSCOPY     in 8th grade, not as adult  . ESOPHAGOGASTRODUODENOSCOPY     age 42  . NASAL SINUS SURGERY  01/21/2010   septoplasty/ sinus surgery  . WISDOM  TOOTH EXTRACTION  age 56    Family History  Problem Relation Age of Onset  . COPD Mother   . Fibromyalgia Mother   . Hypertension Mother   . Stroke Mother   . Asthma Mother   . Arthritis Mother   . Depression Mother   . Heart disease Mother   . Crohn's disease Mother   . Diabetes Father   . Epilepsy Brother   . Stomach cancer Paternal Grandmother 33  . Ovarian cancer Other 45  . Ovarian cancer Other 50    Social History   Socioeconomic History  . Marital status: Married    Spouse name: Not on file   . Number of children: 2  . Years of education: 44  . Highest education level: Some college, no degree  Occupational History  . Occupation: Associate Professor: [employer]  Social Needs  . Financial resource strain: Hard  . Food insecurity:    Worry: Never true    Inability: Never true  . Transportation needs:    Medical: No    Non-medical: No  Tobacco Use  . Smoking status: Current Every Day Smoker    Packs/day: 0.50    Years: 27.00    Pack years: 13.50    Types: Cigarettes, E-cigarettes    Start date: 06/29/1995  . Smokeless tobacco: Never Used  Substance and Sexual Activity  . Alcohol use: Yes    Alcohol/week: 0.0 standard drinks    Comment: rarely  . Drug use: No  . Sexual activity: Yes    Partners: Male    Birth control/protection: I.U.D.  Lifestyle  . Physical activity:    Days per week: 3 days    Minutes per session: 20 min  . Stress: Not at all  Relationships  . Social connections:    Talks on phone: Three times a week    Gets together: Not on file    Attends religious service: Never    Active member of club or organization: No    Attends meetings of clubs or organizations: Never    Relationship status: Married  . Intimate partner violence:    Fear of current or ex partner: No    Emotionally abused: No    Physically abused: No    Forced sexual activity: No  Other Topics Concern  . 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 Dog Epithelium     Anaphylaxis with Rabbits.  Dogs, cats, birds cause swelling, extreme itching.  . Dust Mite Extract   . Pollen Extract   . Soap   . Tape   . Tree Extract     I personally reviewed active problem list, medication list, allergies, family history, social history with the patient/caregiver today.   ROS  Constitutional: Negative for fever  or weight change.  Respiratory: Negative for cough and shortness of breath.   Cardiovascular: Negative for chest pain but has intermittent palpitations.  Gastrointestinal: Negative for abdominal pain, no bowel changes.  Musculoskeletal: Negative for gait problem or joint swelling.  Skin: Negative for rash.  Neurological: Negative for dizziness or headache.  No other specific complaints in a complete review of systems (except as listed in HPI above).  Objective  Vitals:   11/12/18 0800  BP: 118/70  Pulse: 85  Resp:  16  Temp: 98.2 F (36.8 C)  TempSrc: Oral  SpO2: 95%  Weight: 192 lb 14.4 oz (87.5 kg)  Height: 5\' 7"  (1.702 m)    Body mass index is 30.21 kg/m.  Physical Exam  Constitutional: Patient appears well-developed and well-nourished. Obese  No distress.  HEENT: head atraumatic, normocephalic, pupils equal and reactive to light,  neck supple, throat within normal limits Cardiovascular: Normal rate, regular rhythm and normal heart sounds.  No murmur heard. No BLE edema. Pulmonary/Chest: Effort normal and breath sounds normal. No respiratory distress. Abdominal: Soft.  There is no tenderness. Psychiatric: Patient has a normal mood and affect. behavior is normal. Judgment and thought content normal.  PHQ2/9: Depression screen [facility] 2/9 11/12/2018 05/11/2018 11/10/2017 08/17/2017 12/28/2016  Decreased Interest 0 1 0 0 1  Down, Depressed, Hopeless 1 1 1  0 1  PHQ - 2 Score 1 2 1  0 2  Altered sleeping 1 2 - - 3  Tired, decreased energy 1 3 - - 2  Change in appetite 0 2 - - 0  Feeling bad or failure about yourself  0 1 - - 1  Trouble concentrating 1 1 - - 1  Moving slowly or fidgety/restless 0 0 - - 3  Suicidal thoughts 0 0 - - 0  PHQ-9 Score 4 11 - - 12  Difficult doing work/chores Somewhat difficult Not difficult at all - - Somewhat difficult     Fall Risk: Fall Risk  11/12/2018 05/11/2018 11/10/2017 08/17/2017 12/28/2016  Falls in the past year? 0 No No No No  Number  falls in past yr: 0 - - - -  Injury with Fall? 0 - - - -      Assessment & Plan   1. Moderate persistent asthma without complication  - Albuterol Sulfate (PROAIR RESPICLICK) 108 (90 Base) MCG/ACT AEPB; Inhale 1 puff into the lungs daily.  Dispense: 1 each; Refill: 0 - budesonide-formoterol (SYMBICORT) 160-4.5 MCG/ACT inhaler; Inhale 2 puffs into the lungs 2 (two) times daily.  Dispense: 1 Inhaler; Refill: 2  2. Needs flu shot   3. GAD (generalized anxiety disorder)  - escitalopram (LEXAPRO) 10 MG tablet; Take 1 tablet (10 mg total) by mouth daily.  Dispense: 90 tablet; Refill: 1  4. Essential hypertension  - hydrochlorothiazide (HYDRODIURIL) 12.5 MG tablet; Take 1 tablet (12.5 mg total) by mouth daily.  Dispense: 90 tablet; Refill: 1  5. Perennial allergic rhinitis  - EPINEPHrine (EPIPEN 2-PAK) 0.3 mg/0.3 mL IJ SOAJ injection; Inject 0.3 mLs (0.3 mg total) into the muscle once for 1 dose.  Dispense: 2 Device; Refill: 1  6. Migraine without aura and responsive to treatment  - rizatriptan (MAXALT-MLT) 10 MG disintegrating tablet; Take 1 tablet (10 mg total) by mouth as needed for migraine.  Dispense: 9 tablet; Refill: 0  7. Vitamin D deficiency  - Vitamin D, Ergocalciferol, (DRISDOL) 1.25 MG (50000 UT) CAPS capsule; Take 1 capsule (50,000 Units total) by mouth once a week.  Dispense: 12 capsule; Refill: 1   8. Skin lesion of right arm  Present for the past 2 days, raised , smooth and flesh color, advised topical steroid and if no resolution call back for either shave biopsy or referral to dermatologist

## 2018-11-12 NOTE — Telephone Encounter (Signed)
Copied from CRM [phone]. Topic: Quick Communication - See Telephone Encounter >> Nov 12, 2018  9:01 AM Herby AbrahamJohnson, Shiquita C wrote: CRM for notification. See Telephone encounter for: 11/12/18.  Pharmacy called in to get clarity on Rx for Albuterol Sulfate (PROAIR RESPICLICK) 108 (90 Base) MCG/ACT AEPB, pharmacy states that this is a rescue inhaler, she said that the directions are usually not written as such.   Please advise.

## 2019-01-08 ENCOUNTER — Other Ambulatory Visit: Payer: Self-pay

## 2019-01-08 ENCOUNTER — Ambulatory Visit (INDEPENDENT_AMBULATORY_CARE_PROVIDER_SITE_OTHER): Payer: BLUE CROSS/BLUE SHIELD | Admitting: Family Medicine

## 2019-01-08 ENCOUNTER — Encounter: Payer: Self-pay | Admitting: Emergency Medicine

## 2019-01-08 ENCOUNTER — Encounter: Payer: Self-pay | Admitting: Family Medicine

## 2019-01-08 VITALS — BP 124/88 | HR 102 | Temp 98.8°F | Resp 16 | Ht 67.0 in | Wt 192.2 lb

## 2019-01-08 DIAGNOSIS — H66002 Acute suppurative otitis media without spontaneous rupture of ear drum, left ear: Secondary | ICD-10-CM

## 2019-01-08 DIAGNOSIS — B379 Candidiasis, unspecified: Secondary | ICD-10-CM

## 2019-01-08 DIAGNOSIS — J029 Acute pharyngitis, unspecified: Secondary | ICD-10-CM

## 2019-01-08 DIAGNOSIS — J4541 Moderate persistent asthma with (acute) exacerbation: Secondary | ICD-10-CM

## 2019-01-08 DIAGNOSIS — T3695XA Adverse effect of unspecified systemic antibiotic, initial encounter: Secondary | ICD-10-CM

## 2019-01-08 LAB — POCT RAPID STREP A (OFFICE): Rapid Strep A Screen: NEGATIVE

## 2019-01-08 MED ORDER — MAGIC MOUTHWASH W/LIDOCAINE: 5.0000 mL | Freq: Four times a day (QID) | ORAL | 0 refills | Status: DC | PRN

## 2019-01-08 MED ORDER — AMOXICILLIN-POT CLAVULANATE 875-125 MG PO TABS: 1.0000 | ORAL_TABLET | Freq: Two times a day (BID) | ORAL | 0 refills | Status: AC

## 2019-01-08 MED ORDER — PREDNISONE 20 MG PO TABS: 20.0000 mg | ORAL_TABLET | Freq: Two times a day (BID) | ORAL | 0 refills | Status: AC

## 2019-01-08 MED ORDER — FLUCONAZOLE 150 MG PO TABS: 150.0000 mg | ORAL_TABLET | Freq: Once | ORAL | 0 refills | Status: AC

## 2019-01-08 NOTE — Patient Instructions (Signed)
Cool Mist Vaporizer A cool mist vaporizer is a device that releases a cool mist into the air. If you have a cough or a cold, using a vaporizer may help relieve your symptoms. The mist adds moisture to the air, which may help thin your mucus and make it less sticky. When your mucus is thin and less sticky, it easier for you to breathe and to cough up secretions. Do not use a vaporizer if you are allergic to mold. Follow these instructions at home:  Follow the instructions that come with the vaporizer.  Do not use anything other than distilled water in the vaporizer.  Do not run the vaporizer all of the time. Doing that can cause mold or bacteria to grow in the vaporizer.  Clean the vaporizer after each time that you use it.  Clean and dry the vaporizer well before storing it.  Stop using the vaporizer if your breathing symptoms get worse. This information is not intended to replace advice given to you by your health care provider. Make sure you discuss any questions you have with your health care provider. Document Released: 08/04/2004 Document Revised: 05/27/2016 Document Reviewed: 02/06/2016 Elsevier Interactive Patient Education  2019 [employer].   Otitis Media, Adult  Otitis media means that the middle ear is red and swollen (inflamed) and full of fluid. The condition usually goes away on its own. Follow these instructions at home:  Take over-the-counter and prescription medicines only as told by your doctor.  If you were prescribed an antibiotic medicine, take it as told by your doctor. Do not stop taking the antibiotic even if you start to feel better.  Keep all follow-up visits as told by your doctor. This is important. Contact a doctor if:  You have bleeding from your nose.  There is a lump on your neck.  You are not getting better in 5 days.  You feel worse instead of better. Get help right away if:  You have pain that is not helped with medicine.  You have  swelling, redness, or pain around your ear.  You get a stiff neck.  You cannot move part of your face (paralyzed).  You notice that the bone behind your ear hurts when you touch it.  You get a very bad headache. Summary  Otitis media means that the middle ear is red, swollen, and full of fluid.  This condition usually goes away on its own. In some cases, treatment may be needed.  If you were prescribed an antibiotic medicine, take it as told by your doctor. This information is not intended to replace advice given to you by your health care provider. Make sure you discuss any questions you have with your health care provider. Document Released: 04/25/2008 Document Revised: 11/28/2016 Document Reviewed: 11/28/2016 Elsevier Interactive Patient Education  2019 [employer].

## 2019-01-08 NOTE — Progress Notes (Signed)
Acute Office Visit  Subjective:    Patient ID: Destiny Atkinson, female    DOB: Jan 09, 1976, 43 y.o.   MRN: [id number]  Chief Complaint  Patient presents with  . Sore Throat    for 3 days  . Otalgia    HPI Patient is in today for sore throat and bilateral otalgia that started 2 days ago.  She has not been around anyone that has had confirmed influenza.  She endorses chills, fatigue, very sore throat, nausea, some wheezing and shortness of breath (does have asthma), intermittent headache.  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  No vomiting, chest pain, nasal congestion, or palpitations.  Using her Symbicort daily.  Taking Xyzal daily.  Past Medical History:  Diagnosis Date  . Allergic rhinitis   . Anxiety   . Asthma   . B12 deficiency   . Decreased libido   . Hypertension   . Irregular menstrual bleeding   . Migraines    without aura  . Mild depression (HCC)   . Mobitz type 1 second degree atrioventricular block   . Peripheral neuropathy   . Tobacco use disorder     Past Surgical History:  Procedure Laterality Date  . COLONOSCOPY     in 8th grade, not as adult  . ESOPHAGOGASTRODUODENOSCOPY     age 57  . NASAL SINUS SURGERY  01/21/2010   septoplasty/ sinus surgery  . WISDOM TOOTH EXTRACTION  age 83    Family History  Problem Relation Age of Onset  . COPD Mother   . Fibromyalgia Mother   . Hypertension Mother   . Stroke Mother   . Asthma Mother   . Arthritis Mother   . Depression Mother   . Heart disease Mother   . Crohn's disease Mother   . Diabetes Father   . Epilepsy Brother   . Stomach cancer Paternal Grandmother 18  . Ovarian cancer Other 45  . Ovarian cancer Other 50   Social History   Socioeconomic History  . Marital status: Married    Spouse name: Not on file  . Number of children: 2  . Years of education: 58  . Highest education level: Some college, no degree  Occupational  History  . Occupation: Associate Professor: [employer]  Social Needs  . Financial resource strain: Hard  . Food insecurity:    Worry: Never true    Inability: Never true  . Transportation needs:    Medical: No    Non-medical: No  Tobacco Use  . Smoking status: Current Every Day Smoker    Packs/day: 0.50    Years: 27.00    Pack years: 13.50    Types: Cigarettes, E-cigarettes    Start date: 06/29/1995  . Smokeless tobacco: Never Used  Substance and Sexual Activity  . Alcohol use: Yes    Alcohol/week: 0.0 standard drinks    Comment: rarely  . Drug use: No  . Sexual activity: Yes    Partners: Male    Birth control/protection: I.U.D.  Lifestyle  . Physical activity:    Days per week: 3 days    Minutes per session: 20 min  . Stress: Not at all  Relationships  . Social connections:    Talks on phone: Three times a week    Gets together: Not on file    Attends religious service: Never  Active member of club or organization: No    Attends meetings of clubs or organizations: Never    Relationship status: Married  . Intimate partner violence:    Fear of current or ex partner: No    Emotionally abused: No    Physically abused: No    Forced sexual activity: No  Other Topics Concern  . 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 Dog Epithelium     Anaphylaxis with Rabbits.  Dogs, cats, birds cause swelling, extreme itching.  . Dust Mite Extract   . Pollen Extract   . Soap   . Tape   . Tree Extract     Review of Systems  All other systems reviewed and are negative.      Objective:    Physical Exam  Constitutional: She is oriented to person, place, and time. She appears well-developed and well-nourished. No distress.  HENT:  Head: Normocephalic and atraumatic.  Right Ear: External ear and  ear canal normal. A middle ear effusion is present.  Left Ear: External ear and ear canal normal. No drainage. Tympanic membrane is injected. A middle ear effusion is present.  Nose: Nose normal. No mucosal edema or rhinorrhea. Right sinus exhibits no maxillary sinus tenderness and no frontal sinus tenderness. Left sinus exhibits no maxillary sinus tenderness and no frontal sinus tenderness.  Mouth/Throat: Uvula is midline and mucous membranes are normal. Posterior oropharyngeal erythema present. No oropharyngeal exudate, posterior oropharyngeal edema or tonsillar abscesses.  Dulled landmarks  Eyes: Pupils are equal, round, and reactive to light. Conjunctivae and EOM are normal. No scleral icterus.  Neck: Normal range of motion and full passive range of motion without pain. Neck supple. No neck rigidity. Normal range of motion present.  Cardiovascular: Normal rate, regular rhythm and normal heart sounds. Exam reveals no friction rub.  No murmur heard. Pulmonary/Chest: Effort normal and breath sounds normal. No respiratory distress. She has no wheezes. She has no rales.  Musculoskeletal: Normal range of motion.        General: No deformity or edema.  Lymphadenopathy:    She has cervical adenopathy (L>R, submandibular).  Neurological: She is alert and oriented to person, place, and time. No cranial nerve deficit.  Skin: Skin is warm and dry. No rash noted. She is not diaphoretic.  Psychiatric: She has a normal mood and affect. Her behavior is normal. Judgment and thought content normal.  Nursing note and vitals reviewed.  BP 124/88 (BP Location: Right Arm, Patient Position: Sitting, Cuff Size: Large)   Pulse (!) 102   Temp 98.8 F (37.1 C) (Oral)   Resp 16   Ht 5\' 7"  (1.702 m)   Wt 192 lb 3.2 oz (87.2 kg)   SpO2 95%   BMI 30.10 kg/m  Wt Readings from Last 3 Encounters:  01/08/19 192 lb 3.2 oz (87.2 kg)  11/12/18 192 lb 14.4 oz (87.5 kg)  06/14/18 192 lb (87.1 kg)   There are no  preventive care reminders to display for this patient.  There are no preventive care reminders to display for this patient.  No results found for: TSH Lab Results  Component Value Date   WBC 8.0 11/10/2017   HGB 14.6 11/10/2017   HCT 42.2 11/10/2017   MCV 93.0 11/10/2017   PLT 304 11/10/2017  Lab Results  Component Value Date   NA 139 05/11/2018   K 4.1 05/11/2018   CO2 26 05/11/2018   GLUCOSE 90 05/11/2018   BUN 9 05/11/2018   CREATININE 0.77 05/11/2018   BILITOT 0.6 05/11/2018   ALKPHOS 70 12/28/2016   AST 17 05/11/2018   ALT 12 05/11/2018   PROT 6.9 05/11/2018   ALBUMIN 4.6 12/28/2016   CALCIUM 9.5 05/11/2018   Lab Results  Component Value Date   CHOL 199 05/11/2018   Lab Results  Component Value Date   HDL 43 (L) 05/11/2018   Lab Results  Component Value Date   LDLCALC 123 (H) 05/11/2018   Lab Results  Component Value Date   TRIG 209 (H) 05/11/2018   Lab Results  Component Value Date   CHOLHDL 4.6 05/11/2018   Lab Results  Component Value Date   HGBA1C 5.8 (H) 05/11/2018      Assessment & Plan:   Problem List Items Addressed This Visit      Respiratory   Asthma, moderate persistent   Relevant Medications   amoxicillin-clavulanate (AUGMENTIN) 875-125 MG tablet   predniSONE (DELTASONE) 20 MG tablet    Other Visit Diagnoses    Non-recurrent acute suppurative otitis media of left ear without spontaneous rupture of tympanic membrane    -  Primary   Relevant Medications   amoxicillin-clavulanate (AUGMENTIN) 875-125 MG tablet   fluconazole (DIFLUCAN) 150 MG tablet   predniSONE (DELTASONE) 20 MG tablet   Sore throat       Relevant Medications   magic mouthwash w/lidocaine SOLN   Other Relevant Orders   POCT rapid strep A (Completed)   Pharyngitis, unspecified etiology       Relevant Medications   amoxicillin-clavulanate (AUGMENTIN) 875-125 MG tablet   predniSONE (DELTASONE) 20 MG tablet   Antibiotic-induced yeast infection       Relevant  Medications   fluconazole (DIFLUCAN) 150 MG tablet   magic mouthwash w/lidocaine SOLN      Meds ordered this encounter  Medications  . amoxicillin-clavulanate (AUGMENTIN) 875-125 MG tablet    Sig: Take 1 tablet by mouth 2 (two) times daily for 10 days.    Dispense:  20 tablet    Refill:  0    Order Specific Question:   Supervising Provider    Answer:   Alba CorySOWLES, KRICHNA [[id number]]  . fluconazole (DIFLUCAN) 150 MG tablet    Sig: Take 1 tablet (150 mg total) by mouth once for 1 dose.    Dispense:  1 tablet    Refill:  0    Order Specific Question:   Supervising Provider    Answer:   Alba CorySOWLES, KRICHNA [[id number]]  . magic mouthwash w/lidocaine SOLN    Sig: Take 5 mLs by mouth 4 (four) times daily as needed for mouth pain.    Dispense:  100 mL    Refill:  0    1/4 benadryl, 1/4 maalox. 1/4 lidocaine, 1/4 hydrocortisone (or alternative steroid if not available)    Order Specific Question:   Supervising Provider    Answer:   Alba CorySOWLES, KRICHNA [[id number]]  . predniSONE (DELTASONE) 20 MG tablet    Sig: Take 1 tablet (20 mg total) by mouth 2 (two) times daily with a meal for 5 days. Take second dose prior to 2pm.    Dispense:  10 tablet    Refill:  0    Order Specific Question:   Supervising Provider    Answer:   Alba CorySOWLES, KRICHNA [[id number]]  Hubbard Hartshorn, FNP

## 2019-01-15 ENCOUNTER — Encounter: Payer: Self-pay | Admitting: Nurse Practitioner

## 2019-01-15 ENCOUNTER — Ambulatory Visit (INDEPENDENT_AMBULATORY_CARE_PROVIDER_SITE_OTHER): Payer: BLUE CROSS/BLUE SHIELD | Admitting: Nurse Practitioner

## 2019-01-15 VITALS — BP 116/72 | HR 88 | Temp 98.3°F | Resp 18 | Ht 67.0 in | Wt 195.5 lb

## 2019-01-15 DIAGNOSIS — J40 Bronchitis, not specified as acute or chronic: Secondary | ICD-10-CM | POA: Diagnosis not present

## 2019-01-15 DIAGNOSIS — R0981 Nasal congestion: Secondary | ICD-10-CM | POA: Diagnosis not present

## 2019-01-15 MED ORDER — DM-GUAIFENESIN ER 30-600 MG PO TB12: 1.0000 | ORAL_TABLET | Freq: Two times a day (BID) | ORAL | 0 refills | Status: DC

## 2019-01-15 MED ORDER — HYDROCOD POLST-CPM POLST ER 10-8 MG/5ML PO SUER: 5.0000 mL | Freq: Two times a day (BID) | ORAL | 0 refills | Status: DC

## 2019-01-15 MED ORDER — FLUTICASONE PROPIONATE 50 MCG/ACT NA SUSP: 2.0000 | Freq: Every day | NASAL | 6 refills | Status: DC

## 2019-01-15 NOTE — Progress Notes (Signed)
Name: Destiny Atkinson   MRN: [id number]    DOB: 1976/05/29   Date:01/15/2019       Progress Note  Subjective  Chief Complaint  Chief Complaint  Patient presents with  . Follow-up    patient has not improved and has gotten worse    HPI  Patient was seen on 2/18 and diagnoses with ear infection and pharyngitis- was started on Augmentin, prednisone, magic mouth wash then. Took medication as prescribed. States started to feel better Thursday- Saturday felt better but then progressively got worse. States cough has gotten bad yesterday, left ear pain worsened and sore throat has worsened as well. Has some temporary relief with magic mouth wash. Is still taking Augmentin.  Has severe asthma and allergies, well controlled recently. Tessalon perls do not work for her.   Patient Active Problem List   Diagnosis Date Noted  . Pre-diabetes 11/17/2017  . Vitamin D deficiency 12/28/2016  . Mild major depression (HCC) 11/17/2015  . GAD (generalized anxiety disorder) 05/06/2015  . Asthma, moderate persistent 05/06/2015  . Mobitz type I incomplete atrioventricular block 05/06/2015  . B12 deficiency 05/06/2015  . Essential (primary) hypertension 05/06/2015  . Irregular bleeding 05/06/2015  . Migraine without aura and responsive to treatment 05/06/2015  . Lack of erotic interest 05/06/2015  . BMI 31.0-31.9,adult 05/06/2015  . Peripheral neuropathy 05/06/2015  . Perennial allergic rhinitis 05/06/2015  . Restless leg 05/06/2015  . Tobacco abuse 05/06/2015    Past Medical History:  Diagnosis Date  . Allergic rhinitis   . Anxiety   . Asthma   . B12 deficiency   . Decreased libido   . Hypertension   . Irregular menstrual bleeding   . Migraines    without aura  . Mild depression (HCC)   . Mobitz type 1 second degree atrioventricular block   . Peripheral neuropathy   . Tobacco use disorder     Past Surgical History:  Procedure Laterality Date  . COLONOSCOPY     in 8th grade, not as adult   . ESOPHAGOGASTRODUODENOSCOPY     age 47  . NASAL SINUS SURGERY  01/21/2010   septoplasty/ sinus surgery  . WISDOM TOOTH EXTRACTION  age 43    Social History   Tobacco Use  . Smoking status: Current Every Day Smoker    Packs/day: 0.50    Years: 27.00    Pack years: 13.50    Types: Cigarettes, E-cigarettes    Start date: 06/29/1995  . Smokeless tobacco: Never Used  Substance Use Topics  . Alcohol use: Yes    Alcohol/week: 0.0 standard drinks    Comment: rarely      Allergies  Allergen Reactions  . Dog Epithelium     Anaphylaxis with Rabbits.  Dogs, cats, birds cause swelling, extreme itching.  . Dust Mite Extract   . Pollen Extract   . Soap   . Tape   . Tree Extract     ROS  No other specific complaints in a complete review of systems (except as listed in HPI above).  Objective  Vitals:   01/15/19 1542  BP: 116/72  Pulse: 88  Resp: 18  Temp: 98.3 F (36.8 C)  TempSrc: Oral  SpO2: 97%  Weight: 195 lb 8 oz (88.7 kg)  Height: 5\' 7"  (1.702 m)    Body mass index is 30.62 kg/m.  Nursing Note and Vital Signs reviewed.  Physical Exam HENT:     Head: Normocephalic and atraumatic.     Right Ear:  Hearing, tympanic membrane, ear canal and external ear normal.     Left Ear: Hearing, tympanic membrane, ear canal and external ear normal.     Nose: Congestion present.     Right Sinus: No maxillary sinus tenderness or frontal sinus tenderness.     Left Sinus: No maxillary sinus tenderness or frontal sinus tenderness.     Mouth/Throat:     Mouth: Mucous membranes are moist.     Pharynx: Uvula midline. Posterior oropharyngeal erythema present. No pharyngeal swelling or oropharyngeal exudate.  Eyes:     General:        Right eye: No discharge.        Left eye: No discharge.     Conjunctiva/sclera: Conjunctivae normal.  Neck:     Musculoskeletal: Normal range of motion.  Cardiovascular:     Rate and Rhythm: Normal rate.  Pulmonary:     Effort: Pulmonary  effort is normal.     Breath sounds: Normal breath sounds.  Lymphadenopathy:     Cervical: No cervical adenopathy.  Skin:    General: Skin is warm and dry.     Findings: No rash.  Neurological:     Mental Status: She is alert.  Psychiatric:        Judgment: Judgment normal.      No results found for this or any previous visit (from the past 48 hour(s)).  Assessment & Plan 1. Bronchitis - Take double dose of xyzal for one week, take flonase one spray in each nostril twice a day, neti- pot and saline rinse. - Take musinex-DM twice a day - Rest, drink lots of water, good nutrition with plenty of vitamin C and A - If you develop worsening shortness of breath, fever/chill not controlled with tylenol and ibuprofen or severe facial pain- please let us know.  - dextromethorphan-guaiFENesin (MUCINEX DM) 30-600 MG 12hr tablet; Take 1-2 tablets by mouth 2 (two) times daily.  Dispense: 30 tablet; Refill: 0 - chlorpheniramine-HYDROcodone (TUSSIONEX PENNKINETIC ER) 10-8 MG/5ML SUER; Take 5 mLs by mouth 2 (two) times daily.  Dispense: 140 mL; Refill: 0  2. Nasal congestion - fluticasone (FLONASE) 50 MCG/ACT nasal spray; Place 2 sprays into both nostrils daily.  Dispense: 16 g; Refill: 6    -Red flags and when to present for emergency care or RTC including fever >101.24F, chest pain, shortness of breath, new/worsening/un-resolving symptoms, reviewed with patient at time of visit. Follow up and care instructions discussed and provided in AVS. -Reviewed Health Maintenance:

## 2019-01-15 NOTE — Patient Instructions (Addendum)
-   Take double dose of xyzal for one week, take flonase one spray in each nostril twice a day, neti- pot and saline rinse. - Take musinex-DM twice a day - Rest, drink lots of water, good nutrition with plenty of vitamin C and A - If you develop worsening shortness of breath, fever/chill not controlled with tylenol and ibuprofen or severe facial pain- please let us know.    Acute Bronchitis, Adult Acute bronchitis is when air tubes (bronchi) in the lungs suddenly get swollen. The condition can make it hard to breathe. It can also cause these symptoms:  A cough.  Coughing up clear, yellow, or green mucus.  Wheezing.  Chest congestion.  Shortness of breath.  A fever.  Body aches.  Chills.  A sore throat. Follow these instructions at home:  Medicines  Take over-the-counter and prescription medicines only as told by your doctor.  If you were prescribed an antibiotic medicine, take it as told by your doctor. Do not stop taking the antibiotic even if you start to feel better. General instructions  Rest.  Drink enough fluids to keep your pee (urine) pale yellow.  Avoid smoking and secondhand smoke. If you smoke and you need help quitting, ask your doctor. Quitting will help your lungs heal faster.  Use an inhaler, cool mist vaporizer, or humidifier as told by your doctor.  Keep all follow-up visits as told by your doctor. This is important. How is this prevented? To lower your risk of getting this condition again:  Wash your hands often with soap and water. If you cannot use soap and water, use hand sanitizer.  Avoid contact with people who have cold symptoms.  Try not to touch your hands to your mouth, nose, or eyes.  Make sure to get the flu shot every year. Contact a doctor if:  Your symptoms do not get better in 2 weeks. Get help right away if:  You cough up blood.  You have chest pain.  You have very bad shortness of breath.  You become dehydrated.  You  faint (pass out) or keep feeling like you are going to pass out.  You keep throwing up (vomiting).  You have a very bad headache.  Your fever or chills gets worse. This information is not intended to replace advice given to you by your health care provider. Make sure you discuss any questions you have with your health care provider. Document Released: 04/25/2008 Document Revised: 06/21/2017 Document Reviewed: 04/27/2016 Elsevier Interactive Patient Education  2019 [employer].

## 2019-01-26 ENCOUNTER — Other Ambulatory Visit: Payer: Self-pay | Admitting: Family Medicine

## 2019-01-26 DIAGNOSIS — J4541 Moderate persistent asthma with (acute) exacerbation: Secondary | ICD-10-CM

## 2019-01-26 DIAGNOSIS — J029 Acute pharyngitis, unspecified: Secondary | ICD-10-CM

## 2019-01-26 DIAGNOSIS — H66002 Acute suppurative otitis media without spontaneous rupture of ear drum, left ear: Secondary | ICD-10-CM

## 2019-01-28 ENCOUNTER — Ambulatory Visit
Admission: RE | Admit: 2019-01-28 | Discharge: 2019-01-28 | Disposition: A | Discharge status: Home / Self Care | Payer: BLUE CROSS/BLUE SHIELD | Attending: Nurse Practitioner | Admitting: Nurse Practitioner

## 2019-01-28 ENCOUNTER — Ambulatory Visit: Payer: Self-pay | Admitting: *Deleted

## 2019-01-28 ENCOUNTER — Ambulatory Visit
Admission: RE | Admit: 2019-01-28 | Discharge: 2019-01-28 | Disposition: A | Discharge status: Home / Self Care | Payer: BLUE CROSS/BLUE SHIELD | Source: Ambulatory Visit | Attending: Nurse Practitioner | Admitting: Nurse Practitioner

## 2019-01-28 ENCOUNTER — Encounter: Payer: Self-pay | Admitting: Nurse Practitioner

## 2019-01-28 ENCOUNTER — Ambulatory Visit (INDEPENDENT_AMBULATORY_CARE_PROVIDER_SITE_OTHER): Payer: BLUE CROSS/BLUE SHIELD | Admitting: Nurse Practitioner

## 2019-01-28 ENCOUNTER — Other Ambulatory Visit: Payer: Self-pay

## 2019-01-28 VITALS — BP 122/74 | HR 99 | Temp 98.5°F | Resp 14 | Ht 67.0 in | Wt 196.2 lb

## 2019-01-28 DIAGNOSIS — J40 Bronchitis, not specified as acute or chronic: Secondary | ICD-10-CM

## 2019-01-28 DIAGNOSIS — R059 Cough, unspecified: Secondary | ICD-10-CM

## 2019-01-28 DIAGNOSIS — R05 Cough: Secondary | ICD-10-CM | POA: Insufficient documentation

## 2019-01-28 DIAGNOSIS — R0602 Shortness of breath: Secondary | ICD-10-CM | POA: Diagnosis not present

## 2019-01-28 DIAGNOSIS — F17210 Nicotine dependence, cigarettes, uncomplicated: Secondary | ICD-10-CM

## 2019-01-28 DIAGNOSIS — Z716 Tobacco abuse counseling: Secondary | ICD-10-CM

## 2019-01-28 MED ORDER — HYDROCOD POLST-CPM POLST ER 10-8 MG/5ML PO SUER: 5.0000 mL | Freq: Two times a day (BID) | ORAL | 0 refills | Status: DC

## 2019-01-28 MED ORDER — VARENICLINE TARTRATE 1 MG PO TABS: 1.0000 mg | ORAL_TABLET | Freq: Two times a day (BID) | ORAL | 1 refills | Status: DC

## 2019-01-28 MED ORDER — VARENICLINE TARTRATE 0.5 MG X 11 & 1 MG X 42 PO MISC: ORAL | 0 refills | Status: DC

## 2019-01-28 MED ORDER — PREDNISONE 10 MG (48) PO TBPK: ORAL_TABLET | ORAL | 0 refills | Status: DC

## 2019-01-28 NOTE — Telephone Encounter (Signed)
Please advise 

## 2019-01-28 NOTE — Progress Notes (Signed)
Name: Destiny Atkinson   MRN: [id number]    DOB: July 05, 1976   Date:01/28/2019       Progress Note  Subjective  Chief Complaint  Chief Complaint  Patient presents with  . Cough    not better, blood in cough    HPI  Patient has had a little over 3 weeks of constant cough, shortness of breath. Initially had ear pain as well and was treated with prednisone and augmentin- ear pain has resolved. States taking hycodan at night to help with cough and mucinex DM in the day time. States cough has progressed has had blood tinged cough the last three days, had subjective fever and chills the past week. States shortness of breath is the same as baseline. Endorses chest pain when she coughs. Sore throat, ear pain, and nasal congestion have all resolved. Denies nausea, vomiting.   Patient Active Problem List   Diagnosis Date Noted  . Pre-diabetes 11/17/2017  . Vitamin D deficiency 12/28/2016  . Mild major depression (HCC) 11/17/2015  . GAD (generalized anxiety disorder) 05/06/2015  . Asthma, moderate persistent 05/06/2015  . Mobitz type I incomplete atrioventricular block 05/06/2015  . B12 deficiency 05/06/2015  . Essential (primary) hypertension 05/06/2015  . Irregular bleeding 05/06/2015  . Migraine without aura and responsive to treatment 05/06/2015  . Lack of erotic interest 05/06/2015  . BMI 31.0-31.9,adult 05/06/2015  . Peripheral neuropathy 05/06/2015  . Perennial allergic rhinitis 05/06/2015  . Restless leg 05/06/2015  . Tobacco abuse 05/06/2015    Past Medical History:  Diagnosis Date  . Allergic rhinitis   . Anxiety   . Asthma   . B12 deficiency   . Decreased libido   . Hypertension   . Irregular menstrual bleeding   . Migraines    without aura  . Mild depression (HCC)   . Mobitz type 1 second degree atrioventricular block   . Peripheral neuropathy   . Tobacco use disorder     Past Surgical History:  Procedure Laterality Date  . COLONOSCOPY     in 8th grade, not as  adult  . ESOPHAGOGASTRODUODENOSCOPY     age 23  . NASAL SINUS SURGERY  01/21/2010   septoplasty/ sinus surgery  . WISDOM TOOTH EXTRACTION  age 9    Social History   Tobacco Use  . Smoking status: Current Every Day Smoker    Packs/day: 0.50    Years: 27.00    Pack years: 13.50    Types: Cigarettes, E-cigarettes    Start date: 06/29/1995  . Smokeless tobacco: Never Used  Substance Use Topics  . Alcohol use: Yes    Alcohol/week: 0.0 standard drinks    Comment: rarely      Allergies  Allergen Reactions  . Dog Epithelium     Anaphylaxis with Rabbits.  Dogs, cats, birds cause swelling, extreme itching.  . Dust Mite Extract   . Pollen Extract   . Soap   . Tape   . Tree Extract     ROS   No other specific complaints in a complete review of systems (except as listed in HPI above).  Objective  Vitals:   01/28/19 1416  BP: 122/74  Pulse: 99  Resp: 14  Temp: 98.5 F (36.9 C)  TempSrc: Oral  SpO2: 98%  Weight: 196 lb 3.2 oz (89 kg)  Height: 5\' 7"  (1.702 m)    Body mass index is 30.73 kg/m.  Nursing Note and Vital Signs reviewed.  Physical Exam Vitals signs reviewed.  Constitutional:  Appearance: She is well-developed.  HENT:     Head: Normocephalic and atraumatic.  Neck:     Musculoskeletal: Normal range of motion and neck supple.     Vascular: No carotid bruit.  Cardiovascular:     Heart sounds: Normal heart sounds.  Pulmonary:     Effort: Pulmonary effort is normal.     Breath sounds: Examination of the right-upper field reveals decreased breath sounds. Examination of the left-upper field reveals decreased breath sounds. Examination of the right-lower field reveals decreased breath sounds. Examination of the left-lower field reveals decreased breath sounds. Decreased breath sounds present.     Comments: Coughing throughout assessment  Chest:     Chest wall: Tenderness present.  Abdominal:     General: Bowel sounds are normal.     Palpations:  Abdomen is soft.     Tenderness: There is no abdominal tenderness.  Musculoskeletal: Normal range of motion.  Skin:    General: Skin is warm and dry.     Capillary Refill: Capillary refill takes less than 2 seconds.  Neurological:     Mental Status: She is alert and oriented to person, place, and time.     GCS: GCS eye subscore is 4. GCS verbal subscore is 5. GCS motor subscore is 6.     Sensory: No sensory deficit.  Psychiatric:        Speech: Speech normal.        Behavior: Behavior normal.        Thought Content: Thought content normal.        Judgment: Judgment normal.      No results found for this or any previous visit (from the past 48 hour(s)).  Assessment & Plan  1. Cough Follow up with pulm if unimproved in the next few days  - DG Chest 2 View; Future - chlorpheniramine-HYDROcodone (TUSSIONEX PENNKINETIC ER) 10-8 MG/5ML SUER; Take 5 mLs by mouth 2 (two) times daily.  Dispense: 115 mL; Refill: 0  2. Shortness of breath - DG Chest 2 View; Future - predniSONE (STERAPRED UNI-PAK 48 TAB) 10 MG (48) TBPK tablet; Take as directed  Dispense: 48 tablet; Refill: 0  3. Bronchitis - chlorpheniramine-HYDROcodone (TUSSIONEX PENNKINETIC ER) 10-8 MG/5ML SUER; Take 5 mLs by mouth 2 (two) times daily.  Dispense: 115 mL; Refill: 0 - predniSONE (STERAPRED UNI-PAK 48 TAB) 10 MG (48) TBPK tablet; Take as directed  Dispense: 48 tablet; Refill: 0  4. Cigarette nicotine dependence without complication - varenicline (CHANTIX STARTING MONTH PAK) 0.5 MG X 11 & 1 MG X 42 tablet; Take one 0.5 mg tablet by mouth once daily for 3 days, then increase to one 0.5 mg tablet twice daily for 4 days, then increase to one 1 mg tablet twice daily.  Dispense: 53 tablet; Refill: 0 - varenicline (CHANTIX CONTINUING MONTH PAK) 1 MG tablet; Take 1 tablet (1 mg total) by mouth 2 (two) times daily.  Dispense: 60 tablet; Refill: 1  5. Encounter for smoking cessation counseling Discussed greater than 3 minutes    -Red flags and when to present for emergency care or RTC including fever >101.23F, chest pain, shortness of breath, new/worsening/un-resolving symptoms,  reviewed with patient at time of visit. Follow up and care instructions discussed and provided in AVS.

## 2019-01-28 NOTE — Patient Instructions (Addendum)
-Please go across the street to get a chest x-ray.  - Please take cough medicines as prescribed, add on plain mucinex if tolerated 600-1200mg  twice a day. Start taking prednisone taper.  - Start taking chantix starter pack and pick your quit date and toss all your cigarettes. It is important to plan some when quitting there are some helpful free apps you can use: QuitNow! Or MyQuit Coach and a bunch of others.   Please call your pulmonologist to make a follow-up appointment for your cough if not improving in the next few days.  If you develop severe chest pain or worsening shortness of breath please get immediate medical attention   Varenicline oral tablets What is this medicine? VARENICLINE (var EN i kleen) is used to help people quit smoking. It is used with a patient support program recommended by your physician. This medicine may be used for other purposes; ask your health care provider or pharmacist if you have questions. COMMON BRAND NAME(S): Chantix What should I tell my health care provider before I take this medicine? They need to know if you have any of these conditions: -heart disease -if you often drink alcohol -kidney disease -mental illness -on hemodialysis -seizures -history of stroke -suicidal thoughts, plans, or attempt; a previous suicide attempt by you or a family member -an unusual or allergic reaction to varenicline, other medicines, foods, dyes, or preservatives -pregnant or trying to get pregnant -breast-feeding How should I use this medicine? Take this medicine by mouth after eating. Take with a full glass of water. Follow the directions on the prescription label. Take your doses at regular intervals. Do not take your medicine more often than directed. There are 3 ways you can use this medicine to help you quit smoking; talk to your health care professional to decide which plan is right for you: 1) you can choose a quit date and start this medicine 1 week before  the quit date, or, 2) you can start taking this medicine before you choose a quit date, and then pick a quit date between day 8 and 35 days of treatment, or, 3) if you are not sure that you are able or willing to quit smoking right away, start taking this medicine and slowly decrease the amount you smoke as directed by your health care professional with the goal of being cigarette-free by week 12 of treatment. Stick to your plan; ask about support groups or other ways to help you remain cigarette-free. If you are motivated to quit smoking and did not succeed during a previous attempt with this medicine for reasons other than side effects, or if you returned to smoking after this treatment, speak with your health care professional about whether another course of this medicine may be right for you. A special MedGuide will be given to you by the pharmacist with each prescription and refill. Be sure to read this information carefully each time. Talk to your pediatrician regarding the use of this medicine in children. This medicine is not approved for use in children. Overdosage: If you think you have taken too much of this medicine contact a poison control center or emergency room at once. NOTE: This medicine is only for you. Do not share this medicine with others. What if I miss a dose? If you miss a dose, take it as soon as you can. If it is almost time for your next dose, take only that dose. Do not take double or extra doses. What may interact with  this medicine? -alcohol -insulin -other medicines used to help people quit smoking -theophylline -warfarin This list may not describe all possible interactions. Give your health care provider a list of all the medicines, herbs, non-prescription drugs, or dietary supplements you use. Also tell them if you smoke, drink alcohol, or use illegal drugs. Some items may interact with your medicine. What should I watch for while using this medicine? It is okay if  you do not succeed at your attempt to quit and have a cigarette. You can still continue your quit attempt and keep using this medicine as directed. Just throw away your cigarettes and get back to your quit plan. Talk to your health care provider before using other treatments to quit smoking. Using this medicine with other treatments to quit smoking may increase the risk for side effects compared to using a treatment alone. You may get drowsy or dizzy. Do not drive, use machinery, or do anything that needs mental alertness until you know how this medicine affects you. Do not stand or sit up quickly, especially if you are an older patient. This reduces the risk of dizzy or fainting spells. Decrease the number of alcoholic beverages that you drink during treatment with this medicine until you know if this medicine affects your ability to tolerate alcohol. Some people have experienced increased drunkenness (intoxication), unusual or sometimes aggressive behavior, or no memory of things that have happened (amnesia) during treatment with this medicine. Sleepwalking can happen during treatment with this medicine, and can sometimes lead to behavior that is harmful to you, other people, or property. Stop taking this medicine and tell your doctor if you start sleepwalking or have other unusual sleep-related activity. After taking this medicine, you may get up out of bed and do an activity that you do not know you are doing. The next morning, you may have no memory of this. Activities include driving a car ("sleep-driving"), making and eating food, talking on the phone, sexual activity, and sleep-walking. Serious injuries have occurred. Stop the medicine and call your doctor right away if you find out you have done any of these activities. Do not take this medicine if you have used alcohol that evening. Do not take it if you have taken another medicine for sleep. The risk of doing these sleep-related activities is  higher. Patients and their families should watch out for new or worsening depression or thoughts of suicide. Also watch out for sudden changes in feelings such as feeling anxious, agitated, panicky, irritable, hostile, aggressive, impulsive, severely restless, overly excited and hyperactive, or not being able to sleep. If this happens, call your health care professional. If you have diabetes and you quit smoking, the effects of insulin may be increased and you may need to reduce your insulin dose. Check with your doctor or health care professional about how you should adjust your insulin dose. What side effects may I notice from receiving this medicine? Side effects that you should report to your doctor or health care professional as soon as possible: -allergic reactions like skin rash, itching or hives, swelling of the face, lips, tongue, or throat -acting aggressive, being angry or violent, or acting on dangerous impulses -breathing problems -changes in emotions or moods -chest pain or chest tightness -feeling faint or lightheaded, falls -hallucination, loss of contact with reality -mouth sores -redness, blistering, peeling or loosening of the skin, including inside the mouth -signs and symptoms of a stroke like changes in vision; confusion; trouble speaking or understanding; severe  headaches; sudden numbness or weakness of the face, arm or leg; trouble walking; dizziness; loss of balance or coordination -seizures -sleepwalking -suicidal thoughts or other mood changes Side effects that usually do not require medical attention (report to your doctor or health care professional if they continue or are bothersome): -constipation -gas -headache -nausea, vomiting -strange dreams -trouble sleeping This list may not describe all possible side effects. Call your doctor for medical advice about side effects. You may report side effects to FDA at 1-800-FDA-1088. Where should I keep my  medicine? Keep out of the reach of children. Store at room temperature between 15 and 30 degrees C (59 and 86 degrees F). Throw away any unused medicine after the expiration date. NOTE: This sheet is a summary. It may not cover all possible information. If you have questions about this medicine, talk to your doctor, pharmacist, or health care provider.  2019 Elsevier/Gold Standard (2018-05-04 12:48:08)

## 2019-01-28 NOTE — Telephone Encounter (Signed)
Summary: cough   Pt has been seen in office on 02/18 and 02/25. Pt is not getting better and cough is still present. Pt requested medication from pharmacy that was denied. Pt wants to speak with a nurse to find out what she can be doing to help with symptoms if RX has been denied.      Reason for Disposition . SEVERE coughing spells (e.g., whooping sound after coughing, vomiting after coughing)  Answer Assessment - Initial Assessment Questions 1. ONSET: "When did the cough begin?"      3 weeks 2. SEVERITY: "How bad is the cough today?"      Patient coughs when she talks or she get hot. Patient also will cough so hard that she can't catch catch her breath 3. RESPIRATORY DISTRESS: "Describe your breathing."      SOB- using inhaler and nebulizer, patient is using shallow breathing 4. FEVER: "Do you have a fever?" If so, ask: "What is your temperature, how was it measured, and when did it start?"     Patient has not checked it- but she has hot and cold spells 5. HEMOPTYSIS: "Are you coughing up any blood?" If so ask: "How much?" (flecks, streaks, tablespoons, etc.)     Yes- 3 days now 6. TREATMENT: "What have you done so far to treat the cough?" (e.g., meds, fluids, humidifier)     Mucinex- she has stopped it 3 nights, Tussinex- off 2 nights 7. CARDIAC HISTORY: "Do you have any history of heart disease?" (e.g., heart attack, congestive heart failure)      Afib 8. LUNG HISTORY: "Do you have any history of lung disease?"  (e.g., pulmonary embolus, asthma, emphysema)     Asthma, bronchitis history 9. PE RISK FACTORS: "Do you have a history of blood clots?" (or: recent major surgery, recent prolonged travel, bedridden)     no 10. OTHER SYMPTOMS: "Do you have any other symptoms? (e.g., runny nose, wheezing, chest pain)       Wheezing, pain in R rib, headache 11. PREGNANCY: "Is there any chance you are pregnant?" "When was your last menstrual period?"       No- IUD 12. TRAVEL: "Have you  traveled out of the country in the last month?" (e.g., travel history, exposures)       No travel  Protocols used: COUGH - ACUTE NON-PRODUCTIVE-A-AH

## 2019-01-29 NOTE — Telephone Encounter (Signed)
Patient seen in office yesterday and issues all addressed.

## 2019-02-12 ENCOUNTER — Other Ambulatory Visit: Payer: Self-pay | Admitting: Family Medicine

## 2019-02-12 DIAGNOSIS — F411 Generalized anxiety disorder: Secondary | ICD-10-CM

## 2019-02-12 NOTE — Telephone Encounter (Signed)
Refill request for general medication. Alprazolam   Last office visit 11/12/2018  Follow up on 03/01/2019

## 2019-02-13 NOTE — Telephone Encounter (Signed)
Pt has been scheduled.  °

## 2019-02-14 ENCOUNTER — Encounter: Payer: Self-pay | Admitting: Family Medicine

## 2019-02-14 ENCOUNTER — Ambulatory Visit (INDEPENDENT_AMBULATORY_CARE_PROVIDER_SITE_OTHER): Payer: BLUE CROSS/BLUE SHIELD | Admitting: Family Medicine

## 2019-02-14 ENCOUNTER — Other Ambulatory Visit: Payer: Self-pay

## 2019-02-14 DIAGNOSIS — G43009 Migraine without aura, not intractable, without status migrainosus: Secondary | ICD-10-CM

## 2019-02-14 DIAGNOSIS — Z720 Tobacco use: Secondary | ICD-10-CM

## 2019-02-14 DIAGNOSIS — F321 Major depressive disorder, single episode, moderate: Secondary | ICD-10-CM

## 2019-02-14 DIAGNOSIS — J3089 Other allergic rhinitis: Secondary | ICD-10-CM | POA: Diagnosis not present

## 2019-02-14 DIAGNOSIS — J4540 Moderate persistent asthma, uncomplicated: Secondary | ICD-10-CM | POA: Diagnosis not present

## 2019-02-14 DIAGNOSIS — I10 Essential (primary) hypertension: Secondary | ICD-10-CM

## 2019-02-14 DIAGNOSIS — F411 Generalized anxiety disorder: Secondary | ICD-10-CM | POA: Diagnosis not present

## 2019-02-14 DIAGNOSIS — E785 Hyperlipidemia, unspecified: Secondary | ICD-10-CM

## 2019-02-14 MED ORDER — BUDESONIDE-FORMOTEROL FUMARATE 160-4.5 MCG/ACT IN AERO: 2.0000 | INHALATION_SPRAY | Freq: Two times a day (BID) | RESPIRATORY_TRACT | 2 refills | Status: DC

## 2019-02-14 MED ORDER — ALPRAZOLAM 0.5 MG PO TABS: 0.5000 mg | ORAL_TABLET | Freq: Every day | ORAL | 0 refills | Status: DC | PRN

## 2019-02-14 MED ORDER — HYDROXYZINE HCL 25 MG PO TABS: 25.0000 mg | ORAL_TABLET | Freq: Every day | ORAL | 0 refills | Status: DC

## 2019-02-14 MED ORDER — ALBUTEROL SULFATE HFA 108 (90 BASE) MCG/ACT IN AERS: 2.0000 | INHALATION_SPRAY | Freq: Four times a day (QID) | RESPIRATORY_TRACT | 1 refills | Status: DC | PRN

## 2019-02-14 NOTE — Progress Notes (Signed)
Name: Destiny Atkinson   MRN: [id number]    DOB: 10-03-1976   Date:02/14/2019       Progress Note  Subjective  Chief Complaint  Chief Complaint  Patient presents with  . Medication Refill    3 month F/U  . Asthma  . Hypertension    Denies any symptoms-has been running 118/66  . Migraine    Unchanged around one episode every 3 montsh   . Allergic Rhinitis     Horrible with increase pollen-but stable with allergy medication and nasal spray   . RLS    Unchanged     I connected with@ on 02/14/19 at 10:40 AM EDT by a video enabled telemedicine application and verified that I am speaking with the correct person using two identifiers.  I discussed the limitations of evaluation and management by telemedicine and the availability of in person appointments. The patient expressed understanding and agreed to proceed. Pt is at home and I am in office for phone call today which is performed due to COVID19 pandemic.  She is at home I am at work   HPI  Migraine :She has one episode in about every 3 month. Not associated nausea or vomiting, but is associated with photophobia. Migraine is described as a sharp pain over left eye and radiates to left temporal area and sometimes nuchal area.She takesMaxaltprn and states it works within 30 minutes.Unchanged   HTN:we stopped Losartan back in June 2019 because of dizziness and is doing well on HCTZ by itself. No chest pain , occasionally has dizziness   GAD/MajorDepression: she left [employer] and started a new job at [employer] in [place] back in May 2017, she left [employer] March 2018 when she walked away from her job, and was unemployed until 06/2017. 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 She is back on Lexapro 10 mg daily, she still worries about her oldest daughter that has depression, her  husband does not understand depression. She states since change in routine she has been more anxious, she has only one pill of alprazolam left, we will try hydroxyzine at night   RLS/Peripheral Neuropathy: She has a need to move her legs when sitting down, also states that at night her legs moves constantly. She noticed worsening of symptoms since she has been working from and has not been walking as much, she will try to walk daily   AR: she his allergic to cats, but has 3 cats at home, also allergic to dogs and has one of them, she is using nasal steroid and otc allergy medication, she also has seasonal allergic . She has itchy eyes and nose, also her throat . She denies nasal congestion   Asthma: she still smokes,bt is down to 4 cigarettes daily. She states cough has improved but has, she states since she started using Symbicort daily wheezing resolved. She had a flare about one month ago, but doing much better now, using rescue at most 3 times a week when she has some cough and SOB, otherwise doing well.   Dyslipidemia: reviewed labs with patient done 04/2018, still has high triglycerides and low HDL, trying to eat healthier. She is cutting down on fried food, cooking more at home   Metabolic syndrome: labs hgbA1C was 5.8%, monitoring the carbohydrate intake. She was recently  on prednisone taper and gained some weight, but starting to lose it again She denies polyphagia, polydipsia or polyuria.   Patient Active Problem List   Diagnosis Date Noted  . Pre-diabetes 11/17/2017  . Vitamin D deficiency 12/28/2016  . Mild major depression (HCC) 11/17/2015  . GAD (generalized anxiety disorder) 05/06/2015  . Asthma, moderate persistent 05/06/2015  . Mobitz type I incomplete atrioventricular block 05/06/2015  . B12 deficiency 05/06/2015  . Essential (primary) hypertension 05/06/2015  . Irregular bleeding 05/06/2015  . Migraine without aura and responsive to treatment 05/06/2015  . Lack of  erotic interest 05/06/2015  . BMI 31.0-31.9,adult 05/06/2015  . Peripheral neuropathy 05/06/2015  . Perennial allergic rhinitis 05/06/2015  . Restless leg 05/06/2015  . Tobacco abuse 05/06/2015    Past Surgical History:  Procedure Laterality Date  . COLONOSCOPY     in 8th grade, not as adult  . ESOPHAGOGASTRODUODENOSCOPY     age 43  . NASAL SINUS SURGERY  01/21/2010   septoplasty/ sinus surgery  . WISDOM TOOTH EXTRACTION  age 89    Family History  Problem Relation Age of Onset  . COPD Mother   . Fibromyalgia Mother   . Hypertension Mother   . Stroke Mother   . Asthma Mother   . Arthritis Mother   . Depression Mother   . Heart disease Mother   . Crohn's disease Mother   . Diabetes Father   . Epilepsy Brother   . Stomach cancer Paternal Grandmother [id number]  . Ovarian cancer Other 45  . Ovarian cancer Other 50    Social History   Socioeconomic History  . Marital status: Married    Spouse name: Reuel BoomDaniel  . Number of children: 2  . Years of education: 6414  . Highest education level: Some college, no degree  Occupational History  . Occupation: Associate Professorwarehouse manager     Employer: [employer]  Social Needs  . Financial resource strain: Hard  . Food insecurity:    Worry: Never true    Inability: Never true  . Transportation needs:    Medical: No    Non-medical: No  Tobacco Use  . Smoking status: Current Every Day Smoker    Packs/day: 0.25    Years: 24.00    Pack years: 6.00    Types: Cigarettes, E-cigarettes    Start date: 06/29/1995  . Smokeless tobacco: Never Used  . Tobacco comment: Has cut back to 4 cigarette daily with Chantix  Substance and Sexual Activity  . Alcohol use: Yes    Alcohol/week: 0.0 standard drinks    Comment: rarely  . Drug use: No  . Sexual activity: Yes    Partners: Male    Birth control/protection: I.U.D.  Lifestyle  . Physical activity:    Days per week: 3 days    Minutes per session: 20 min  . Stress: Not at all   Relationships  . Social connections:    Talks on phone: Three times a week    Gets together: Once a week    Attends religious service: Never    Active member of club or organization: No    Attends meetings of clubs or organizations: Never    Relationship status: Married  . Intimate partner violence:    Fear of current or ex partner: No    Emotionally abused: No    Physically abused: No    Forced sexual activity: No  Other Topics Concern  . Not on file  Social History Narrative  She is married, has two children   She is now working at [employer] - Pharmacist, community     Current Outpatient Medications:  .  ALPRAZolam (XANAX) 0.5 MG tablet, Take 1 tablet (0.5 mg total) by mouth daily as needed for anxiety., Disp: 30 tablet, Rfl: 0 .  budesonide-formoterol (SYMBICORT) 160-4.5 MCG/ACT inhaler, Inhale 2 puffs into the lungs 2 (two) times daily., Disp: 1 Inhaler, Rfl: 2 .  escitalopram (LEXAPRO) 10 MG tablet, Take 1 tablet (10 mg total) by mouth daily., Disp: 90 tablet, Rfl: 1 .  fluticasone (FLONASE) 50 MCG/ACT nasal spray, Place 2 sprays into both nostrils daily., Disp: 16 g, Rfl: 6 .  hydrochlorothiazide (HYDRODIURIL) 12.5 MG tablet, Take 1 tablet (12.5 mg total) by mouth daily., Disp: 90 tablet, Rfl: 1 .  ipratropium-albuterol (DUONEB) 0.5-2.5 (3) MG/3ML SOLN, Take 3 mLs by nebulization every 6 (six) hours as needed., Disp: 360 mL, Rfl: 1 .  levocetirizine (XYZAL) 5 MG tablet, Take 1 tablet (5 mg total) by mouth daily., Disp: 90 tablet, Rfl: 1 .  Levonorgestrel (LILETTA, 52 MG,) 19.5 MCG/DAY IUD IUD, 1 Intra Uterine Device (1 each total) by Intrauterine route once for 1 dose., Disp: 1 each, Rfl: 0 .  rizatriptan (MAXALT-MLT) 10 MG disintegrating tablet, Take 1 tablet (10 mg total) by mouth as needed for migraine., Disp: 9 tablet, Rfl: 0 .  varenicline (CHANTIX CONTINUING MONTH PAK) 1 MG tablet, Take 1 tablet (1 mg total) by mouth 2 (two) times daily., Disp: 60 tablet,  Rfl: 1 .  Vitamin D, Ergocalciferol, (DRISDOL) 1.25 MG (50000 UT) CAPS capsule, Take 1 capsule (50,000 Units total) by mouth once a week., Disp: 12 capsule, Rfl: 1 .  albuterol (PROVENTIL HFA;VENTOLIN HFA) 108 (90 Base) MCG/ACT inhaler, Inhale 2 puffs into the lungs every 6 (six) hours as needed for wheezing or shortness of breath., Disp: 1 Inhaler, Rfl: 1 .  hydrOXYzine (ATARAX/VISTARIL) 25 MG tablet, Take 1 tablet (25 mg total) by mouth at bedtime., Disp: 90 tablet, Rfl: 0  Allergies  Allergen Reactions  . Dog Epithelium     Anaphylaxis with Rabbits.  Dogs, cats, birds cause swelling, extreme itching.  . Dust Mite Extract   . Pollen Extract   . Soap   . Tape   . Tree Extract     I personally reviewed active problem list, medication list, allergies, family history, social history, health maintenance with the patient/caregiver today.   ROS  Constitutional: Negative for fever or weight change.  Respiratory: Positive for cough and shortness of breath.   Cardiovascular: Negative for chest pain or palpitations.  Gastrointestinal: Negative for abdominal pain, no bowel changes.  Musculoskeletal: Negative for gait problem or joint swelling.  Skin: Negative for rash.  Neurological: positive for intermittent  headache.  No other specific complaints in a complete review of systems (except as listed in HPI above).  Objective  Virtual encounter, vitals not obtained.   Physical Exam  Awake and alert and oriented , in no distress, normal speech pattern. Well groomed and pleasant   PHQ2/9: Depression screen [facility] 2/9 02/14/2019 01/15/2019 01/08/2019 11/12/2018 05/11/2018  Decreased Interest 1 0 0 0 1  Down, Depressed, Hopeless 1 0 PHQ - 2 Score 2 0 Altered sleeping Tired, decreased energy Change in appetite 3 0 0 0 2  Feeling bad or failure about yourself  1 1 0 0  1  Trouble concentrating 1 1 1 1 1   Moving slowly or fidgety/restless 3 0 0 0 0  Suicidal  thoughts 0 0 0 0 0  PHQ-9 Score 16 8 4 4 11   Difficult doing work/chores Somewhat difficult Somewhat difficult Somewhat difficult Somewhat difficult Not difficult at all   PHQ-2/9 Result is positive.    GAD 7 : Generalized Anxiety Score 02/14/2019 01/15/2019 11/12/2018 05/11/2018  Nervous, Anxious, on Edge 3 1 1 2   Control/stop worrying 1 1 1 1   Worry too much - different things 3 1 1 2   Trouble relaxing 1 3 1 2   Restless 3 3 1 2   Easily annoyed or irritable 1 2 1 1   Afraid - awful might happen 2 0 0 0  Total GAD 7 Score 14 11 6 10   Anxiety Difficulty Very difficult Somewhat difficult - Somewhat difficult     Fall Risk: Fall Risk  02/14/2019 01/15/2019 01/08/2019 11/12/2018 05/11/2018  Falls in the past year? 0 0 0 0 No  Number falls in past yr: 0 0 0 0 -  Injury with Fall? 0 0 0 0 -  Follow up - - Falls evaluation completed - -    Assessment & Plan  1. Moderate persistent asthma without complication  - budesonide-formoterol (SYMBICORT) 160-4.5 MCG/ACT inhaler; Inhale 2 puffs into the lungs 2 (two) times daily.  Dispense: 1 Inhaler; Refill: 2 - albuterol (PROVENTIL HFA;VENTOLIN HFA) 108 (90 Base) MCG/ACT inhaler; Inhale 2 puffs into the lungs every 6 (six) hours as needed for wheezing or shortness of breath.  Dispense: 1 Inhaler; Refill: 1  2. GAD (generalized anxiety disorder)  - ALPRAZolam (XANAX) 0.5 MG tablet; Take 1 tablet (0.5 mg total) by mouth daily as needed for anxiety.  Dispense: 30 tablet; Refill: 0 - hydrOXYzine (ATARAX/VISTARIL) 25 MG tablet; Take 1 tablet (25 mg total) by mouth at bedtime.  Dispense: 90 tablet; Refill: 0  3. Perennial allergic rhinitis  Continue medication   4. Moderate major depression (HCC)  Continue Lexapro   5. Migraine without aura and responsive to treatment  Continue maxalt prn   6. Essential hypertension  At goal   7. Dyslipidemia  Continue diet   8. Tobacco abuse  Continue chantix , doing well    I discussed the  assessment and treatment plan with the patient. The patient was provided an opportunity to ask questions and all were answered. The patient agreed with the plan and demonstrated an understanding of the instructions.  The patient was advised to call back or seek an in-person evaluation if the symptoms worsen or if the condition fails to improve as anticipated.  I provided 25  minutes of non-face-to-face time during this encounter.

## 2019-03-01 ENCOUNTER — Ambulatory Visit: Payer: BLUE CROSS/BLUE SHIELD | Admitting: Nurse Practitioner

## 2019-03-05 ENCOUNTER — Other Ambulatory Visit: Payer: Self-pay

## 2019-03-05 DIAGNOSIS — F17210 Nicotine dependence, cigarettes, uncomplicated: Secondary | ICD-10-CM

## 2019-03-05 DIAGNOSIS — R0981 Nasal congestion: Secondary | ICD-10-CM

## 2019-03-05 MED ORDER — FLUTICASONE PROPIONATE 50 MCG/ACT NA SUSP: 2.0000 | Freq: Every day | NASAL | 2 refills | Status: DC

## 2019-03-05 MED ORDER — VARENICLINE TARTRATE 1 MG PO TABS: 1.0000 mg | ORAL_TABLET | Freq: Two times a day (BID) | ORAL | 2 refills | Status: DC

## 2019-03-05 NOTE — Telephone Encounter (Signed)
A request was received from [facility] for a 90- day supply of Flonase nasal cpray & Chantix continuing pack.  Refill request was sent to Sharyon Cable, DNP for approval and submission.

## 2019-03-06 ENCOUNTER — Other Ambulatory Visit: Payer: Self-pay

## 2019-03-06 DIAGNOSIS — J4540 Moderate persistent asthma, uncomplicated: Secondary | ICD-10-CM

## 2019-03-06 DIAGNOSIS — F411 Generalized anxiety disorder: Secondary | ICD-10-CM

## 2019-03-06 DIAGNOSIS — E559 Vitamin D deficiency, unspecified: Secondary | ICD-10-CM

## 2019-03-06 MED ORDER — ESCITALOPRAM OXALATE 10 MG PO TABS: 10.0000 mg | ORAL_TABLET | Freq: Every day | ORAL | 1 refills | Status: DC

## 2019-03-06 MED ORDER — HYDROXYZINE HCL 25 MG PO TABS: 25.0000 mg | ORAL_TABLET | Freq: Every day | ORAL | 1 refills | Status: DC

## 2019-03-06 MED ORDER — VITAMIN D (ERGOCALCIFEROL) 1.25 MG (50000 UNIT) PO CAPS: 50000.0000 [IU] | ORAL_CAPSULE | ORAL | 1 refills | Status: DC

## 2019-03-06 MED ORDER — BUDESONIDE-FORMOTEROL FUMARATE 160-4.5 MCG/ACT IN AERO: 2.0000 | INHALATION_SPRAY | Freq: Two times a day (BID) | RESPIRATORY_TRACT | 0 refills | Status: DC

## 2019-03-06 NOTE — Telephone Encounter (Signed)
Refill request for Hypertension medication:  HCTZ 12.5 mg  Last office visit pertaining to hypertension: 02/14/2019  BP Readings from Last 3 Encounters:  01/28/19 122/74  01/15/19 116/72  01/08/19 124/88    Lab Results  Component Value Date   CREATININE 0.77 05/11/2018   BUN 9 05/11/2018   NA 139 05/11/2018   K 4.1 05/11/2018   CL 105 05/11/2018   CO2 26 05/11/2018   Follow-ups on file. 05/17/2019  Symbicort 80-4.5 is the preferred brand please choose alternative.

## 2019-03-11 ENCOUNTER — Other Ambulatory Visit: Payer: Self-pay

## 2019-03-11 DIAGNOSIS — I10 Essential (primary) hypertension: Secondary | ICD-10-CM

## 2019-03-11 MED ORDER — HYDROCHLOROTHIAZIDE 12.5 MG PO TABS: 12.5000 mg | ORAL_TABLET | Freq: Every day | ORAL | 0 refills | Status: DC

## 2019-03-11 NOTE — Telephone Encounter (Signed)
Refill request for Hypertension medication:  Hydrochlorothiazide 12.5 mg  Last office visit pertaining to hypertension: 02/14/2019  BP Readings from Last 3 Encounters:  01/28/19 122/74  01/15/19 116/72  01/08/19 124/88   Lab Results  Component Value Date   CREATININE 0.77 05/11/2018   BUN 9 05/11/2018   NA 139 05/11/2018   K 4.1 05/11/2018   CL 105 05/11/2018   CO2 26 05/11/2018    Follow-ups on file. 05/17/2019

## 2019-05-06 DIAGNOSIS — Z03818 Encounter for observation for suspected exposure to other biological agents ruled out: Secondary | ICD-10-CM | POA: Diagnosis not present

## 2019-05-17 ENCOUNTER — Ambulatory Visit: Payer: BLUE CROSS/BLUE SHIELD | Admitting: Family Medicine

## 2019-05-19 ENCOUNTER — Other Ambulatory Visit: Payer: Self-pay | Admitting: Family Medicine

## 2019-05-19 DIAGNOSIS — J4540 Moderate persistent asthma, uncomplicated: Secondary | ICD-10-CM

## 2019-05-23 ENCOUNTER — Other Ambulatory Visit: Payer: Self-pay | Admitting: Family Medicine

## 2019-05-23 ENCOUNTER — Other Ambulatory Visit: Payer: Self-pay | Admitting: *Deleted

## 2019-05-23 ENCOUNTER — Ambulatory Visit (INDEPENDENT_AMBULATORY_CARE_PROVIDER_SITE_OTHER): Payer: BC Managed Care – PPO | Admitting: Nurse Practitioner

## 2019-05-23 ENCOUNTER — Encounter: Payer: Self-pay | Admitting: Nurse Practitioner

## 2019-05-23 ENCOUNTER — Other Ambulatory Visit: Payer: BLUE CROSS/BLUE SHIELD

## 2019-05-23 ENCOUNTER — Telehealth: Payer: Self-pay | Admitting: Nurse Practitioner

## 2019-05-23 ENCOUNTER — Telehealth: Payer: Self-pay | Admitting: General Practice

## 2019-05-23 VITALS — HR 90 | Resp 16

## 2019-05-23 DIAGNOSIS — J4541 Moderate persistent asthma with (acute) exacerbation: Secondary | ICD-10-CM | POA: Diagnosis not present

## 2019-05-23 DIAGNOSIS — Z20822 Contact with and (suspected) exposure to covid-19: Secondary | ICD-10-CM

## 2019-05-23 DIAGNOSIS — F411 Generalized anxiety disorder: Secondary | ICD-10-CM

## 2019-05-23 DIAGNOSIS — R197 Diarrhea, unspecified: Secondary | ICD-10-CM

## 2019-05-23 DIAGNOSIS — R6889 Other general symptoms and signs: Secondary | ICD-10-CM

## 2019-05-23 DIAGNOSIS — J029 Acute pharyngitis, unspecified: Secondary | ICD-10-CM | POA: Diagnosis not present

## 2019-05-23 NOTE — Telephone Encounter (Signed)
Pt has been scheduled for covid testing. Pt was referred by: Fredderick Severance, NP

## 2019-05-23 NOTE — Progress Notes (Signed)
Virtual Visit via Video Note  I connected with Destiny Atkinson on 05/23/19 at  9:40 AM EDT by a video enabled telemedicine application and verified that I am speaking with the correct person using two identifiers.   Staff discussed the limitations of evaluation and management by telemedicine and the availability of in person appointments. The patient expressed understanding and agreed to proceed.  Patient location: home  My location: home office Other people present: none HPI  Known exposure to [place]: has had positive cases at work but was told they would specifically contacted if had close exposure- has not been contact.  Symptoms: sore throat, nasal congestion, headache, diarrhea. 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   Sore throat started Tuesday then she started to get some sinus congestion and diarrhea and this morning has headache- frontal and temporal pressure, and sore throat has worsened. Denies worsening baseline shortness of breath, cough, wheezing, fevers.  Not sure  Has been taking allergy medicine and tylenol- with moderate relief. Using warm fluids.  PHQ2/9: Depression screen [facility] 2/9 05/23/2019 02/14/2019 01/15/2019 01/08/2019 11/12/2018  Decreased Interest 0 1 0 0 0  Down, Depressed, Hopeless 0 1 0 1 1  PHQ - 2 Score 0 2 0 1 1  Altered sleeping 0 3 3 1 1   Tired, decreased energy 0 3 3 1 1   Change in appetite 0 3 0 0 0  Feeling bad or failure about yourself  0 1 1 0 0  Trouble concentrating 0 1 1 1 1   Moving slowly or fidgety/restless 0 3 0 0 0  Suicidal thoughts 0 0 0 0 0  PHQ-9 Score 0 16 8 4 4   Difficult doing work/chores Not difficult at all Somewhat difficult Somewhat difficult Somewhat difficult Somewhat difficult    PHQ reviewed.Negative  Patient Active Problem List   Diagnosis Date Noted  . Pre-diabetes 11/17/2017  . Vitamin D deficiency 12/28/2016  . Mild major depression ([place]) 11/17/2015  .  GAD (generalized anxiety disorder) 05/06/2015  . Asthma, moderate persistent 05/06/2015  . Mobitz type I incomplete atrioventricular block 05/06/2015  . B12 deficiency 05/06/2015  . Essential (primary) hypertension 05/06/2015  . Irregular bleeding 05/06/2015  . Migraine without aura and responsive to treatment 05/06/2015  . Lack of erotic interest 05/06/2015  . BMI 31.0-31.9,adult 05/06/2015  . Peripheral neuropathy 05/06/2015  . Perennial allergic rhinitis 05/06/2015  . Restless leg 05/06/2015  . Tobacco abuse 05/06/2015    Past Medical History:  Diagnosis Date  . Allergic rhinitis   . Anxiety   . Asthma   . B12 deficiency   . Decreased libido   . Hypertension   . Irregular menstrual bleeding   . Migraines    without aura  . Mild depression ([place])   . Mobitz type 1 second degree atrioventricular block   . Peripheral neuropathy   . Tobacco use disorder     Past Surgical History:  Procedure Laterality Date  . COLONOSCOPY     in 8th grade, not as adult  . ESOPHAGOGASTRODUODENOSCOPY     age 5  . NASAL SINUS SURGERY  01/21/2010   septoplasty/ sinus surgery  . WISDOM TOOTH EXTRACTION  age 89    Social History   Tobacco Use  . Smoking status: Current Every Day Smoker    Packs/day: 0.25    Years: 24.00    Pack years: 6.00    Types: Cigarettes, E-cigarettes    Start date: 06/29/1995  . Smokeless tobacco:  Never Used  . Tobacco comment: Has cut back to 4 cigarette daily with Chantix  Substance Use Topics  . Alcohol use: Yes    Alcohol/week: 0.0 standard drinks    Comment: rarely     Current Outpatient Medications:  .  albuterol (PROVENTIL HFA;VENTOLIN HFA) 108 (90 Base) MCG/ACT inhaler, Inhale 2 puffs into the lungs every 6 (six) hours as needed for wheezing or shortness of breath., Disp: 1 Inhaler, Rfl: 1 .  ALPRAZolam (XANAX) 0.5 MG tablet, Take 1 tablet (0.5 mg total) by mouth daily as needed for anxiety., Disp: 30 tablet, Rfl: 0 .  budesonide-formoterol  (SYMBICORT) 160-4.5 MCG/ACT inhaler, USE 2 INHALATIONS TWICE A DAY, Disp: 30.6 g, Rfl: 0 .  escitalopram (LEXAPRO) 10 MG tablet, Take 1 tablet (10 mg total) by mouth daily., Disp: 90 tablet, Rfl: 1 .  fluticasone (FLONASE) 50 MCG/ACT nasal spray, Place 2 sprays into both nostrils daily., Disp: 48 g, Rfl: 2 .  hydrochlorothiazide (HYDRODIURIL) 12.5 MG tablet, Take 1 tablet (12.5 mg total) by mouth daily., Disp: 90 tablet, Rfl: 0 .  hydrOXYzine (ATARAX/VISTARIL) 25 MG tablet, Take 1 tablet (25 mg total) by mouth at bedtime., Disp: 90 tablet, Rfl: 1 .  ipratropium-albuterol (DUONEB) 0.5-2.5 (3) MG/3ML SOLN, Take 3 mLs by nebulization every 6 (six) hours as needed., Disp: 360 mL, Rfl: 1 .  levocetirizine (XYZAL) 5 MG tablet, Take 1 tablet (5 mg total) by mouth daily., Disp: 90 tablet, Rfl: 1 .  rizatriptan (MAXALT-MLT) 10 MG disintegrating tablet, Take 1 tablet (10 mg total) by mouth as needed for migraine., Disp: 9 tablet, Rfl: 0 .  Vitamin D, Ergocalciferol, (DRISDOL) 1.25 MG (50000 UT) CAPS capsule, Take 1 capsule (50,000 Units total) by mouth once a week., Disp: 12 capsule, Rfl: 1 .  Levonorgestrel (LILETTA, 52 MG,) 19.5 MCG/DAY IUD IUD, 1 Intra Uterine Device (1 each total) by Intrauterine route once for 1 dose., Disp: 1 each, Rfl: 0 .  varenicline (CHANTIX CONTINUING MONTH PAK) 1 MG tablet, Take 1 tablet (1 mg total) by mouth 2 (two) times daily. (Patient not taking: Reported on 05/23/2019), Disp: 180 tablet, Rfl: 2  Allergies  Allergen Reactions  . Dog Epithelium     Anaphylaxis with Rabbits.  Dogs, cats, birds cause swelling, extreme itching.  . Dust Mite Extract   . Pollen Extract   . Soap   . Tape   . Tree Extract     ROS   No other specific complaints in a complete review of systems (except as listed in HPI above).  Objective  Vitals:   05/23/19 0858  Pulse: 90  Resp: 16    There is no height or weight on file to calculate BMI.  Nursing Note and Vital Signs  reviewed.  Physical Exam  Constitutional: Patient appears well-developed and well-nourished. No distress.  HENT: Head: Normocephalic and atraumatic. Cardiovascular: Normal rate Pulmonary/Chest: Effort normal  Neurological: alert and oriented, speech normal.  Skin: No rash noted. No erythema.  Psychiatric: Patient has a normal mood and affect. behavior is normal. Judgment and thought content normal.    Assessment & Plan  1. Suspected Covid-19 Virus Infection Will order testing, discussed otc treatment, return of work protocol, ER precautions.  - MyChart Temperature FLOWSHEET; Future  2. Sore throat Cephacol, saline gargles, warm fluids  3. Diarrhea, unspecified type Bland foods, loperamide as needed.   4. Moderate persistent asthma with acute exacerbation Controlled.   Follow Up Instructions:    I discussed the assessment and treatment  plan with the patient. The patient was provided an opportunity to ask questions and all were answered. The patient agreed with the plan and demonstrated an understanding of the instructions.   The patient was advised to call back or seek an in-person evaluation if the symptoms worsen or if the condition fails to improve as anticipated.  I provided 13 minutes of non-face-to-face time during this encounter.   Cheryle HorsfallElizabeth E Jaydin Boniface, NP

## 2019-05-23 NOTE — Telephone Encounter (Signed)
Symptoms: sore throat, nasal congestion, headache, diarrhea. Onset: 05/21/2019 Co morbidities: hypertension, asthma, obesity

## 2019-05-23 NOTE — Patient Instructions (Signed)
Alternate between Tylenol 500 to 1000 mg every 8 hours and ibuprofen 400 to 600 mg every 8 hours to keep fevers under control and moderate to severe pain.  Drink warm fluids such as honey lemon teas, complete saline gargles as needed and use cephacol lozenges to help with sore throat.  Drink plenty of fluids and eat bland foods to help with diarrhea.  Can take Loperamide 2 to 4 mg every 8 hours as needed for excessive diarrhea.  Do not take this for more than 3 days as it can then cause constipation.  If you develop worsening shortness of breath fevers uncontrolled by ibuprofen Tylenol please call 911.  Please do not return to work until 10 days after your symptoms started if you have been fever free for 3 days with no antipyretics and your symptoms are improving.  Please self isolate, wear a mask anytime you are around anyone else if necessary and wash your hands frequently.

## 2019-05-25 LAB — NOVEL CORONAVIRUS, NAA: SARS-CoV-2, NAA: NOT DETECTED

## 2019-06-27 ENCOUNTER — Other Ambulatory Visit: Payer: Self-pay | Admitting: Family Medicine

## 2019-06-27 ENCOUNTER — Encounter: Payer: Self-pay | Admitting: Family Medicine

## 2019-06-27 DIAGNOSIS — I10 Essential (primary) hypertension: Secondary | ICD-10-CM

## 2019-06-27 DIAGNOSIS — F411 Generalized anxiety disorder: Secondary | ICD-10-CM

## 2019-06-27 MED ORDER — HYDROCHLOROTHIAZIDE 12.5 MG PO TABS: 12.5000 mg | ORAL_TABLET | Freq: Every day | ORAL | 0 refills | Status: DC

## 2019-07-19 ENCOUNTER — Other Ambulatory Visit: Payer: Self-pay | Admitting: Family Medicine

## 2019-07-19 DIAGNOSIS — F411 Generalized anxiety disorder: Secondary | ICD-10-CM

## 2019-07-19 NOTE — Telephone Encounter (Signed)
Patient called stating she needs a rx refill for ALPRAZolam Duanne Moron) 0.5  Patient states PCP was suppose to call in 5 pills for her.  Patient states pharmacy has no record.   Patient call back [phone]

## 2019-07-21 ENCOUNTER — Other Ambulatory Visit: Payer: Self-pay | Admitting: Family Medicine

## 2019-07-21 DIAGNOSIS — E559 Vitamin D deficiency, unspecified: Secondary | ICD-10-CM

## 2019-07-23 ENCOUNTER — Other Ambulatory Visit: Payer: Self-pay | Admitting: Family Medicine

## 2019-07-23 DIAGNOSIS — F411 Generalized anxiety disorder: Secondary | ICD-10-CM

## 2019-07-23 MED ORDER — ALPRAZOLAM 0.5 MG PO TABS: 0.5000 mg | ORAL_TABLET | Freq: Every day | ORAL | 0 refills | Status: DC | PRN

## 2019-08-02 ENCOUNTER — Other Ambulatory Visit: Payer: Self-pay | Admitting: Family Medicine

## 2019-08-02 DIAGNOSIS — J4540 Moderate persistent asthma, uncomplicated: Secondary | ICD-10-CM

## 2019-08-02 DIAGNOSIS — F411 Generalized anxiety disorder: Secondary | ICD-10-CM

## 2019-08-26 ENCOUNTER — Encounter: Payer: Self-pay | Admitting: Family Medicine

## 2019-08-26 ENCOUNTER — Ambulatory Visit (INDEPENDENT_AMBULATORY_CARE_PROVIDER_SITE_OTHER): Payer: BC Managed Care – PPO | Admitting: Family Medicine

## 2019-08-26 ENCOUNTER — Other Ambulatory Visit: Payer: Self-pay

## 2019-08-26 VITALS — Temp 97.9°F | Wt 199.0 lb

## 2019-08-26 DIAGNOSIS — E559 Vitamin D deficiency, unspecified: Secondary | ICD-10-CM | POA: Diagnosis not present

## 2019-08-26 DIAGNOSIS — E8881 Metabolic syndrome: Secondary | ICD-10-CM

## 2019-08-26 DIAGNOSIS — E785 Hyperlipidemia, unspecified: Secondary | ICD-10-CM

## 2019-08-26 DIAGNOSIS — I10 Essential (primary) hypertension: Secondary | ICD-10-CM

## 2019-08-26 DIAGNOSIS — F411 Generalized anxiety disorder: Secondary | ICD-10-CM | POA: Diagnosis not present

## 2019-08-26 DIAGNOSIS — J4489 Other specified chronic obstructive pulmonary disease: Secondary | ICD-10-CM

## 2019-08-26 DIAGNOSIS — E538 Deficiency of other specified B group vitamins: Secondary | ICD-10-CM | POA: Diagnosis not present

## 2019-08-26 DIAGNOSIS — J449 Chronic obstructive pulmonary disease, unspecified: Secondary | ICD-10-CM

## 2019-08-26 DIAGNOSIS — Z720 Tobacco use: Secondary | ICD-10-CM

## 2019-08-26 DIAGNOSIS — F320 Major depressive disorder, single episode, mild: Secondary | ICD-10-CM

## 2019-08-26 MED ORDER — ALPRAZOLAM 0.5 MG PO TABS: 0.5000 mg | ORAL_TABLET | Freq: Every day | ORAL | 0 refills | Status: DC | PRN

## 2019-08-26 MED ORDER — HYDROCHLOROTHIAZIDE 12.5 MG PO TABS: 12.5000 mg | ORAL_TABLET | Freq: Every day | ORAL | 0 refills | Status: DC

## 2019-08-26 MED ORDER — TRELEGY ELLIPTA 100-62.5-25 MCG/INH IN AEPB: 1.0000 | INHALATION_SPRAY | Freq: Every day | RESPIRATORY_TRACT | 3 refills | Status: DC

## 2019-08-26 NOTE — Progress Notes (Signed)
Name: Destiny Atkinson   MRN: [id number]    DOB: 1976/03/27   Date:08/26/2019       Progress Note  Subjective  Chief Complaint  Chief Complaint  Patient presents with  . Anxiety  . Depression  . Hypertension  . Asthma    I connected with  Destiny Atkinson  on 08/26/19 at 10:20 AM EDT by a video enabled telemedicine application and verified that I am speaking with the correct person using two identifiers.  I discussed the limitations of evaluation and management by telemedicine and the availability of in person appointments. The patient expressed understanding and agreed to proceed. Staff also discussed with the patient that there may be a patient responsible charge related to this service. Patient Location: at work   Provider Location: [facility]   HPI  Migraine :She has one episode in about every 3 month, last episode was about one month ago . Not associated nausea or vomiting, but is associated with photophobia. Migraine is described as a sharp pain over left eye and radiates to left temporal area and sometimes nuchal area.She takesMaxaltprn and states it works within 30 minutes.  HTN:we stopped Losartan back in June 2019 because of dizziness and is doing well on HCTZ by itself. No chest pain , occasionally has dizziness . BP at home 118/87 or around that   GAD/MajorDepression: she left [employer] and started a new job at [employer] in [place] back in May 2017, she left [employer] 2018 when she walked away from her job, and was unemployed until 06/2017. 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 She is back on Lexapro10 mg daily, she still worries about her oldest daughter that has depression, her husband does not understand depression. She still wakes up at night with panic attacks and is taking BZD prn   RLS/Peripheral Neuropathy: She has a need to move her legs when sitting down, also states that at night her legs moves constantly. Numbness had been intermittent on hands and feet. Last B12 was normal but she is on supplementation and we need to recheck labs, since symptoms improves with supplementation and worse when she stops it   AR: she his allergic to cats, but has 3 cats at home, also allergic to dogs and has one of them,she is using nasal steroid and otc allergy medication, she also has seasonal allergic . She has itchy eyes and nose, also her throat . She states symptoms are stable   Asthma/COPD: she still smokes,still 5-6  cigarettes daily. She statescough has improved but has,she states since she started using Symbicort daily and still has wheezing and symptoms are worse during the Fall, we will try switching to Trelegy   Dyslipidemia:reviewed labs with patient done 04/2018, still has high triglycerides and low HDL, trying to eat healthier. She is cutting down on fried food, cooking more at home. Explain it is time to recheck levels  Metabolic syndrome: labs hgbA1C was 5.8%, monitoring the carbohydrate intake. She was recently on prednisone taper and gained some weight, but starting to lose it again She denies polyphagia, polydipsia or polyuria. Recheck labs   Patient Active Problem List   Diagnosis Date Noted  . Pre-diabetes 11/17/2017  . Vitamin D deficiency 12/28/2016  . Mild major depression (HCC) 11/17/2015  . GAD (generalized anxiety  disorder) 05/06/2015  . Asthma, moderate persistent 05/06/2015  . Mobitz type I incomplete atrioventricular block 05/06/2015  . B12 deficiency 05/06/2015  . Essential (primary) hypertension 05/06/2015  . Irregular bleeding 05/06/2015  . Migraine without aura and responsive to treatment 05/06/2015  . Lack of erotic interest 05/06/2015  . BMI 31.0-31.9,adult 05/06/2015  . Peripheral neuropathy 05/06/2015  . Perennial allergic rhinitis  05/06/2015  . Restless leg 05/06/2015  . Tobacco abuse 05/06/2015    Past Surgical History:  Procedure Laterality Date  . COLONOSCOPY     in 8th grade, not as adult  . ESOPHAGOGASTRODUODENOSCOPY     age 43  . NASAL SINUS SURGERY  01/21/2010   septoplasty/ sinus surgery  . WISDOM TOOTH EXTRACTION  age 43    Family History  Problem Relation Age of Onset  . COPD Mother   . Fibromyalgia Mother   . Hypertension Mother   . Stroke Mother   . Asthma Mother   . Arthritis Mother   . Depression Mother   . Heart disease Mother   . Crohn's disease Mother   . Diabetes Father   . Epilepsy Brother   . Stomach cancer Paternal Grandmother [id number]  . Ovarian cancer Other 45  . Ovarian cancer Other 50  . Diabetes type II Daughter   . Bipolar disorder Maternal Grandmother     Social History   Socioeconomic History  . Marital status: Married    Spouse name: Reuel BoomDaniel  . Number of children: 2  . Years of education: 2714  . Highest education level: Some college, no degree  Occupational History  . Occupation: Associate Professorwarehouse manager     Employer: [employer]  Social Needs  . Financial resource strain: Hard  . Food insecurity    Worry: Never true    Inability: Never true  . Transportation needs    Medical: No    Non-medical: No  Tobacco Use  . Smoking status: Current Every Day Smoker    Packs/day: 0.25    Years: 24.00    Pack years: 6.00    Types: Cigarettes, E-cigarettes    Start date: 06/29/1995  . Smokeless tobacco: Never Used  Substance and Sexual Activity  . Alcohol use: Yes    Alcohol/week: 0.0 standard drinks    Comment: rarely  . Drug use: No  . Sexual activity: Yes    Partners: Male    Birth control/protection: I.U.D.  Lifestyle  . Physical activity    Days per week: 3 days    Minutes per session: 20 min  . Stress: Not at all  Relationships  . Social Musicianconnections    Talks on phone: Three times a week    Gets together: Once a week    Attends religious  service: Never    Active member of club or organization: No    Attends meetings of clubs or organizations: Never    Relationship status: Married  . Intimate partner violence    Fear of current or ex partner: No    Emotionally abused: No    Physically abused: No    Forced sexual activity: No  Other Topics Concern  . 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  albuterol (PROVENTIL HFA;VENTOLIN HFA) 108 (90 Base) MCG/ACT inhaler, Inhale 2 puffs into the lungs every 6 (six) hours as needed  for wheezing or shortness of breath., Disp: 1 Inhaler, Rfl: 1 .  ALPRAZolam (XANAX) 0.5 MG tablet, Take 1 tablet (0.5 mg total) by mouth daily as needed for anxiety., Disp: 5 tablet, Rfl: 0 .  budesonide-formoterol (SYMBICORT) 160-4.5 MCG/ACT inhaler, USE 2 INHALATIONS TWICE A DAY, Disp: 30.6 g, Rfl: 3 .  escitalopram (LEXAPRO) 10 MG tablet, TAKE 1 TABLET DAILY, Disp: 90 tablet, Rfl: 3 .  fluticasone (FLONASE) 50 MCG/ACT nasal spray, Place 2 sprays into both nostrils daily., Disp: 48 g, Rfl: 2 .  hydrochlorothiazide (HYDRODIURIL) 12.5 MG tablet, Take 1 tablet (12.5 mg total) by mouth daily., Disp: 90 tablet, Rfl: 0 .  hydrOXYzine (ATARAX/VISTARIL) 25 MG tablet, TAKE 1 TABLET AT BEDTIME, Disp: 90 tablet, Rfl: 3 .  ipratropium-albuterol (DUONEB) 0.5-2.5 (3) MG/3ML SOLN, Take 3 mLs by nebulization every 6 (six) hours as needed., Disp: 360 mL, Rfl: 1 .  levocetirizine (XYZAL) 5 MG tablet, Take 1 tablet (5 mg total) by mouth daily., Disp: 90 tablet, Rfl: 1 .  rizatriptan (MAXALT-MLT) 10 MG disintegrating tablet, Take 1 tablet (10 mg total) by mouth as needed for migraine., Disp: 9 tablet, Rfl: 0 .  varenicline (CHANTIX CONTINUING MONTH PAK) 1 MG tablet, Take 1 tablet (1 mg total) by mouth 2 (two) times daily., Disp: 180 tablet, Rfl: 2 .  Vitamin D, Ergocalciferol,  (DRISDOL) 1.25 MG (50000 UT) CAPS capsule, TAKE 1 CAPSULE ONCE A WEEK, Disp: 12 capsule, Rfl: 3 .  Levonorgestrel (LILETTA, 52 MG,) 19.5 MCG/DAY IUD IUD, 1 Intra Uterine Device (1 each total) by Intrauterine route once for 1 dose., Disp: 1 each, Rfl: 0  Allergies  Allergen Reactions  . Dog Epithelium     Anaphylaxis with Rabbits.  Dogs, cats, birds cause swelling, extreme itching.  . Dust Mite Extract   . Pollen Extract   . Soap   . Tape   . Tree Extract     I personally reviewed active problem list, medication list, allergies, family history, social history with the patient/caregiver today.   ROS  Ten systems reviewed and is negative except as mentioned in HPI   Objective  Virtual encounter, vitals not obtained.  Body mass index is 31.17 kg/m.  Physical Exam  Awake, alert and oriented   PHQ2/9: Depression screen [facility] 2/9 08/26/2019 05/23/2019 02/14/2019 01/15/2019 01/08/2019  Decreased Interest 1 0 1 0 0  Down, Depressed, Hopeless 0 0 1 0 1  PHQ - 2 Score 1 0 2 0 1  Altered sleeping 1 0 3 3 1   Tired, decreased energy 1 0 3 3 1   Change in appetite 0 0 3 0 0  Feeling bad or failure about yourself  0 0 1 1 0  Trouble concentrating 1 0 1 1 1   Moving slowly or fidgety/restless 0 0 3 0 0  Suicidal thoughts 0 0 0 0 0  PHQ-9 Score 4 0 16 8 4   Difficult doing work/chores Somewhat difficult Not difficult at all Somewhat difficult Somewhat difficult Somewhat difficult  Some recent data might be hidden   PHQ-2/9 Result is positive.    Fall Risk: Fall Risk  05/23/2019 02/14/2019 01/15/2019 01/08/2019 11/12/2018  Falls in the past year? 0 0 0 0 0  Number falls in past yr: 0 0 0 0 0  Injury with Fall? 0 0 0 0 0  Follow up - - - Falls evaluation completed -   GAD 7 : Generalized Anxiety Score 08/26/2019 02/14/2019 01/15/2019 11/12/2018  Nervous, Anxious, on Edge 1 3 1  1  Control/stop worrying 0 1 1 1   Worry too much - different things 1 3 1 1   Trouble relaxing 1 1 3 1   Restless 0 3 3  1   Easily annoyed or irritable 2 1 2 1   Afraid - awful might happen 1 2 0 0  Total GAD 7 Score 6 14 11 6   Anxiety Difficulty Somewhat difficult Very difficult Somewhat difficult -    Assessment & Plan  1. GAD (generalized anxiety disorder)  - ALPRAZolam (XANAX) 0.5 MG tablet; Take 1 tablet (0.5 mg total) by mouth daily as needed for anxiety.  Dispense: 30 tablet; Refill: 0  2. COPD with asthma (HCC)  - Fluticasone-Umeclidin-Vilant (TRELEGY ELLIPTA) 100-62.5-25 MCG/INH AEPB; Inhale 1 puff into the lungs daily.  Dispense: 60 each; Refill: 3  3. B12 deficiency  - Vitamin B12  4. Vitamin D deficiency  - VITAMIN D 25 Hydroxy (Vit-D Deficiency, Fractures)  5. Essential hypertension  - CBC with Differential/Platelet - Comprehensive metabolic panel - hydrochlorothiazide (HYDRODIURIL) 12.5 MG tablet; Take 1 tablet (12.5 mg total) by mouth daily.  Dispense: 90 tablet; Refill: 0  6. Mild major depression (HCC)  - Comprehensive metabolic panel  7. Dyslipidemia  - Lipid panel  8. Tobacco abuse  She still has chantix at home  9. Metabolic syndrome  - Hemoglobin A1c  I discussed the assessment and treatment plan with the patient. The patient was provided an opportunity to ask questions and all were answered. The patient agreed with the plan and demonstrated an understanding of the instructions.  The patient was advised to call back or seek an in-person evaluation if the symptoms worsen or if the condition fails to improve as anticipated.  I provided 25  minutes of non-face-to-face time during this encounter.

## 2019-09-07 ENCOUNTER — Other Ambulatory Visit: Payer: Self-pay | Admitting: Family Medicine

## 2019-09-07 DIAGNOSIS — I10 Essential (primary) hypertension: Secondary | ICD-10-CM

## 2019-09-07 NOTE — Telephone Encounter (Signed)
Requested Prescriptions  Pending Prescriptions Disp Refills  . hydrochlorothiazide (HYDRODIURIL) 12.5 MG tablet [Pharmacy Med Name: HYDROCHLOROTHIAZIDE TABS 12.5MG ] 90 tablet 3    Sig: TAKE 1 TABLET DAILY     Cardiovascular: Diuretics - Thiazide Failed - 09/07/2019 12:15 AM      Failed - Ca in normal range and within 360 days    Calcium  Date Value Ref Range Status  05/11/2018 9.5 8.6 - 10.2 mg/dL Final         Failed - Cr in normal range and within 360 days    Creat  Date Value Ref Range Status  05/11/2018 0.77 0.50 - 1.10 mg/dL Final         Failed - K in normal range and within 360 days    Potassium  Date Value Ref Range Status  05/11/2018 4.1 3.5 - 5.3 mmol/L Final         Failed - Na in normal range and within 360 days    Sodium  Date Value Ref Range Status  05/11/2018 139 135 - 146 mmol/L Final         Passed - Last BP in normal range    BP Readings from Last 1 Encounters:  01/28/19 122/74         Passed - Valid encounter within last 6 months    Recent Outpatient Visits          1 week ago GAD (generalized anxiety disorder)   [facility] Steele Sizer, MD   3 months ago Suspected Covid-19 Virus Infection   [place], NP   6 months ago Moderate major depression [facility])   [facility] Steele Sizer, MD   7 months ago Cough   [place], NP   7 months ago [place], NP      Future Appointments            In 3 months Ancil Boozer, Drue Stager, MD [facility], [facility]

## 2019-09-16 IMAGING — CR CHEST - 2 VIEW
1 series · 2 of 2 positions shown · non-contrast
Comparison: 09/22/2015

CLINICAL DATA: Cough and shortness of breath for 3 weeks.

EXAM:
CHEST - 2 VIEW

[Series 1: dg chest 2 view · 0.14mm/px · 2 of 2 slices shown]
[im 1/2]
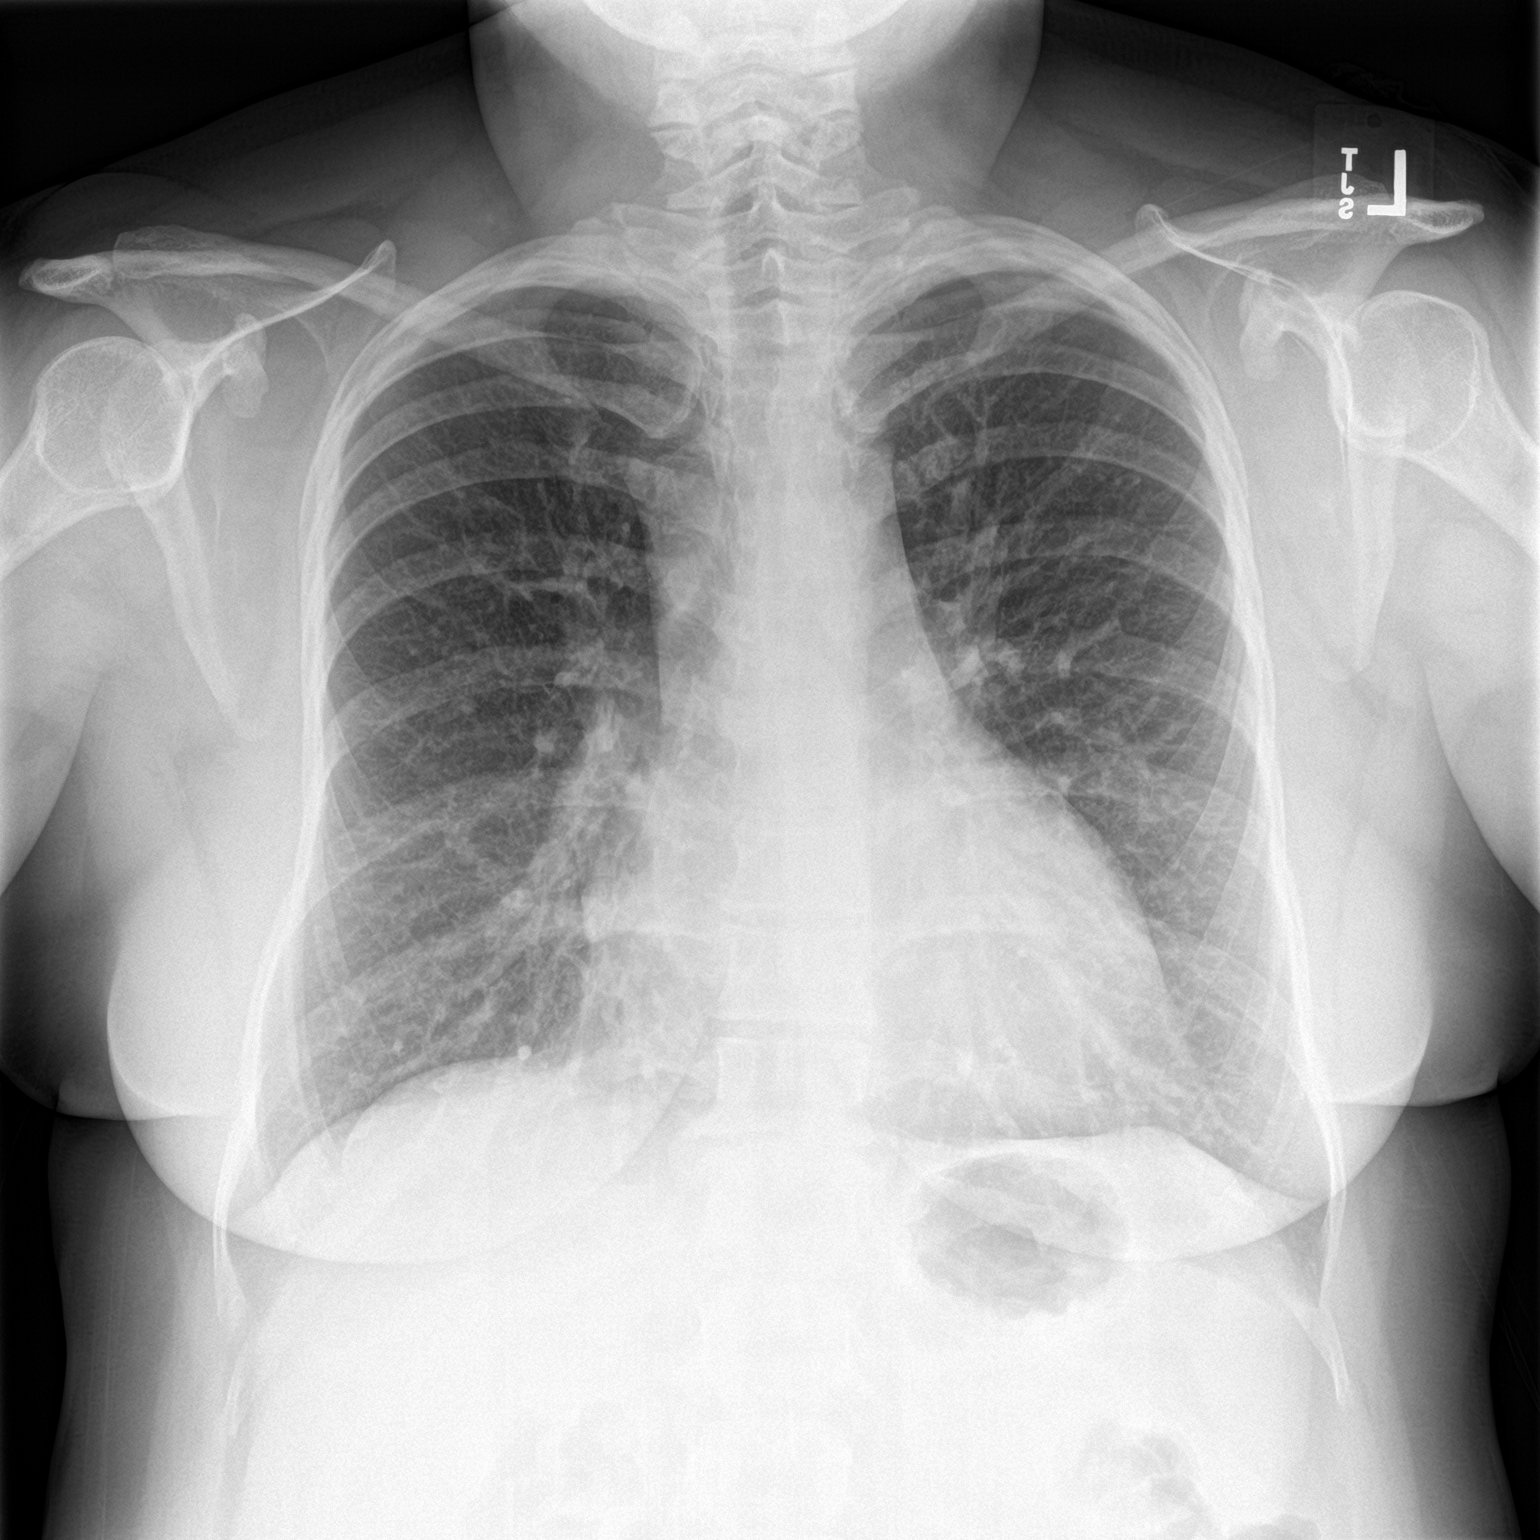
[im 2/2]
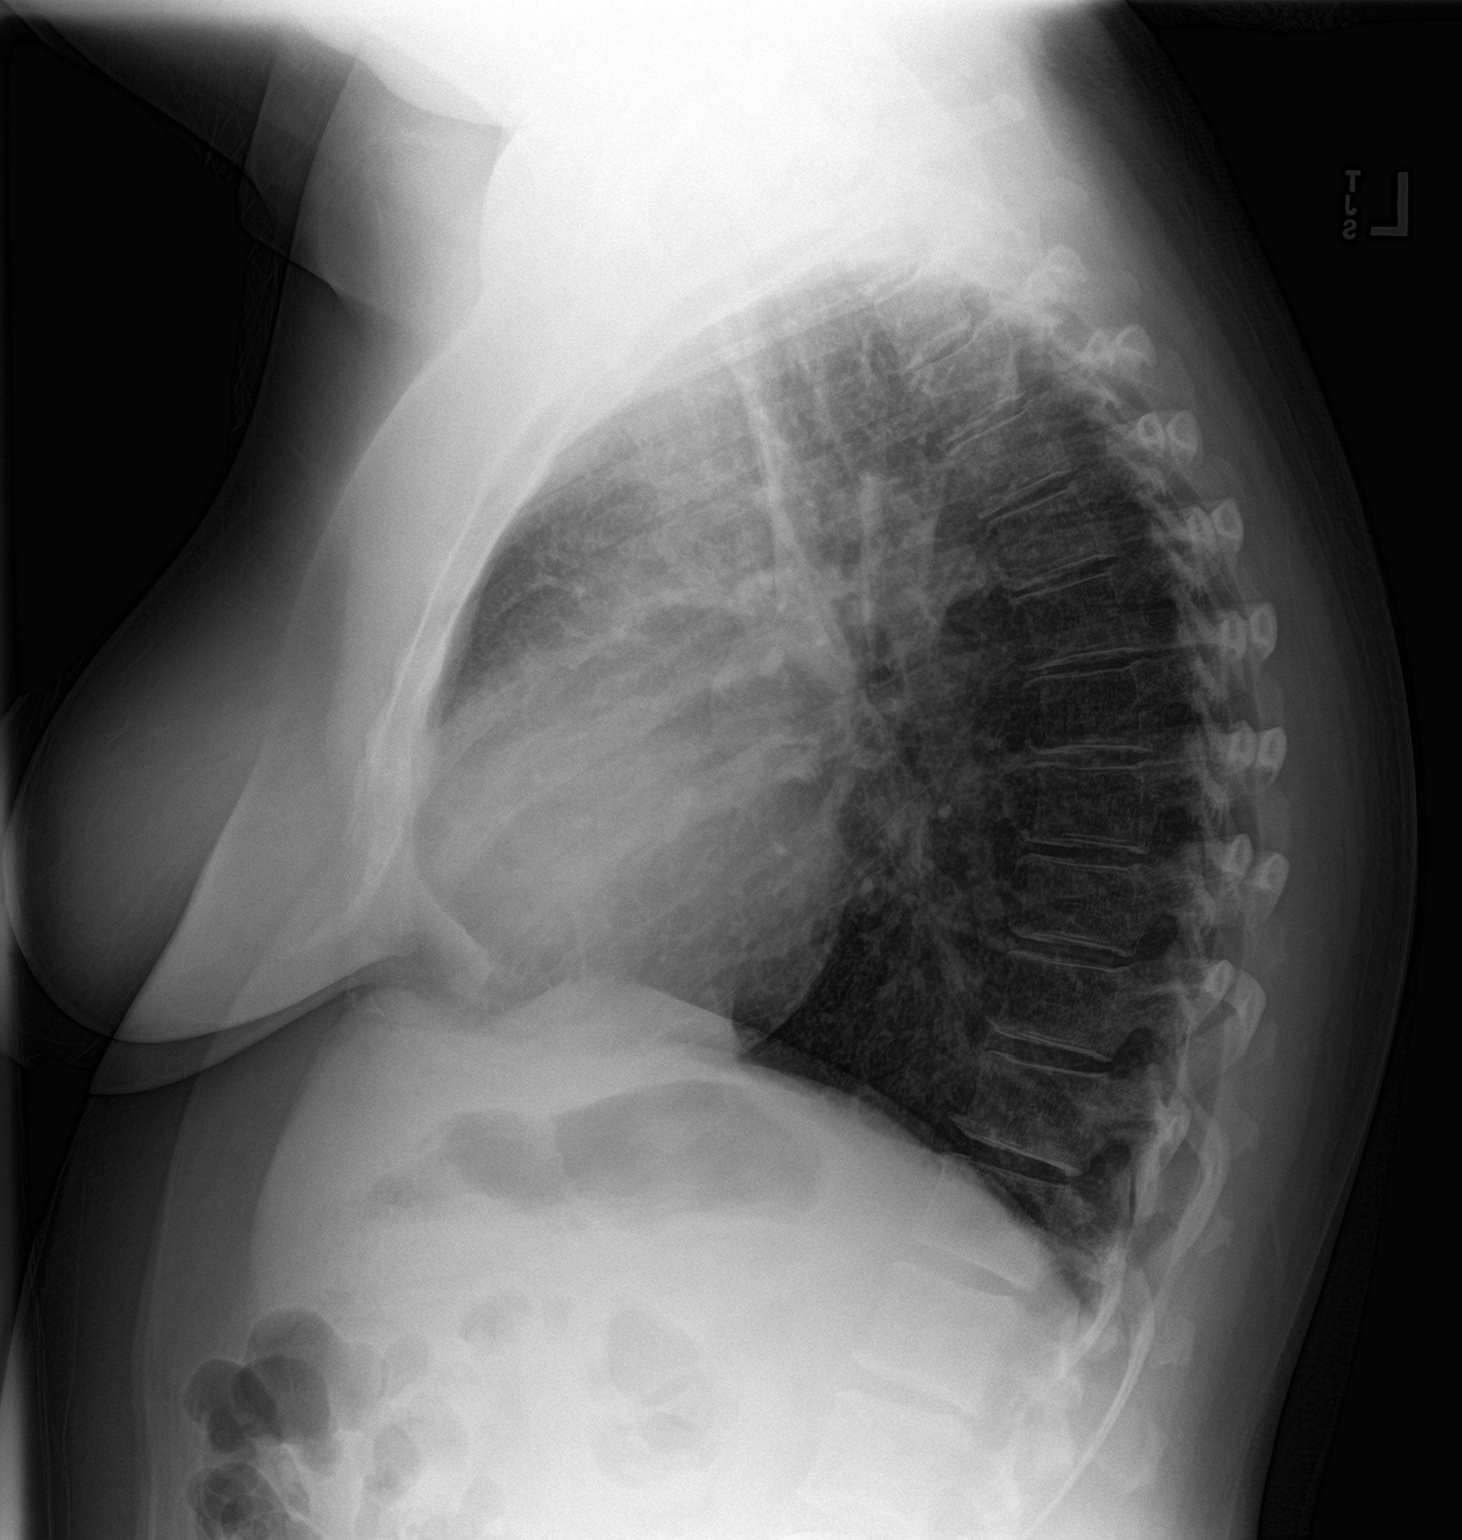

[2 of 2 positions shown; findings below may reference images not displayed]

FINDINGS: The heart size and mediastinal contours are within normal limits.
Both lungs are clear. The visualized skeletal structures are
unremarkable.
IMPRESSION: Stable exam.  No active cardiopulmonary disease.

## 2019-11-14 ENCOUNTER — Telehealth: Payer: Self-pay

## 2019-11-14 ENCOUNTER — Telehealth: Payer: BC Managed Care – PPO | Admitting: Physician Assistant

## 2019-11-14 DIAGNOSIS — Z20828 Contact with and (suspected) exposure to other viral communicable diseases: Secondary | ICD-10-CM | POA: Diagnosis not present

## 2019-11-14 DIAGNOSIS — Z20822 Contact with and (suspected) exposure to covid-19: Secondary | ICD-10-CM

## 2019-11-14 MED ORDER — BENZONATATE 100 MG PO CAPS: 100.0000 mg | ORAL_CAPSULE | Freq: Three times a day (TID) | ORAL | 0 refills | Status: DC | PRN

## 2019-11-14 NOTE — Telephone Encounter (Signed)
Copied from [place] [id number]. Topic: General - Call Back - No Documentation >> Nov 14, 2019 11:09 AM Erick Blinks wrote: Reason for CRM: Pt called reporting complete loss of taste and smell, advised about [place] covid testing.

## 2019-11-14 NOTE — Progress Notes (Addendum)
E-Visit for Corona Virus Screening   Your current symptoms could be consistent with the coronavirus.  Many health care providers can now test patients at their office but not all are.  [place] has multiple testing sites. For information on our COVID testing locations and hours go to [email or web address]  We are enrolling you in our MyChart Home Monitoring for COVID19 . Daily you will receive a questionnaire within the MyChart website. Our COVID 19 response team will be monitoring your responses daily.  Testing Information: The COVID-19 Community Testing sites will begin testing BY APPOINTMENT ONLY.  You can schedule online at [email or web address]  If you do not have access to a smart phone or computer you may call [phone] for an appointment.  Testing Locations: Appointment schedule is 8 am to 3:30 pm at all sites  [place] indoors at [address], [place] [address] [facility]  indoors at [address] Vis[address] Entrance, [place], [place] [address] [place] indoors at [address], [place] [address]  Additional testing sites in the Community:  . For [facility] Testing sites in [place]  https://www.cvs.com/minuteclinic/covid-19-testing  . For Pop-up testing sites in [place]  https://covid19.ncdhhs.gov/about-covid-19/testing/find-my-testing-place/pop-testing-sites  . For Testing sites with regular hours https://onsms.org/[place]/  . For [place] https://[facility].com/covid-19-community-outreach-testing/  . For [facility] https://www.[place].gov/[place]ounty/human-services/health-department/coronavirus-covid-19-info/covid-19-testing  . For [place] testing in [place] https://www.[place].gov/[place]-county/human-services/health-department/coronavirus-covid-19-info/covid-19-testing  . For [facility] testing in [place]   https://lhi.care/covidtesting  For  more  information about community testing call [phone]   We are enrolling you in our MyChart Home Monitoring for COVID19 . Daily you will receive a questionnaire within the MyChart website. Our COVID 19 response team will be monitoring your responses daily.  Please quarantine yourself while awaiting your test results. If you develop fever/cough/breathlessness, please stay home for 10 days with improving symptoms and until you have had 24 hours of no fever (without taking a fever reducer).  You should wear a mask or cloth face covering over your nose and mouth if you must be around other people or animals, including pets (even at home). Try to stay at least 6 feet away from other people. This will protect the people around you.  Please continue good preventive care measures, including:  frequent hand-washing, avoid touching your face, cover coughs/sneezes, stay out of crowds and keep a 6 foot distance from others.  COVID-19 is a respiratory illness with symptoms that are similar to the flu. Symptoms are typically mild to moderate, but there have been cases of severe illness and death due to the virus.   The following symptoms may appear 2-14 days after exposure: . Fever . Cough . Shortness of breath or difficulty breathing . Chills . Repeated shaking with chills . Muscle pain . Headache . Sore throat . New loss of taste or smell . Fatigue . Congestion or runny nose . Nausea or vomiting . Diarrhea  Go to the nearest hospital ED for assessment if fever/cough/breathlessness are severe or illness seems like a threat to life.  It is vitally important that if you feel that you have an infection such as this virus or any other virus that you stay home and away from places where you may spread it to others.  You should avoid contact with people age 65 and older.   You can use medication such as A prescription cough medication called Tessalon Perles 100 mg. You may take 1-2 capsules every 8 hours as    needed for cough  You may also take acetaminophen (Tylenol) as needed for fever.  Reduce your risk of any infection by using the same precautions used for avoiding the common cold or flu:  . Wash your hands often with soap and warm water for at least 20 seconds.  If soap and water are not readily available, use an alcohol-based hand sanitizer with at least 60% alcohol.  . If coughing or sneezing, cover your mouth and nose by coughing or sneezing into the elbow areas of your shirt or coat, into a tissue or into your sleeve (not your hands). . Avoid shaking hands with others and consider head nods or verbal greetings only. . Avoid touching your eyes, nose, or mouth with unwashed hands.  . Avoid close contact with people who are sick. . Avoid places or events with large numbers of people in one location, like concerts or sporting events. . Carefully consider travel plans you have or are making. . If you are planning any travel outside or inside the [place], visit the CDC's Travelers' Health webpage for the latest health notices. . If you have some symptoms but not all symptoms, continue to monitor at home and seek medical attention if your symptoms worsen. . If you are having a medical emergency, call 911.  HOME CARE . Only take medications as instructed by your medical team. . Drink plenty of fluids and get plenty of rest. . A steam or ultrasonic humidifier can help if you have congestion.   GET HELP RIGHT AWAY IF YOU HAVE EMERGENCY WARNING SIGNS** FOR COVID-19. If you or someone is showing any of these signs seek emergency medical care immediately. Call 911 or proceed to your closest emergency facility if: . You develop worsening high fever. . Trouble breathing . Bluish lips or face . Persistent pain or pressure in the chest . New confusion . Inability to wake or stay awake . You cough up blood. . Your symptoms become more severe  **This list is not all possible symptoms. Contact your  medical provider for any symptoms that are sever or concerning to you.  MAKE SURE YOU   Understand these instructions.  Will watch your condition.  Will get help right away if you are not doing well or get worse.  Your e-visit answers were reviewed by a board certified advanced clinical practitioner to complete your personal care plan.  Depending on the condition, your plan could have included both over the counter or prescription medications.  If there is a problem please reply once you have received a response from your provider.  Your safety is important to us.  If you have drug allergies check your prescription carefully.    You can use MyChart to ask questions about today's visit, request a non-urgent call back, or ask for a work or school excuse for 24 hours related to this e-Visit. If it has been greater than 24 hours you will need to follow up with your provider, or enter a new e-Visit to address those concerns. You will get an e-mail in the next two days asking about your experience.  I hope that your e-visit has been valuable and will speed your recovery. Thank you for using e-visits.  Zephyra Bernardi PA-C   Approximately 5 minutes was spent documenting and reviewing patient's chart.   

## 2019-11-18 ENCOUNTER — Ambulatory Visit: Payer: BC Managed Care – PPO | Attending: Internal Medicine

## 2019-11-18 DIAGNOSIS — Z20828 Contact with and (suspected) exposure to other viral communicable diseases: Secondary | ICD-10-CM | POA: Diagnosis not present

## 2019-11-18 DIAGNOSIS — Z20822 Contact with and (suspected) exposure to covid-19: Secondary | ICD-10-CM

## 2019-11-20 LAB — NOVEL CORONAVIRUS, NAA: SARS-CoV-2, NAA: NOT DETECTED

## 2019-11-20 MED ORDER — AMOXICILLIN-POT CLAVULANATE 875-125 MG PO TABS: 1.0000 | ORAL_TABLET | Freq: Two times a day (BID) | ORAL | 0 refills | Status: DC

## 2019-11-20 NOTE — Addendum Note (Signed)
Addended by: Evelina Dun A on: 11/20/2019 02:30 PM   Modules accepted: Orders

## 2019-11-24 ENCOUNTER — Encounter: Payer: Self-pay | Admitting: Family Medicine

## 2019-11-25 ENCOUNTER — Other Ambulatory Visit: Payer: Self-pay | Admitting: Family Medicine

## 2019-11-25 MED ORDER — FLUCONAZOLE 150 MG PO TABS: 150.0000 mg | ORAL_TABLET | ORAL | 0 refills | Status: DC

## 2019-12-20 ENCOUNTER — Ambulatory Visit (INDEPENDENT_AMBULATORY_CARE_PROVIDER_SITE_OTHER): Payer: BC Managed Care – PPO | Admitting: Family Medicine

## 2019-12-20 ENCOUNTER — Other Ambulatory Visit: Payer: Self-pay

## 2019-12-20 ENCOUNTER — Encounter: Payer: Self-pay | Admitting: Family Medicine

## 2019-12-20 VITALS — BP 132/76 | HR 95 | Temp 96.9°F | Resp 16 | Ht 67.0 in | Wt 190.7 lb

## 2019-12-20 DIAGNOSIS — F411 Generalized anxiety disorder: Secondary | ICD-10-CM | POA: Diagnosis not present

## 2019-12-20 DIAGNOSIS — G43009 Migraine without aura, not intractable, without status migrainosus: Secondary | ICD-10-CM

## 2019-12-20 DIAGNOSIS — I10 Essential (primary) hypertension: Secondary | ICD-10-CM | POA: Diagnosis not present

## 2019-12-20 DIAGNOSIS — E785 Hyperlipidemia, unspecified: Secondary | ICD-10-CM

## 2019-12-20 DIAGNOSIS — E538 Deficiency of other specified B group vitamins: Secondary | ICD-10-CM | POA: Diagnosis not present

## 2019-12-20 DIAGNOSIS — J449 Chronic obstructive pulmonary disease, unspecified: Secondary | ICD-10-CM

## 2019-12-20 DIAGNOSIS — E559 Vitamin D deficiency, unspecified: Secondary | ICD-10-CM

## 2019-12-20 DIAGNOSIS — F320 Major depressive disorder, single episode, mild: Secondary | ICD-10-CM

## 2019-12-20 DIAGNOSIS — F338 Other recurrent depressive disorders: Secondary | ICD-10-CM

## 2019-12-20 DIAGNOSIS — G629 Polyneuropathy, unspecified: Secondary | ICD-10-CM

## 2019-12-20 DIAGNOSIS — E669 Obesity, unspecified: Secondary | ICD-10-CM

## 2019-12-20 MED ORDER — TRELEGY ELLIPTA 100-62.5-25 MCG/INH IN AEPB: 1.0000 | INHALATION_SPRAY | Freq: Every day | RESPIRATORY_TRACT | 3 refills | Status: DC

## 2019-12-20 MED ORDER — ALPRAZOLAM 0.5 MG PO TABS: 0.5000 mg | ORAL_TABLET | Freq: Every day | ORAL | 0 refills | Status: DC | PRN

## 2019-12-20 NOTE — Progress Notes (Signed)
Name: Destiny Atkinson   MRN: [id number]    DOB: [birth year]   Date:12/20/2019       Progress Note  Subjective  Chief Complaint  Chief Complaint  Patient presents with  . Medication Refill    4 month F/U  . Anxiety  . Depression  . Hypertension  . Asthma  . Allergic Rhinitis   . Dyslipidemia    HPI  Migraine :She has two  episodes in the past 4 months.  Not associated nausea or vomiting, but is associated with photophobia. Migraine is described as a sharp pain over left eye and radiates to left temporal area and sometimes nuchal area.She takesMaxaltprn and states it works within 30 minutes.She has not used Maxalt in  Months, she states sometimes takes Excedrin migraine  HTN:we stopped Losartan back in June 2019 because of dizziness and is doing well on HCTZ by itself. No chest pain, states dizziness resolved, bp is at goal today. She has been walking 6 day a week.   GAD/MajorDepression: she left [place] and started a new job at [employer]  in [place] back in May 2017, she left [employer] when she walked away from her job, and was unemployed until 06/2017. 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  She is back on Lexapro10 mg daily, oldest daughter is doing better seeing psychiatrist and therapist but now thinks her youngest that is 17 seems anxious and will get her to see a therapist also. She  wakes up at night with panic attacks and takes Xanax prn, 30 pills lasts 3-4 months   RLS/Peripheral Neuropathy: She has a need to move her legs when sitting down, also states that at night her legs moves constantly. Numbness had been intermittent on hands and feet. Last B12 was normal but she is on supplementation and we are checking labs today   AR: she his allergic to cats, but has 3 cats at home, also allergic to dogs and has one of them,she does not like  nasal steroid but is  taking Xyzal otc  she also has seasonal allergic . She has itchy eyes and nose, also her throat . She states symptoms are controlled with medication  Asthma/COPD: she still smokes,still 5-6  cigarettes daily. She states symptoms have improved with Trelegy. She states wheezing has decreased also not having severe sob, she has a cough but not severe now   Dyslipidemia:reviewed labs with patient done 04/2018, still has high triglycerides and low HDL, trying to eat healthier.Eating out but trying to order salads, vegetables and lean meat   Metabolic syndrome: labs ZHGD9M was5.8%, monitoring the carbohydrate intake. She denies polyphagia, polydipsia or polyuria. She has been walking and has lost some weight   Patient Active Problem List   Diagnosis Date Noted  . Pre-diabetes 11/17/2017  . Vitamin D deficiency 12/28/2016  . Mild major depression ([place]) 11/17/2015  . GAD (generalized anxiety disorder) 05/06/2015  . Asthma, moderate persistent 05/06/2015  . Mobitz type I incomplete atrioventricular block 05/06/2015  . B12 deficiency 05/06/2015  . Essential (primary) hypertension 05/06/2015  . Irregular bleeding 05/06/2015  . Migraine without aura and responsive to treatment 05/06/2015  . Lack of erotic interest 05/06/2015  . BMI 31.0-31.9,adult 05/06/2015  . Peripheral neuropathy 05/06/2015  . Perennial allergic rhinitis 05/06/2015  . Restless leg 05/06/2015  . Tobacco abuse 05/06/2015    Past Surgical History:  Procedure Laterality Date  . COLONOSCOPY     in 8th grade, not as adult  . ESOPHAGOGASTRODUODENOSCOPY     age 59  . NASAL SINUS SURGERY  01/21/2010   septoplasty/ sinus surgery  . WISDOM TOOTH EXTRACTION  age 49    Family History  Problem Relation Age of Onset  . COPD Mother   . Fibromyalgia Mother   . Hypertension Mother   . Stroke Mother   . Asthma Mother   . Arthritis Mother   . Depression Mother   . Heart disease Mother   . Crohn's disease Mother   .  Diabetes Father   . Epilepsy Brother   . Stomach cancer Paternal Grandmother 14  . Ovarian cancer Other 45  . Ovarian cancer Other 50  . Diabetes type II Daughter   . Bipolar disorder Maternal Grandmother      Current Outpatient Medications:  .  ALPRAZolam (XANAX) 0.5 MG tablet, Take 1 tablet (0.5 mg total) by mouth daily as needed for anxiety., Disp: 30 tablet, Rfl: 0 .  escitalopram (LEXAPRO) 10 MG tablet, TAKE 1 TABLET DAILY, Disp: 90 tablet, Rfl: 3 .  Fluticasone-Umeclidin-Vilant (TRELEGY ELLIPTA) 100-62.5-25 MCG/INH AEPB, Inhale 1 puff into the lungs daily., Disp: 60 each, Rfl: 3 .  hydrochlorothiazide (HYDRODIURIL) 12.5 MG tablet, TAKE 1 TABLET DAILY, Disp: 90 tablet, Rfl: 3 .  hydrOXYzine (ATARAX/VISTARIL) 25 MG tablet, TAKE 1 TABLET AT BEDTIME, Disp: 90 tablet, Rfl: 3 .  ipratropium-albuterol (DUONEB) 0.5-2.5 (3) MG/3ML SOLN, Take 3 mLs by nebulization every 6 (six) hours as needed., Disp: 360 mL, Rfl: 1 .  levocetirizine (XYZAL) 5 MG tablet, Take 1 tablet (5 mg total) by mouth daily., Disp: 90 tablet, Rfl: 1 .  rizatriptan (MAXALT-MLT) 10 MG disintegrating tablet, Take 1 tablet (10 mg total) by mouth as needed for migraine., Disp: 9 tablet, Rfl: 0 .  Vitamin D, Ergocalciferol, (DRISDOL) 1.25 MG (50000 UT) CAPS capsule, TAKE 1 CAPSULE ONCE A WEEK, Disp: 12 capsule, Rfl: 3 .  Levonorgestrel (LILETTA, 52 MG,) 19.5 MCG/DAY IUD IUD, 1 Intra Uterine Device (1 each total) by Intrauterine route once for 1 dose., Disp: 1 each, Rfl: 0  Allergies  Allergen Reactions  . Dog Epithelium     Anaphylaxis with Rabbits.  Dogs, cats, birds cause swelling, extreme itching.  . Dust Mite Extract   . Pollen Extract   . Soap   . Tape   . Tree Extract     I personally reviewed active problem list, medication list, allergies, family history, social history with the patient/caregiver today.   ROS  Constitutional: Negative for fever or weight change.  Respiratory: Negative for cough and shortness  of breath.   Cardiovascular: Negative for chest pain or palpitations.  Gastrointestinal: Negative for abdominal pain, no bowel changes.  Musculoskeletal: Negative for gait problem or joint swelling.  Skin: Negative for rash.  Neurological: Negative for dizziness or headache.  No other specific complaints in a complete review of systems (except as listed in HPI above).  Objective  Vitals:   12/20/19 1528  BP: 132/76  Pulse: 95  Resp: 16  Temp: (!) 96.9 F (36.1 C)  TempSrc: Temporal  SpO2: 97%  Weight: 190 lb 11.2 oz (86.5 kg)  Height: 5\' 7"  (1.702 m)    Body mass index is 29.87 kg/m.  Physical Exam  Constitutional: Patient appears well-developed and well-nourished. Overweight.  No distress.  HEENT: head atraumatic, normocephalic, pupils equal and reactive to light Cardiovascular: Normal rate, regular rhythm and  normal heart sounds.  No murmur heard. No BLE edema. Pulmonary/Chest: Effort normal and breath sounds normal. No respiratory distress. Abdominal: Soft.  There is no tenderness. Psychiatric: Patient has a normal mood and affect. behavior is normal. Judgment and thought content normal.  Recent Results (from the past 2160 hour(s))  Novel Coronavirus, NAA ([facility])     Status: None   Collection Time: 11/18/19 12:37 PM   Specimen: Nasopharyngeal(NP) swabs in vial transport medium   NASOPHARYNGE  TESTING  Result Value Ref Range   SARS-CoV-2, NAA Not Detected Not Detected    Comment: This nucleic acid amplification test was developed and its performance characteristics determined by [employer]. Nucleic acid amplification tests include PCR and TMA. This test has not been FDA cleared or approved. This test has been authorized by FDA under an Emergency Use Authorization (EUA). This test is only authorized for the duration of time the declaration that circumstances exist justifying the authorization of the emergency use of in vitro diagnostic tests for detection  of SARS-CoV-2 virus and/or diagnosis of COVID-19 infection under section 564(b)(1) of the Act, 21 U.S.C. 761PJK-9(T) (1), unless the authorization is terminated or revoked sooner. When diagnostic testing is negative, the possibility of a false negative result should be considered in the context of a patient's recent exposures and the presence of clinical signs and symptoms consistent with COVID-19. An individual without symptoms of COVID-19 and who is not shedding SARS-CoV-2 virus would  expect to have a negative (not detected) result in this assay.       PHQ2/9: Depression screen [facility] 2/9 12/20/2019 08/26/2019 05/23/2019 02/14/2019 01/15/2019  Decreased Interest 1 1 0 1 0  Down, Depressed, Hopeless 1 0 0 1 0  PHQ - 2 Score 2 1 0 2 0  Altered sleeping 3 1 0 3 3  Tired, decreased energy 3 1 0 3 3  Change in appetite 2 0 0 3 0  Feeling bad or failure about yourself  0 0 0 1 1  Trouble concentrating 0 1 0 1 1  Moving slowly or fidgety/restless 0 0 0 3 0  Suicidal thoughts 0 0 0 0 0  PHQ-9 Score 10 4 0 16 8  Difficult doing work/chores Somewhat difficult Somewhat difficult Not difficult at all Somewhat difficult Somewhat difficult  Some recent data might be hidden    phq 9 is positive    Fall Risk: Fall Risk  12/20/2019 05/23/2019 02/14/2019 01/15/2019 01/08/2019  Falls in the past year? 0 0 0 0 0  Number falls in past yr: 0 0 0 0 0  Injury with Fall? 0 0 0 0 0  Follow up - - - - Falls evaluation completed     Functional Status Survey: Is the patient deaf or have difficulty hearing?: No Does the patient have difficulty seeing, even when wearing glasses/contacts?: No Does the patient have difficulty concentrating, remembering, or making decisions?: No Does the patient have difficulty walking or climbing stairs?: No Does the patient have difficulty dressing or bathing?: No Does the patient have difficulty doing errands alone such as visiting a doctor's office or shopping?:  No    Assessment & Plan  1. GAD (generalized anxiety disorder)  - ALPRAZolam (XANAX) 0.5 MG tablet; Take 1 tablet (0.5 mg total) by mouth daily as needed for anxiety.  Dispense: 30 tablet; Refill: 0  2. COPD with asthma (HCC)  - Fluticasone-Umeclidin-Vilant (TRELEGY ELLIPTA) 100-62.5-25 MCG/INH AEPB; Inhale 1 puff into the lungs daily.  Dispense: 60 each; Refill:  3  3. Essential hypertension  At goal   4. B12 deficiency  Labs done today  5. Mild major depression (HCC)  Positive today she has seasonal affective disorder   6. Dyslipidemia  Labs done today   7. Vitamin D deficiency  Taking weekly supplementation   8. Migraine without aura and responsive to treatment  Doing much better   9. Peripheral polyneuropathy  Improves with B12 supplementation   10. Obesity (BMI 30.0-34.9)  Discussed with the patient the risk posed by an increased BMI. Discussed importance of portion control, calorie counting and at least 150 minutes of physical activity weekly. Avoid sweet beverages and drink more water. Eat at least 6 servings of fruit and vegetables daily   11. Seasonal affective disorder (HCC)  On Lexapro, discussed UV lights

## 2019-12-20 NOTE — Addendum Note (Signed)
Addended by: Cynda Familia on: 12/20/2019 03:32 PM   Modules accepted: Orders

## 2019-12-21 LAB — COMPREHENSIVE METABOLIC PANEL
AG Ratio: 2 (calc) (ref 1.0–2.5)
ALT: 13 U/L (ref 6–29)
AST: 14 U/L (ref 10–30)
Albumin: 4.5 g/dL (ref 3.6–5.1)
Alkaline phosphatase (APISO): 63 U/L (ref 31–125)
BUN: 14 mg/dL (ref 7–25)
CO2: 30 mmol/L (ref 20–32)
Calcium: 9.7 mg/dL (ref 8.6–10.2)
Chloride: 102 mmol/L (ref 98–110)
Creat: 1.01 mg/dL (ref 0.50–1.10)
Globulin: 2.3 g/dL (calc) (ref 1.9–3.7)
Glucose, Bld: 133 mg/dL — ABNORMAL HIGH (ref 65–99)
Potassium: 4.5 mmol/L (ref 3.5–5.3)
Sodium: 140 mmol/L (ref 135–146)
Total Bilirubin: 0.6 mg/dL (ref 0.2–1.2)
Total Protein: 6.8 g/dL (ref 6.1–8.1)

## 2019-12-21 LAB — CBC WITH DIFFERENTIAL/PLATELET
Absolute Monocytes: 697 cells/uL (ref 200–950)
Basophils Absolute: 60 cells/uL (ref 0–200)
Basophils Relative: 0.7 %
Eosinophils Absolute: 162 cells/uL (ref 15–500)
Eosinophils Relative: 1.9 %
HCT: 42.1 % (ref 35.0–45.0)
Hemoglobin: 14.7 g/dL (ref 11.7–15.5)
Lymphs Abs: 2627 cells/uL (ref 850–3900)
MCH: 33 pg (ref 27.0–33.0)
MCHC: 34.9 g/dL (ref 32.0–36.0)
MCV: 94.6 fL (ref 80.0–100.0)
MPV: 9.9 fL (ref 7.5–12.5)
Monocytes Relative: 8.2 %
Neutro Abs: 4956 cells/uL (ref 1500–7800)
Neutrophils Relative %: 58.3 %
Platelets: 267 10*3/uL (ref 140–400)
RBC: 4.45 10*6/uL (ref 3.80–5.10)
RDW: 12.7 % (ref 11.0–15.0)
Total Lymphocyte: 30.9 %
WBC: 8.5 10*3/uL (ref 3.8–10.8)

## 2019-12-21 LAB — VITAMIN B12: Vitamin B-12: 361 pg/mL (ref 200–1100)

## 2019-12-21 LAB — HEMOGLOBIN A1C
Hgb A1c MFr Bld: 6 % of total Hgb — ABNORMAL HIGH (ref <5.7)
Mean Plasma Glucose: 126 (calc)
eAG (mmol/L): 7 (calc)

## 2019-12-21 LAB — LIPID PANEL
Cholesterol: 203 mg/dL — ABNORMAL HIGH (ref <200)
HDL: 42 mg/dL — ABNORMAL LOW (ref >=50)
LDL Cholesterol (Calc): 105 mg/dL (calc) — ABNORMAL HIGH
Non-HDL Cholesterol (Calc): 161 mg/dL (calc) — ABNORMAL HIGH (ref <130)
Total CHOL/HDL Ratio: 4.8 (calc) (ref <5.0)
Triglycerides: 389 mg/dL — ABNORMAL HIGH (ref <150)

## 2019-12-21 LAB — VITAMIN D 25 HYDROXY (VIT D DEFICIENCY, FRACTURES): Vit D, 25-Hydroxy: 55 ng/mL (ref 30–100)

## 2019-12-27 ENCOUNTER — Ambulatory Visit: Payer: BC Managed Care – PPO | Admitting: Family Medicine

## 2019-12-31 ENCOUNTER — Ambulatory Visit: Payer: BC Managed Care – PPO | Admitting: Family Medicine

## 2020-01-13 ENCOUNTER — Other Ambulatory Visit: Payer: Self-pay | Admitting: Family Medicine

## 2020-01-13 DIAGNOSIS — G43009 Migraine without aura, not intractable, without status migrainosus: Secondary | ICD-10-CM

## 2020-01-17 DIAGNOSIS — M531 Cervicobrachial syndrome: Secondary | ICD-10-CM | POA: Diagnosis not present

## 2020-01-17 DIAGNOSIS — M4602 Spinal enthesopathy, cervical region: Secondary | ICD-10-CM | POA: Diagnosis not present

## 2020-01-17 DIAGNOSIS — M9901 Segmental and somatic dysfunction of cervical region: Secondary | ICD-10-CM | POA: Diagnosis not present

## 2020-01-17 DIAGNOSIS — M4606 Spinal enthesopathy, lumbar region: Secondary | ICD-10-CM | POA: Diagnosis not present

## 2020-01-20 DIAGNOSIS — M4606 Spinal enthesopathy, lumbar region: Secondary | ICD-10-CM | POA: Diagnosis not present

## 2020-01-20 DIAGNOSIS — M4602 Spinal enthesopathy, cervical region: Secondary | ICD-10-CM | POA: Diagnosis not present

## 2020-01-20 DIAGNOSIS — M531 Cervicobrachial syndrome: Secondary | ICD-10-CM | POA: Diagnosis not present

## 2020-01-20 DIAGNOSIS — M9901 Segmental and somatic dysfunction of cervical region: Secondary | ICD-10-CM | POA: Diagnosis not present

## 2020-01-23 DIAGNOSIS — M4606 Spinal enthesopathy, lumbar region: Secondary | ICD-10-CM | POA: Diagnosis not present

## 2020-01-23 DIAGNOSIS — M531 Cervicobrachial syndrome: Secondary | ICD-10-CM | POA: Diagnosis not present

## 2020-01-23 DIAGNOSIS — M9901 Segmental and somatic dysfunction of cervical region: Secondary | ICD-10-CM | POA: Diagnosis not present

## 2020-01-23 DIAGNOSIS — M4602 Spinal enthesopathy, cervical region: Secondary | ICD-10-CM | POA: Diagnosis not present

## 2020-01-28 DIAGNOSIS — M9901 Segmental and somatic dysfunction of cervical region: Secondary | ICD-10-CM | POA: Diagnosis not present

## 2020-01-28 DIAGNOSIS — M4602 Spinal enthesopathy, cervical region: Secondary | ICD-10-CM | POA: Diagnosis not present

## 2020-01-28 DIAGNOSIS — M4606 Spinal enthesopathy, lumbar region: Secondary | ICD-10-CM | POA: Diagnosis not present

## 2020-01-28 DIAGNOSIS — M531 Cervicobrachial syndrome: Secondary | ICD-10-CM | POA: Diagnosis not present

## 2020-01-30 DIAGNOSIS — M4606 Spinal enthesopathy, lumbar region: Secondary | ICD-10-CM | POA: Diagnosis not present

## 2020-01-30 DIAGNOSIS — M9901 Segmental and somatic dysfunction of cervical region: Secondary | ICD-10-CM | POA: Diagnosis not present

## 2020-01-30 DIAGNOSIS — M4602 Spinal enthesopathy, cervical region: Secondary | ICD-10-CM | POA: Diagnosis not present

## 2020-01-30 DIAGNOSIS — M531 Cervicobrachial syndrome: Secondary | ICD-10-CM | POA: Diagnosis not present

## 2020-02-03 DIAGNOSIS — M4606 Spinal enthesopathy, lumbar region: Secondary | ICD-10-CM | POA: Diagnosis not present

## 2020-02-03 DIAGNOSIS — M9901 Segmental and somatic dysfunction of cervical region: Secondary | ICD-10-CM | POA: Diagnosis not present

## 2020-02-03 DIAGNOSIS — M4602 Spinal enthesopathy, cervical region: Secondary | ICD-10-CM | POA: Diagnosis not present

## 2020-02-03 DIAGNOSIS — M531 Cervicobrachial syndrome: Secondary | ICD-10-CM | POA: Diagnosis not present

## 2020-03-04 ENCOUNTER — Encounter: Payer: Self-pay | Admitting: Family Medicine

## 2020-03-20 ENCOUNTER — Ambulatory Visit (INDEPENDENT_AMBULATORY_CARE_PROVIDER_SITE_OTHER): Payer: BC Managed Care – PPO | Admitting: Family Medicine

## 2020-03-20 ENCOUNTER — Encounter: Payer: Self-pay | Admitting: Family Medicine

## 2020-03-20 VITALS — BP 128/72 | HR 87 | Temp 97.4°F | Ht 67.0 in | Wt 189.9 lb

## 2020-03-20 DIAGNOSIS — E538 Deficiency of other specified B group vitamins: Secondary | ICD-10-CM

## 2020-03-20 DIAGNOSIS — J449 Chronic obstructive pulmonary disease, unspecified: Secondary | ICD-10-CM

## 2020-03-20 DIAGNOSIS — G629 Polyneuropathy, unspecified: Secondary | ICD-10-CM

## 2020-03-20 DIAGNOSIS — E559 Vitamin D deficiency, unspecified: Secondary | ICD-10-CM

## 2020-03-20 DIAGNOSIS — I10 Essential (primary) hypertension: Secondary | ICD-10-CM | POA: Diagnosis not present

## 2020-03-20 DIAGNOSIS — J3089 Other allergic rhinitis: Secondary | ICD-10-CM

## 2020-03-20 DIAGNOSIS — G43009 Migraine without aura, not intractable, without status migrainosus: Secondary | ICD-10-CM

## 2020-03-20 DIAGNOSIS — F411 Generalized anxiety disorder: Secondary | ICD-10-CM | POA: Diagnosis not present

## 2020-03-20 DIAGNOSIS — E785 Hyperlipidemia, unspecified: Secondary | ICD-10-CM

## 2020-03-20 DIAGNOSIS — F320 Major depressive disorder, single episode, mild: Secondary | ICD-10-CM

## 2020-03-20 DIAGNOSIS — E8881 Metabolic syndrome: Secondary | ICD-10-CM

## 2020-03-20 MED ORDER — TRELEGY ELLIPTA 100-62.5-25 MCG/INH IN AEPB: 1.0000 | INHALATION_SPRAY | Freq: Every day | RESPIRATORY_TRACT | 1 refills | Status: DC

## 2020-03-20 MED ORDER — RIZATRIPTAN BENZOATE 10 MG PO TBDP: 10.0000 mg | ORAL_TABLET | ORAL | 1 refills | Status: DC | PRN

## 2020-03-20 MED ORDER — UBRELVY 100 MG PO TABS: 1.0000 | ORAL_TABLET | Freq: Every day | ORAL | 2 refills | Status: DC | PRN

## 2020-03-20 MED ORDER — ALPRAZOLAM 0.5 MG PO TABS: 0.5000 mg | ORAL_TABLET | Freq: Every day | ORAL | 0 refills | Status: DC | PRN

## 2020-03-20 NOTE — Progress Notes (Signed)
Name: Destiny Atkinson   MRN: [id number]    DOB: Mar 23, 1976   Date:03/20/2020       Progress Note  Subjective  Chief Complaint  Chief Complaint  Patient presents with  . Medication Refill  . Asthma    Mainly due to allergies-pollen makes symptoms worse   . Depression  . GAD  . Hypertension    Denies any symptoms   . Migraine    Has been increased-maybe due to pollen    I connected with  [name] on 03/20/20 at  2:00 PM EDT by telephone and verified that I am speaking with the correct person using two identifiers.  I discussed the limitations, risks, security and privacy concerns of performing an evaluation and management service by telephone and the availability of in person appointments. Staff also discussed with the patient that there may be a patient responsible charge related to this service. Patient Location: work  Restaurant manager, fast food Location: [facility]   HPI  Migraine :She has noticed an increase in frequency she was having episodes 3 times a week but last one was about one week ago .  Not associated nausea or vomiting, but is associated with photophobia. Migraine is described as a sharp pain over left eye and radiates to left temporal area and sometimes nuchal area.She takesMaxaltprn and states it works within 30 minutes.We will add Bernita Raisin since she is having more frequent episodes , discussed prevention medication but she states usually not this frequent and wants to hold off on adding another medication   HTN:we stopped Losartan back in June 2019 because of dizziness and is doing well on HCTZ by itself, last bp in our office was at goal, today it was a virtual visit. No chest pain, no edema or dizziness . She is still walking 4 day a week or doing yoga.   GAD/MajorDepression: she left [place] and started a new job at [employer] in [place] back in May 2017, she left [employer] when she walked away from her job, and was unemployed until 06/2017. 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  She is back on Lexapro10 mg daily, oldest daughter is doing better seeing psychiatrist and therapist but now thinks her youngest that is 24 seems anxious and will get her to see a therapist also. She  wakes up at night with panic attacks and takes Xanax prn, 30 pills lasts 3-4 months . She states depression is better because of sunlight but continues to feel very anxious  RLS/Peripheral Neuropathy: She has a need to move her legs when sitting down, also states that at night her legs moves constantly.Numbness had been intermittent on hands and feet. Last B12 was within normal range and seems to control the paresthesia   AR: she his allergic to cats, but has 3 cats at home, also allergic to dogs and has one of them,she does not like  nasal steroid but is taking Xyzal otc  she also has seasonal allergic . She has itchy eyes and nose, also her throat .She states symptoms have been worse lately, likely from high pollen count. She added loratadine at night and is doing better now   Asthma/COPD: she still smokes,still  6cigarettes daily. She states symptoms have improved with Trelegy. She has noticed improvement in cough, sob and wheezing   Dyslipidemia: Triglycerides was high, discussed importance of healthy  diet and increase in physical activity  The 10-year ASCVD risk score Denman George DC Montez Hageman., et al., 2013) is: 5.6%   Values used to calculate the score:     Age: 44 years     Sex: Female     Is Non-Hispanic African American: No     Diabetic: No     Tobacco smoker: Yes     Systolic Blood Pressure: 128 mmHg     Is BP treated: Yes     HDL Cholesterol: 42 mg/dL     Total Cholesterol: 203 mg/dL  Metabolic syndrome: labs hgbA1C was6% She denies polyphagia, polydipsia or polyuria. Reminded her of low carbohydrate diet   Patient Active Problem List   Diagnosis Date Noted   . Pre-diabetes 11/17/2017  . Vitamin D deficiency 12/28/2016  . Mild major depression (HCC) 11/17/2015  . GAD (generalized anxiety disorder) 05/06/2015  . Asthma, moderate persistent 05/06/2015  . Mobitz type I incomplete atrioventricular block 05/06/2015  . B12 deficiency 05/06/2015  . Essential (primary) hypertension 05/06/2015  . Irregular bleeding 05/06/2015  . Migraine without aura and responsive to treatment 05/06/2015  . Lack of erotic interest 05/06/2015  . BMI 31.0-31.9,adult 05/06/2015  . Peripheral neuropathy 05/06/2015  . Perennial allergic rhinitis 05/06/2015  . Restless leg 05/06/2015  . Tobacco abuse 05/06/2015    Past Surgical History:  Procedure Laterality Date  . COLONOSCOPY     in 8th grade, not as adult  . ESOPHAGOGASTRODUODENOSCOPY     age 26  . NASAL SINUS SURGERY  01/21/2010   septoplasty/ sinus surgery  . WISDOM TOOTH EXTRACTION  age 39    Family History  Problem Relation Age of Onset  . COPD Mother   . Fibromyalgia Mother   . Hypertension Mother   . Stroke Mother   . Asthma Mother   . Arthritis Mother   . Depression Mother   . Heart disease Mother   . Crohn's disease Mother   . Diabetes Father   . Epilepsy Brother   . Stomach cancer Paternal Grandmother 1  . Ovarian cancer Other 45  . Ovarian cancer Other 50  . Diabetes type II Daughter   . Bipolar disorder Maternal Grandmother     Social History   Tobacco Use  . Smoking status: Current Every Day Smoker    Packs/day: 0.25    Years: 24.00    Pack years: 6.00    Types: Cigarettes, E-cigarettes    Start date: 06/29/1995  . Smokeless tobacco: Never Used  Substance Use Topics  . Alcohol use: Yes    Alcohol/week: 0.0 standard drinks    Comment: rarely    Current Outpatient Medications:  .  ALPRAZolam (XANAX) 0.5 MG tablet, Take 1 tablet (0.5 mg total) by mouth daily as needed for anxiety., Disp: 30 tablet, Rfl: 0 .  escitalopram (LEXAPRO) 10 MG tablet, TAKE 1 TABLET DAILY, Disp: 90  tablet, Rfl: 3 .  Fluticasone-Umeclidin-Vilant (TRELEGY ELLIPTA) 100-62.5-25 MCG/INH AEPB, Inhale 1 puff into the lungs daily., Disp: 60 each, Rfl: 3 .  hydrochlorothiazide (HYDRODIURIL) 12.5 MG tablet, TAKE 1 TABLET DAILY, Disp: 90 tablet, Rfl: 3 .  hydrOXYzine (ATARAX/VISTARIL) 25 MG tablet, TAKE 1 TABLET AT BEDTIME, Disp: 90 tablet, Rfl: 3 .  ipratropium-albuterol (DUONEB) 0.5-2.5 (3) MG/3ML SOLN, Take 3 mLs by nebulization every 6 (six) hours as needed., Disp: 360 mL, Rfl: 1 .  levocetirizine (XYZAL) 5 MG tablet, Take 1 tablet (5 mg total) by mouth daily., Disp: 90 tablet, Rfl: 1 .  rizatriptan (MAXALT-MLT)  10 MG disintegrating tablet, TAKE ONE TABLET BY MOUTH AS NEEDED FOR MIGRAINE, Disp: 9 tablet, Rfl: 0 .  Vitamin D, Ergocalciferol, (DRISDOL) 1.25 MG (50000 UT) CAPS capsule, TAKE 1 CAPSULE ONCE A WEEK, Disp: 12 capsule, Rfl: 3 .  Levonorgestrel (LILETTA, 52 MG,) 19.5 MCG/DAY IUD IUD, 1 Intra Uterine Device (1 each total) by Intrauterine route once for 1 dose., Disp: 1 each, Rfl: 0  Allergies  Allergen Reactions  . Dog Epithelium     Anaphylaxis with Rabbits.  Dogs, cats, birds cause swelling, extreme itching.  . Dust Mite Extract   . Pollen Extract   . Soap   . Tape   . Tree Extract     I personally reviewed active problem list, medication list, allergies, family history, social history, health maintenance with the patient/caregiver today.   ROS  Ten systems reviewed and is negative except as mentioned in HPI   Objective  Virtual encounter, vitals not obtained.  Body mass index is 29.74 kg/m.  Physical Exam  Awake, alert and oriented   PHQ2/9: Depression screen [facility] 2/9 03/20/2020 12/20/2019 08/26/2019 05/23/2019 02/14/2019  Decreased Interest 0 1 1 0 1  Down, Depressed, Hopeless 1 1 0 0 1  PHQ - 2 Score 1 2 1  0 2  Altered sleeping 1 3 1  0 3  Tired, decreased energy 1 3 1  0 3  Change in appetite 0 2 0 0 3  Feeling bad or failure about yourself  0 0 0 0 1  Trouble  concentrating 0 0 1 0 1  Moving slowly or fidgety/restless 1 0 0 0 3  Suicidal thoughts 0 0 0 0 0  PHQ-9 Score 4 10 4  0 16  Difficult doing work/chores Not difficult at all Somewhat difficult Somewhat difficult Not difficult at all Somewhat difficult  Some recent data might be hidden   PHQ-2/9 Result is positive.    Fall Risk: Fall Risk  03/20/2020 12/20/2019 05/23/2019 02/14/2019 01/15/2019  Falls in the past year? 0 0 0 0 0  Number falls in past yr: 0 0 0 0 0  Injury with Fall? 0 0 0 0 0  Follow up - - - - -     Assessment & Plan  1. GAD (generalized anxiety disorder)  - ALPRAZolam (XANAX) 0.5 MG tablet; Take 1 tablet (0.5 mg total) by mouth daily as needed for anxiety.  Dispense: 30 tablet; Refill: 0  2. COPD with asthma ([place])  - Fluticasone-Umeclidin-Vilant (TRELEGY ELLIPTA) 100-62.5-25 MCG/INH AEPB; Inhale 1 puff into the lungs daily.  Dispense: 180 each; Refill: 1  3. Essential hypertension  Seems to be controlled with medication  4. Dyslipidemia   5. Peripheral polyneuropathy  stable  6. Mild major depression (HCC)  Stable on medication  7. B12 deficiency  At goal, continue supplements   8. Vitamin D deficiency  Continue supplementation   9. Migraine without aura and responsive to treatment  - Ubrogepant (UBRELVY) 100 MG TABS; Take 1 tablet by mouth daily as needed.  Dispense: 30 tablet; Refill: 2 - rizatriptan (MAXALT-MLT) 10 MG disintegrating tablet; Take 1 tablet (10 mg total) by mouth as needed for migraine. May repeat in 2 hours if needed  Dispense: 27 tablet; Refill: 1  10. Metabolic syndrome   11. Perennial allergic rhinitis  Continue anti-histamines   I discussed the assessment and treatment plan with the patient. The patient was provided an opportunity to ask questions and all were answered. The patient agreed with the plan and demonstrated an  understanding of the instructions.   The patient was advised to call back or seek an in-person  evaluation if the symptoms worsen or if the condition fails to improve as anticipated.  I provided 25  minutes of non-face-to-face time during this encounter.  Ruel Favors, MD

## 2020-03-31 ENCOUNTER — Ambulatory Visit: Payer: Self-pay

## 2020-03-31 ENCOUNTER — Encounter: Payer: Self-pay | Admitting: Family Medicine

## 2020-03-31 ENCOUNTER — Ambulatory Visit (INDEPENDENT_AMBULATORY_CARE_PROVIDER_SITE_OTHER): Payer: BC Managed Care – PPO | Admitting: Family Medicine

## 2020-03-31 ENCOUNTER — Other Ambulatory Visit: Payer: Self-pay

## 2020-03-31 VITALS — BP 130/76 | HR 97 | Temp 97.3°F | Resp 16 | Ht 67.0 in | Wt 191.8 lb

## 2020-03-31 DIAGNOSIS — R42 Dizziness and giddiness: Secondary | ICD-10-CM

## 2020-03-31 NOTE — Patient Instructions (Signed)

## 2020-03-31 NOTE — Telephone Encounter (Signed)
Pt. Reports started having dizziness Saturday. Reports she actually fell Saturday due to dizziness. Worse when she turns her head and changes positions. Feels like room is spinning at times. Appointment made for today.  Reason for Disposition . [1] MODERATE dizziness (e.g., interferes with normal activities) AND [2] has NOT been evaluated by physician for this  (Exception: dizziness caused by heat exposure, sudden standing, or poor fluid intake)  Answer Assessment - Initial Assessment Questions 1. DESCRIPTION: "Describe your dizziness."     Lightheaeded 2. LIGHTHEADED: "Do you feel lightheaded?" (e.g., somewhat faint, woozy, weak upon standing)     Moving her head 3. VERTIGO: "Do you feel like either you or the room is spinning or tilting?" (i.e. vertigo)     Sometimes 4. SEVERITY: "How bad is it?"  "Do you feel like you are going to faint?" "Can you stand and walk?"   - MILD - walking normally   - MODERATE - interferes with normal activities (e.g., work, school)    - SEVERE - unable to stand, requires support to walk, feels like passing out now.      Moderate 5. ONSET:  "When did the dizziness begin?"     Saturday 6. AGGRAVATING FACTORS: "Does anything make it worse?" (e.g., standing, change in head position)     Changing positions 7. HEART RATE: "Can you tell me your heart rate?" "How many beats in 15 seconds?"  (Note: not all patients can do this)       No 8. CAUSE: "What do you think is causing the dizziness?"     Unsure 9. RECURRENT SYMPTOM: "Have you had dizziness before?" If so, ask: "When was the last time?" "What happened that time?"     No 10. OTHER SYMPTOMS: "Do you have any other symptoms?" (e.g., fever, chest pain, vomiting, diarrhea, bleeding)       Nausea Saturday 11. PREGNANCY: "Is there any chance you are pregnant?" "When was your last menstrual period?"       No  Protocols used: DIZZINESS [facility]

## 2020-03-31 NOTE — Progress Notes (Signed)
Name: Destiny Atkinson   MRN: [id number]    DOB: [birth year]   Date:03/31/2020       Progress Note  Subjective  Chief Complaint  Chief Complaint  Patient presents with  . Dizziness    x 3 days    HPI  Acute onset of dizziness: she states she got up on Saturday morning and as soon as she stood up she fell forward. No bowel or bladder incontinence, no loss of consciousness and did not hit her head. She had to have assistance getting up. She felt off balance the entire day, had some nausea, no spinning sensation, no vomiting or diarrhea. Denies hearing loss or tinnitus, right ear is popping when she swallows. No weakness or any other neuro deficit. Feeling better but still scared of standing up  - she has been getting up slowly, she is also worried about moving her head laterally. Never had this in the past. She has a history of migraine but no headaches with this episode    Patient Active Problem List   Diagnosis Date Noted  . Pre-diabetes 11/17/2017  . Vitamin D deficiency 12/28/2016  . Mild major depression ([place]) 11/17/2015  . GAD (generalized anxiety disorder) 05/06/2015  . Asthma, moderate persistent 05/06/2015  . Mobitz type I incomplete atrioventricular block 05/06/2015  . B12 deficiency 05/06/2015  . Essential (primary) hypertension 05/06/2015  . Irregular bleeding 05/06/2015  . Migraine without aura and responsive to treatment 05/06/2015  . Lack of erotic interest 05/06/2015  . BMI 31.0-31.9,adult 05/06/2015  . Peripheral neuropathy 05/06/2015  . Perennial allergic rhinitis 05/06/2015  . Restless leg 05/06/2015  . Tobacco abuse 05/06/2015    Past Surgical History:  Procedure Laterality Date  . COLONOSCOPY     in 8th grade, not as adult  . ESOPHAGOGASTRODUODENOSCOPY     age 89  . NASAL SINUS SURGERY  01/21/2010   septoplasty/ sinus surgery  . WISDOM TOOTH EXTRACTION  age 89    Family History  Problem Relation Age of Onset  . COPD Mother   . Fibromyalgia Mother    . Hypertension Mother   . Stroke Mother   . Asthma Mother   . Arthritis Mother   . Depression Mother   . Heart disease Mother   . Crohn's disease Mother   . Diabetes Father   . Epilepsy Brother   . Diabetes type II Daughter   . Stomach cancer Paternal Grandmother 5  . Ovarian cancer Other 51  . Ovarian cancer Other 50  . Diabetes type II Daughter   . Kidney Stones Daughter   . Bipolar disorder Maternal Grandmother     Social History   Tobacco Use  . Smoking status: Current Every Day Smoker    Packs/day: 0.25    Years: 24.00    Pack years: 6.00    Types: Cigarettes, E-cigarettes    Start date: 06/29/1995  . Smokeless tobacco: Never Used  Substance Use Topics  . Alcohol use: Yes    Alcohol/week: 0.0 standard drinks    Comment: rarely     Current Outpatient Medications:  .  ALPRAZolam (XANAX) 0.5 MG tablet, Take 1 tablet (0.5 mg total) by mouth daily as needed for anxiety., Disp: 30 tablet, Rfl: 0 .  escitalopram (LEXAPRO) 10 MG tablet, TAKE 1 TABLET DAILY, Disp: 90 tablet, Rfl: 3 .  Fluticasone-Umeclidin-Vilant (TRELEGY ELLIPTA) 100-62.5-25 MCG/INH AEPB, Inhale 1 puff into the lungs daily., Disp: 180 each, Rfl: 1 .  hydrochlorothiazide (HYDRODIURIL) 12.5 MG tablet, TAKE 1  TABLET DAILY, Disp: 90 tablet, Rfl: 3 .  hydrOXYzine (ATARAX/VISTARIL) 25 MG tablet, TAKE 1 TABLET AT BEDTIME, Disp: 90 tablet, Rfl: 3 .  ipratropium-albuterol (DUONEB) 0.5-2.5 (3) MG/3ML SOLN, Take 3 mLs by nebulization every 6 (six) hours as needed., Disp: 360 mL, Rfl: 1 .  levocetirizine (XYZAL) 5 MG tablet, Take 1 tablet (5 mg total) by mouth daily., Disp: 90 tablet, Rfl: 1 .  loratadine (CLARITIN) 10 MG tablet, Take 10 mg by mouth daily., Disp: , Rfl:  .  rizatriptan (MAXALT-MLT) 10 MG disintegrating tablet, Take 1 tablet (10 mg total) by mouth as needed for migraine. May repeat in 2 hours if needed, Disp: 27 tablet, Rfl: 1 .  Ubrogepant (UBRELVY) 100 MG TABS, Take 1 tablet by mouth daily as needed.,  Disp: 30 tablet, Rfl: 2 .  Vitamin D, Ergocalciferol, (DRISDOL) 1.25 MG (50000 UT) CAPS capsule, TAKE 1 CAPSULE ONCE A WEEK, Disp: 12 capsule, Rfl: 3 .  Levonorgestrel (LILETTA, 52 MG,) 19.5 MCG/DAY IUD IUD, 1 Intra Uterine Device (1 each total) by Intrauterine route once for 1 dose., Disp: 1 each, Rfl: 0  Allergies  Allergen Reactions  . Dog Epithelium     Anaphylaxis with Rabbits.  Dogs, cats, birds cause swelling, extreme itching.  . Dust Mite Extract   . Pollen Extract   . Soap   . Tape   . Tree Extract     I personally reviewed active problem list, medication list, allergies, family history, social history, health maintenance with the patient/caregiver today.   ROS  Constitutional: Negative for fever or weight change.  Respiratory: Negative for cough and shortness of breath.   Cardiovascular: Negative for chest pain or palpitations.  Gastrointestinal: Negative for abdominal pain, no bowel changes.  Musculoskeletal: Negative for gait problem or joint swelling.  Skin: Negative for rash.  Neurological: positive for dizziness but no headache.  No other specific complaints in a complete review of systems (except as listed in HPI above).  Objective  Vitals:   03/31/20 1052  BP: 130/76  Pulse: 97  Resp: 16  Temp: (!) 97.3 F (36.3 C)  TempSrc: Temporal  SpO2: 96%  Weight: 191 lb 12.8 oz (87 kg)  Height: 5\' 7"  (1.702 m)    Body mass index is 30.04 kg/m.  Physical Exam  Constitutional: Patient appears well-developed and well-nourished. Obese  No distress.  HEENT: head atraumatic, normocephalic, pupils equal and reactive to light, ears normal TM neck supple, throat within normal limits Cardiovascular: Normal rate, regular rhythm and normal heart sounds.  No murmur heard. No BLE edema. Pulmonary/Chest: Effort normal and breath sounds normal. No respiratory distress. Abdominal: Soft.  There is no tenderness. Psychiatric: Patient has a normal mood and affect. behavior is  normal. Judgment and thought content normal. Neurological: no focal deficit, no nystagmus, romberg negative.slow tandem walk but able to do it.  PHQ2/9: Depression screen [facility] 2/9 03/31/2020 03/20/2020 12/20/2019 08/26/2019 05/23/2019  Decreased Interest 0 0 1 1 0  Down, Depressed, Hopeless 1 1 1  0 0  PHQ - 2 Score 1 1 2 1  0  Altered sleeping 1 1 3 1  0  Tired, decreased energy 1 1 3 1  0  Change in appetite 0 0 2 0 0  Feeling bad or failure about yourself  1 0 0 0 0  Trouble concentrating 0 0 0 1 0  Moving slowly or fidgety/restless 0 1 0 0 0  Suicidal thoughts 0 0 0 0 0  PHQ-9 Score 4 4 10  4  0  Difficult doing work/chores - Not difficult at all Somewhat difficult Somewhat difficult Not difficult at all  Some recent data might be hidden    phq 9 is positive  Fall Risk: Fall Risk  03/31/2020 03/20/2020 12/20/2019 05/23/2019 02/14/2019  Falls in the past year? 1 0 0 0 0  Number falls in past yr: 0 0 0 0 0  Injury with Fall? 0 0 0 0 0  Follow up - - - - -    Assessment & Plan  1. Dizziness  - Ambulatory referral to ENT

## 2020-04-01 ENCOUNTER — Other Ambulatory Visit: Payer: Self-pay | Admitting: Family Medicine

## 2020-04-01 DIAGNOSIS — G43009 Migraine without aura, not intractable, without status migrainosus: Secondary | ICD-10-CM

## 2020-04-01 MED ORDER — UBRELVY 100 MG PO TABS: 1.0000 | ORAL_TABLET | Freq: Every day | ORAL | 2 refills | Status: DC | PRN

## 2020-04-02 DIAGNOSIS — H8112 Benign paroxysmal vertigo, left ear: Secondary | ICD-10-CM | POA: Diagnosis not present

## 2020-06-12 ENCOUNTER — Other Ambulatory Visit: Payer: Self-pay | Admitting: Family Medicine

## 2020-06-12 DIAGNOSIS — E559 Vitamin D deficiency, unspecified: Secondary | ICD-10-CM

## 2020-06-23 ENCOUNTER — Ambulatory Visit (INDEPENDENT_AMBULATORY_CARE_PROVIDER_SITE_OTHER): Payer: BC Managed Care – PPO | Admitting: Family Medicine

## 2020-06-23 ENCOUNTER — Other Ambulatory Visit: Payer: Self-pay

## 2020-06-23 ENCOUNTER — Encounter: Payer: Self-pay | Admitting: Family Medicine

## 2020-06-23 VITALS — BP 122/76 | HR 95 | Temp 97.9°F | Resp 16 | Ht 66.5 in | Wt 188.9 lb

## 2020-06-23 DIAGNOSIS — E559 Vitamin D deficiency, unspecified: Secondary | ICD-10-CM

## 2020-06-23 DIAGNOSIS — F411 Generalized anxiety disorder: Secondary | ICD-10-CM

## 2020-06-23 DIAGNOSIS — G43009 Migraine without aura, not intractable, without status migrainosus: Secondary | ICD-10-CM

## 2020-06-23 DIAGNOSIS — E8881 Metabolic syndrome: Secondary | ICD-10-CM

## 2020-06-23 DIAGNOSIS — G629 Polyneuropathy, unspecified: Secondary | ICD-10-CM

## 2020-06-23 DIAGNOSIS — J449 Chronic obstructive pulmonary disease, unspecified: Secondary | ICD-10-CM | POA: Diagnosis not present

## 2020-06-23 DIAGNOSIS — Z1159 Encounter for screening for other viral diseases: Secondary | ICD-10-CM

## 2020-06-23 DIAGNOSIS — E785 Hyperlipidemia, unspecified: Secondary | ICD-10-CM

## 2020-06-23 DIAGNOSIS — E538 Deficiency of other specified B group vitamins: Secondary | ICD-10-CM

## 2020-06-23 DIAGNOSIS — I10 Essential (primary) hypertension: Secondary | ICD-10-CM | POA: Diagnosis not present

## 2020-06-23 DIAGNOSIS — J4489 Other specified chronic obstructive pulmonary disease: Secondary | ICD-10-CM

## 2020-06-23 MED ORDER — HYDROXYZINE HCL 25 MG PO TABS: 25.0000 mg | ORAL_TABLET | Freq: Every day | ORAL | 3 refills | Status: DC

## 2020-06-23 MED ORDER — PRAMIPEXOLE DIHYDROCHLORIDE 0.25 MG PO TABS: 0.2500 mg | ORAL_TABLET | Freq: Every evening | ORAL | 0 refills | Status: DC

## 2020-06-23 MED ORDER — HYDROCHLOROTHIAZIDE 12.5 MG PO TABS: 12.5000 mg | ORAL_TABLET | Freq: Every day | ORAL | 3 refills | Status: DC

## 2020-06-23 MED ORDER — ALPRAZOLAM 0.5 MG PO TABS: 0.5000 mg | ORAL_TABLET | Freq: Every day | ORAL | 0 refills | Status: DC | PRN

## 2020-06-23 NOTE — Progress Notes (Signed)
Name: Destiny Atkinson   MRN: [id number]    DOB: 01/21/1976   Date:06/23/2020       Progress Note  Subjective  Chief Complaint  Chief Complaint  Patient presents with   Follow-up    Dizziness    HPI   Migraine :episodes not as frequent again down to at most once a week.Not associated nausea or vomiting, but is associated with photophobia. Migraine is described as a sharp pain over left eye and radiates to left temporal area and sometimes nuchal area.She takesMaxaltprn and states it works within 30 minutes.We added Bernita Raisin and is doing well on prn medication   HTN:we stopped Losartan back in June 2019 because of dizziness and is doing well on HCTZ by itself, last bp in our office was at [facility] chest pain, no edema or dizziness . She is still walking 4 day a week or doing yoga.  GAD/MajorDepression: she left [employer] and started a new job at [employer] in [place] back in May 2017, she left [employer] 2018 when she walked away from her job, and was unemployed until 06/2017. 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 Recently had to move and it was very stressful, youngest daughter had surgery , she had to take more xanax, but last rx was given April and she is due for refill. She has to take lay off weeks because of lack of parts and since they moved to [place] rent is higher.   RLS/Peripheral Neuropathy: She has a need to move her legs when sitting down, also states that at night her legs moves constantly.Numbness had been intermittent on hands and feet. Last B12 was within normal range and seems to control the paresthesia She tried gabapentin and did not work, requip only helped for about 6 months, we will try Mirapex start at 0.25 and go up slowly max of 1  mg at night let me know the dose that works   AR: she his allergic to cats, but has 3 cats at home, also allergic to dogs and has one of them,. She has itchy eyes and nose, also her throat .She has been taking loratadine and xyzal daily off singulair because it stopped working, still has nasal spray at home   Asthma/COPD: she still smokes,still  6cigarettes daily. She states symptoms have improved with Trelegy. She states has mild morning cough, occasional wheezing, but denies sob, states feeling well   Dyslipidemia:Triglycerides was high, discussed importance of healthy diet and increase in physical activity, we will recheck fasting labs, she needs to return for follow up   The 10-year ASCVD risk score Denman George DC Montez Hageman., et al., 2013) is: 5.1%   Values used to calculate the score:     Age: 44 years     Sex: Female     Is Non-Hispanic African American: No     Diabetic: No     Tobacco smoker: Yes     Systolic Blood Pressure: 122 mmHg     Is BP treated: Yes     HDL Cholesterol: 42 mg/dL     Total Cholesterol: 203 mg/dL  Metabolic syndrome: labs hgbA1C was6%She denies polyphagia, polydipsia or polyuria. Reminded her of low carbohydrate diet , also to exercise more. Her youngest daughter and father have DM  BPPV: seen  by ENT and symptoms of dizziness has resolved  Patient Active Problem List   Diagnosis Date Noted   Pre-diabetes 11/17/2017   Vitamin D deficiency 12/28/2016   Mild major depression (HCC) 11/17/2015   GAD (generalized anxiety disorder) 05/06/2015   Asthma, moderate persistent 05/06/2015   Mobitz type I incomplete atrioventricular block 05/06/2015   B12 deficiency 05/06/2015   Essential (primary) hypertension 05/06/2015   Irregular bleeding 05/06/2015   Migraine without aura and responsive to treatment 05/06/2015   Lack of erotic interest 05/06/2015   BMI 31.0-31.9,adult 05/06/2015   Peripheral neuropathy 05/06/2015   Perennial allergic rhinitis  05/06/2015   Restless leg 05/06/2015   Tobacco abuse 05/06/2015    Past Surgical History:  Procedure Laterality Date   COLONOSCOPY     in 8th grade, not as adult   ESOPHAGOGASTRODUODENOSCOPY     age 29   NASAL SINUS SURGERY  01/21/2010   septoplasty/ sinus surgery   WISDOM TOOTH EXTRACTION  age 14    Family History  Problem Relation Age of Onset   COPD Mother    Fibromyalgia Mother    Hypertension Mother    Stroke Mother    Asthma Mother    Arthritis Mother    Depression Mother    Heart disease Mother    Crohn's disease Mother    Diabetes Father    Epilepsy Brother    Diabetes type II Daughter    Stomach cancer Paternal Grandmother 61   Ovarian cancer Other 45   Ovarian cancer Other 50   Diabetes type II Daughter    Kidney Stones Daughter    Bipolar disorder Maternal Grandmother     Social History   Tobacco Use   Smoking status: Current Every Day Smoker    Packs/day: 0.25    Years: 24.00    Pack years: 6.00    Types: Cigarettes, E-cigarettes    Start date: 06/29/1995   Smokeless tobacco: Never Used  Substance Use Topics   Alcohol use: Yes    Alcohol/week: 0.0 standard drinks    Comment: rarely     Current Outpatient Medications:    ALPRAZolam (XANAX) 0.5 MG tablet, Take 1 tablet (0.5 mg total) by mouth daily as needed for anxiety., Disp: 30 tablet, Rfl: 0   escitalopram (LEXAPRO) 10 MG tablet, TAKE 1 TABLET DAILY, Disp: 90 tablet, Rfl: 3   Fluticasone-Umeclidin-Vilant (TRELEGY ELLIPTA) 100-62.5-25 MCG/INH AEPB, Inhale 1 puff into the lungs daily., Disp: 180 each, Rfl: 1   hydrochlorothiazide (HYDRODIURIL) 12.5 MG tablet, TAKE 1 TABLET DAILY, Disp: 90 tablet, Rfl: 3   hydrOXYzine (ATARAX/VISTARIL) 25 MG tablet, TAKE 1 TABLET AT BEDTIME, Disp: 90 tablet, Rfl: 3   ipratropium-albuterol (DUONEB) 0.5-2.5 (3) MG/3ML SOLN, Take 3 mLs by nebulization every 6 (six) hours as needed., Disp: 360 mL, Rfl: 1   levocetirizine (XYZAL) 5 MG  tablet, Take 1 tablet (5 mg total) by mouth daily., Disp: 90 tablet, Rfl: 1   loratadine (CLARITIN) 10 MG tablet, Take 10 mg by mouth daily., Disp: , Rfl:    rizatriptan (MAXALT-MLT) 10 MG disintegrating tablet, Take 1 tablet (10 mg total) by mouth as needed for migraine. May repeat in 2 hours if needed, Disp: 27 tablet, Rfl: 1   Ubrogepant (UBRELVY) 100 MG TABS, Take 1 tablet by mouth daily as needed., Disp: 10 tablet, Rfl: 2   Vitamin D, Ergocalciferol, (DRISDOL) 1.25 MG (50000 UNIT) CAPS capsule, TAKE 1 CAPSULE ONCE A WEEK, Disp: 12 capsule, Rfl: 2   Levonorgestrel (LILETTA, 52  MG,) 19.5 MCG/DAY IUD IUD, 1 Intra Uterine Device (1 each total) by Intrauterine route once for 1 dose., Disp: 1 each, Rfl: 0  Allergies  Allergen Reactions   Dog Epithelium     Anaphylaxis with Rabbits.  Dogs, cats, birds cause swelling, extreme itching.   Dust Mite Extract    Pollen Extract    Soap    Tape    Tree Extract     I personally reviewed active problem list, medication list, allergies, family history, social history, health maintenance with the patient/caregiver today.   ROS  Constitutional: Negative for fever or weight change.  Respiratory: positive for cough , but no  shortness of breath.   Cardiovascular: Negative for chest pain or palpitations.  Gastrointestinal: Negative for abdominal pain, no bowel changes.  Musculoskeletal: Negative for gait problem or joint swelling.  Skin: Negative for rash.  Neurological: Negative for dizziness , positive for  headache.  No other specific complaints in a complete review of systems (except as listed in HPI above).  Objective  Vitals:   06/23/20 1447  BP: 122/76  Pulse: 95  Resp: 16  Temp: 97.9 F (36.6 C)  TempSrc: Temporal  SpO2: 98%  Weight: 188 lb 14.4 oz (85.7 kg)  Height: 5' 6.5" (1.689 m)    Body mass index is 30.03 kg/m.  Physical Exam  Constitutional: Patient appears well-developed and well-nourished. Obese  No  distress.  HEENT: head atraumatic, normocephalic, pupils equal and reactive to light, neck supple, throat within normal limits Cardiovascular: Normal rate, regular rhythm and normal heart sounds.  No murmur heard. No BLE edema. Pulmonary/Chest: Effort normal and breath sounds normal. No respiratory distress. Abdominal: Soft.  There is no tenderness. Psychiatric: Patient has a normal mood and affect. behavior is normal. Judgment and thought content normal.  PHQ2/9: Depression screen [facility] 2/9 06/23/2020 03/31/2020 03/20/2020 12/20/2019 08/26/2019  Decreased Interest 1 0 0 1 1  Down, Depressed, Hopeless 1 1 1 1  0  PHQ - 2 Score 2 1 1 2 1   Altered sleeping 2 1 1 3 1   Tired, decreased energy 3 1 1 3 1   Change in appetite 2 0 0 2 0  Feeling bad or failure about yourself  1 1 0 0 0  Trouble concentrating 1 0 0 0 1  Moving slowly or fidgety/restless 0 0 1 0 0  Suicidal thoughts 0 0 0 0 0  PHQ-9 Score 11 4 4 10 4   Difficult doing work/chores Somewhat difficult - Not difficult at all Somewhat difficult Somewhat difficult  Some recent data might be hidden    phq 9 is positive  Fall Risk: Fall Risk  06/23/2020 03/31/2020 03/20/2020 12/20/2019 05/23/2019  Falls in the past year? 0 1 0 0 0  Number falls in past yr: 0 0 0 0 0  Injury with Fall? 0 0 0 0 0  Follow up - - - - -     Functional Status Survey: Is the patient deaf or have difficulty hearing?: No Does the patient have difficulty seeing, even when wearing glasses/contacts?: No Does the patient have difficulty concentrating, remembering, or making decisions?: No Does the patient have difficulty walking or climbing stairs?: No Does the patient have difficulty dressing or bathing?: No Does the patient have difficulty doing errands alone such as visiting a doctor's office or shopping?: No   Assessment & Plan   1. GAD (generalized anxiety disorder)  - ALPRAZolam (XANAX) 0.5 MG tablet; Take 1 tablet (0.5 mg total) by mouth  daily as needed for  anxiety.  Dispense: 30 tablet; Refill: 0 - hydrOXYzine (ATARAX/VISTARIL) 25 MG tablet; Take 1 tablet (25 mg total) by mouth at bedtime.  Dispense: 90 tablet; Refill: 3  2. Essential hypertension  - hydrochlorothiazide (HYDRODIURIL) 12.5 MG tablet; Take 1 tablet (12.5 mg total) by mouth daily.  Dispense: 90 tablet; Refill: 3 - Comprehensive metabolic panel  3. COPD with asthma (HCC)  Using inhaler as prescribed  4. Migraine without aura and responsive to treatment  Doing better   5. B12 deficiency  Continue supplementation   6. Vitamin D deficiency   7. Metabolic syndrome  - Lipid panel - Comprehensive metabolic panel - Hemoglobin A1c  8. Dyslipidemia  - Lipid panel  9. Peripheral polyneuropathy

## 2020-08-22 ENCOUNTER — Other Ambulatory Visit: Payer: Self-pay | Admitting: Family Medicine

## 2020-08-22 DIAGNOSIS — F411 Generalized anxiety disorder: Secondary | ICD-10-CM

## 2020-08-22 NOTE — Telephone Encounter (Signed)
Requested Prescriptions  Pending Prescriptions Disp Refills  . pramipexole (MIRAPEX) 0.25 MG tablet [Pharmacy Med Name: PRAMIPEXOLE DIHYDROCHLORIDE TABS 0.25MG ] 90 tablet 3    Sig: TAKE 1 TO 2 TABLETS AT BEDTIME     Neurology:  Parkinsonian Agents Passed - 08/22/2020  8:52 AM      Passed - Last BP in normal range    BP Readings from Last 1 Encounters:  06/23/20 122/76         Passed - Valid encounter within last 12 months    Recent Outpatient Visits          2 months ago COPD with asthma [facility])   [facility] Alba Cory, MD   4 months ago Dizziness   [facility] Alba Cory, MD   5 months ago GAD (generalized anxiety disorder)   [facility]ba Cory, MD   8 months ago GAD (generalized anxiety disorder)   [facility] Alba Cory, MD   12 months ago GAD (generalized anxiety disorder)   [facility] Alba Cory, MD      Future Appointments            In 2 months Carlynn Purl, Danna Hefty, MD [facility]           . hydrOXYzine (ATARAX/VISTARIL) 25 MG tablet [Pharmacy Med Name: HYDROXYZINE HCL TABS 25MG ] 90 tablet     Sig: TAKE 1 TABLET AT BEDTIME     Ear, Nose, and Throat:  Antihistamines Passed - 08/22/2020  8:52 AM      Passed - Valid encounter within last 12 months    Recent Outpatient Visits          2 months ago COPD with asthma [facility])   [facility], MD   4 months ago Dizziness   [facility], MD   5 months ago GAD (generalized anxiety disorder)   [facility], MD   8 months ago GAD (generalized anxiety disorder)   [facility], MD   12 months ago GAD (generalized anxiety disorder)   [facility], MD      Future Appointments            In 2 months Alba Cory,  Carlynn Purl, MD [facility]

## 2020-08-31 ENCOUNTER — Other Ambulatory Visit: Payer: Self-pay

## 2020-08-31 DIAGNOSIS — J449 Chronic obstructive pulmonary disease, unspecified: Secondary | ICD-10-CM

## 2020-08-31 MED ORDER — TRELEGY ELLIPTA 100-62.5-25 MCG/INH IN AEPB: 1.0000 | INHALATION_SPRAY | Freq: Every day | RESPIRATORY_TRACT | 1 refills | Status: DC

## 2020-09-08 ENCOUNTER — Other Ambulatory Visit: Payer: Self-pay

## 2020-09-08 DIAGNOSIS — J449 Chronic obstructive pulmonary disease, unspecified: Secondary | ICD-10-CM

## 2020-09-08 MED ORDER — TRELEGY ELLIPTA 100-62.5-25 MCG/INH IN AEPB: 1.0000 | INHALATION_SPRAY | Freq: Every day | RESPIRATORY_TRACT | 1 refills | Status: DC

## 2020-09-10 ENCOUNTER — Other Ambulatory Visit: Payer: Self-pay

## 2020-09-10 ENCOUNTER — Telehealth: Payer: Self-pay | Admitting: Family Medicine

## 2020-09-10 DIAGNOSIS — F411 Generalized anxiety disorder: Secondary | ICD-10-CM

## 2020-09-10 MED ORDER — HYDROXYZINE HCL 25 MG PO TABS: 25.0000 mg | ORAL_TABLET | Freq: Every day | ORAL | 0 refills | Status: DC

## 2020-09-10 NOTE — Telephone Encounter (Signed)
Patient called to ask the doctor if her medication for hydrOXYzine (ATARAX/VISTARIL) 25 MG tablet was cancelled.  She stated that the pharmacy told her that she would need a new script before it could be filled.  Patient stated she was not told that from the doctor.  Please advise and call patient to discuss at [phone]

## 2020-09-15 ENCOUNTER — Telehealth: Payer: Self-pay

## 2020-09-15 DIAGNOSIS — J449 Chronic obstructive pulmonary disease, unspecified: Secondary | ICD-10-CM

## 2020-09-15 MED ORDER — TRELEGY ELLIPTA 100-62.5-25 MCG/INH IN AEPB: 1.0000 | INHALATION_SPRAY | Freq: Every day | RESPIRATORY_TRACT | 1 refills | Status: DC

## 2020-09-16 NOTE — Telephone Encounter (Signed)
lvm for pt to return call to schedule appt. Dr Carlynn Purl did send in refill

## 2020-09-17 ENCOUNTER — Telehealth: Payer: Self-pay

## 2020-09-17 DIAGNOSIS — J449 Chronic obstructive pulmonary disease, unspecified: Secondary | ICD-10-CM

## 2020-09-17 DIAGNOSIS — J4489 Other specified chronic obstructive pulmonary disease: Secondary | ICD-10-CM

## 2020-09-18 MED ORDER — TRELEGY ELLIPTA 100-62.5-25 MCG/INH IN AEPB: 1.0000 | INHALATION_SPRAY | Freq: Every day | RESPIRATORY_TRACT | 1 refills | Status: DC

## 2020-09-22 NOTE — Telephone Encounter (Signed)
Pt is scheduled for Dec 

## 2020-09-28 DIAGNOSIS — M545 Low back pain, unspecified: Secondary | ICD-10-CM | POA: Diagnosis not present

## 2020-09-28 DIAGNOSIS — M9903 Segmental and somatic dysfunction of lumbar region: Secondary | ICD-10-CM | POA: Diagnosis not present

## 2020-10-21 ENCOUNTER — Ambulatory Visit: Payer: BC Managed Care – PPO | Admitting: Family Medicine

## 2020-10-23 ENCOUNTER — Other Ambulatory Visit: Payer: Self-pay | Admitting: Family Medicine

## 2020-10-23 DIAGNOSIS — F411 Generalized anxiety disorder: Secondary | ICD-10-CM

## 2020-10-23 NOTE — Telephone Encounter (Signed)
Requested medication (s) are due for refill today: Yes  Requested medication (s) are on the active medication list: Yes  Last refill:  06/23/20  Future visit scheduled: Yes  Notes to clinic:  Unable to refill per protocol, cannot delegate     Requested Prescriptions  Pending Prescriptions Disp Refills   ALPRAZolam (XANAX) 0.5 MG tablet [Pharmacy Med Name: ALPRAZolam 0.5 MG TABLET] 30 tablet     Sig: TAKE ONE TABLET BY MOUTH DAILY AS NEEDED FOR ANXIETY      Not Delegated - Psychiatry:  Anxiolytics/Hypnotics Failed - 10/23/2020  6:01 PM      Failed - This refill cannot be delegated      Failed - Urine Drug Screen completed in last 360 days      Passed - Valid encounter within last 6 months    Recent Outpatient Visits           4 months ago COPD with asthma [facility])   [facility], MD   6 months ago Dizziness   [facility] Cory, MD   7 months ago GAD (generalized anxiety disorder)   [facility] Al[facility] Cory, MD   10 months ago GAD (generalized anxiety disorder)   [facility] Alba Cory, MD   1 year ago GAD (generalized anxiety disorder)   [facility] Alba Cory, MD       Future Appointments             In 6 days Alba Cory, MD [facility]

## 2020-10-26 ENCOUNTER — Other Ambulatory Visit: Payer: Self-pay

## 2020-10-28 NOTE — Progress Notes (Signed)
Name: Destiny Atkinson   MRN: [id number]    DOB: 02-23-1976   Date:10/29/2020       Progress Note  Subjective  Chief Complaint  Follow up  HPI  Migraine :episodes not as frequent again down now to at most twice a month .Not associated nausea or vomiting, but is associated with photophobia. Migraine is described as a sharp pain over left eye and radiates to left temporal area and sometimes nuchal area.She takesMaxaltprn and states it works within 30 minutes.We added Bernita Raisin and is doing well on prn medication   HTN:we stopped Losartan back in June 2019 because of dizziness and is doing well on HCTZ by itself, last bp in our office was at goal. No chest pain, no edema or dizziness . Unchanged   GAD/MajorDepression: she left [place] and started a new job at [employer] in [place] back in May 2017, she left [employer] 2018 when she walked away from her job, and was unemployed until 06/2017. 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 She has also noticed some problems falling asleep lately    RLS/Peripheral Neuropathy: She has a need to move her legs when sitting down, also states that at night her legs moves constantly.Numbness had been intermittent on hands and feet. Last B12 was within normal range and seems to control the paresthesia She tried gabapentin and did not work, requip only helped for about 6 months, she is now on Mirapex 0.25 mg when she goes up on the dose she has severe cramps, symptoms better with  low dose medication    AR: she his allergic to cats, but has 3 cats at home, also allergic to dogs and has one of them,. She has itchy eyes and nose, also her throat .She has been taking loratadine, xyzal, flonase l daily off singulair because it stopped working  Asthma/COPD: she still smokes,still  6-7cigarettes daily. She states symptoms have improved with Trelegy. She states has mild morning cough, occasional wheezing, she has intermittent sob - usually at night   Dyslipidemia:Triglycerides was high, discussed importance of healthy diet and increase in physical activity, we will recheck fasting labs, she will have labs today but had a muffin a few hours ago   The 10-year ASCVD risk score Denman George DC Montez Hageman., et al., 2013) is: 4.8%   Values used to calculate the score:     Age: 3 years     Sex: Female     Is Non-Hispanic African American: No     Diabetic: No     Tobacco smoker: Yes     Systolic Blood Pressure: 118 mmHg     Is BP treated: Yes     HDL Cholesterol: 42 mg/dL     Total Cholesterol: 203 mg/dL   Metabolic syndrome: labs hgbA1C was6%She denies polyphagia, polydipsia or polyuria. Reminded her of low carbohydrate diet , also to exercise more. Her youngest daughter and father have DM, she is due for labs   BPPV: seen by ENT and symptoms  of dizziness has resolved Unchanged    Patient Active Problem List   Diagnosis Date Noted  . Pre-diabetes 11/17/2017  . Vitamin D deficiency 12/28/2016  . Mild major depression (HCC) 11/17/2015  . GAD (generalized anxiety disorder) 05/06/2015  . Asthma, moderate persistent 05/06/2015  . Mobitz type I incomplete atrioventricular block 05/06/2015  . B12 deficiency 05/06/2015  . Essential (primary) hypertension 05/06/2015  . Irregular bleeding 05/06/2015  . Migraine without aura and responsive to treatment 05/06/2015  . Lack of erotic interest 05/06/2015  . BMI 31.0-31.9,adult 05/06/2015  . Peripheral neuropathy 05/06/2015  .  Perennial allergic rhinitis 05/06/2015  . Restless leg 05/06/2015  . Tobacco abuse 05/06/2015    Past Surgical History:  Procedure Laterality Date  . COLONOSCOPY     in 8th grade, not as adult  . ESOPHAGOGASTRODUODENOSCOPY     age 44  . NASAL SINUS SURGERY  01/21/2010   septoplasty/ sinus surgery  . WISDOM TOOTH EXTRACTION  age 44    Family History  Problem Relation Age of Onset  . COPD Mother   . Fibromyalgia Mother   . Hypertension Mother   . Stroke Mother   . Asthma Mother   . Arthritis Mother   . Depression Mother   . Heart disease Mother   . Crohn's disease Mother   . Diabetes Father   . Epilepsy Brother   . Diabetes type II Daughter   . Stomach cancer Paternal Grandmother [id number]  . Ovarian cancer Other 45  . Ovarian cancer Other 50  . Diabetes type II Daughter   . Kidney Stones Daughter   . Bipolar disorder Maternal Grandmother     Social History   Tobacco Use  . Smoking status: Current Every Day Smoker    Packs/day: 0.25    Years: 24.00    Pack years: 6.00    Types: Cigarettes, E-cigarettes    Start date: 06/29/1995  . Smokeless tobacco: Never Used  Substance Use Topics  . Alcohol use: Yes    Alcohol/week: 0.0 standard drinks    Comment: rarely     Current Outpatient Medications:  .  ALPRAZolam (XANAX) 0.5 MG tablet, Take 1 tablet (0.5 mg total) by mouth daily as needed for anxiety., Disp: 30 tablet, Rfl: 0 .  escitalopram (LEXAPRO) 10 MG tablet, TAKE 1 TABLET DAILY, Disp: 90 tablet, Rfl: 3 .  Fluticasone-Umeclidin-Vilant (TRELEGY ELLIPTA) 100-62.5-25 MCG/INH AEPB, Inhale 1 puff into the lungs daily., Disp: 180 each, Rfl: 1 .  hydrochlorothiazide (HYDRODIURIL) 12.5 MG tablet, Take 1 tablet (12.5 mg total) by mouth daily., Disp: 90 tablet, Rfl: 3 .  hydrOXYzine (ATARAX/VISTARIL) 25 MG tablet, Take 1 tablet (25 mg total) by mouth at bedtime., Disp: 90 tablet, Rfl: 0 .  ipratropium-albuterol (DUONEB) 0.5-2.5 (3) MG/3ML SOLN, Take 3 mLs by nebulization every  6 (six) hours as needed., Disp: 360 mL, Rfl: 1 .  levocetirizine (XYZAL) 5 MG tablet, Take 1 tablet (5 mg total) by mouth daily., Disp: 90 tablet, Rfl: 1 .  loratadine (CLARITIN) 10 MG tablet, Take 10 mg by mouth daily., Disp: , Rfl:  .  pramipexole (MIRAPEX) 0.25 MG tablet, TAKE 1 TO 2 TABLETS AT BEDTIME, Disp: 90 tablet, Rfl: 3 .  rizatriptan (MAXALT-MLT) 10 MG disintegrating tablet, Take 1 tablet (10 mg total) by mouth as needed for migraine. May repeat in 2 hours if needed, Disp: 27 tablet, Rfl: 1 .  Ubrogepant (UBRELVY) 100 MG TABS, Take 1 tablet by mouth daily as needed., Disp: 10 tablet, Rfl: 2 .  Vitamin D, Ergocalciferol, (DRISDOL) 1.25 MG (50000 UNIT) CAPS capsule, TAKE 1 CAPSULE ONCE A WEEK, Disp: 12 capsule, Rfl: 2 .  Levonorgestrel (LILETTA, 52 MG,) 19.5 MCG/DAY IUD IUD, 1 Intra Uterine Device (1 each total) by Intrauterine route once for 1 dose., Disp: 1 each, Rfl: 0  Allergies  Allergen Reactions  . Dog Epithelium     Anaphylaxis with Rabbits.  Dogs, cats, birds cause swelling, extreme itching.  . Dust Mite Extract   . Pollen Extract   . Soap   . Tape   . Tree Extract     I personally reviewed active problem list, medication list, allergies, family history, social history, health maintenance, notes from last encounter with the patient/caregiver today.   ROS  Constitutional: Negative for fever or weight change.  Respiratory: Negative for cough and shortness of breath.   Cardiovascular: Negative for chest pain or palpitations.  Gastrointestinal: Negative for abdominal pain, no bowel changes.  Musculoskeletal: Negative for gait problem or joint swelling.  Skin: Negative for rash.  Neurological: Negative for dizziness or headache.  No other specific complaints in a complete review of systems (except as listed in HPI above).  Objective  Vitals:   10/29/20 1131  BP: 118/72  Pulse: 100  Resp: 18  Temp: 98 F (36.7 C)  TempSrc: Oral  SpO2: 95%  Weight: 197 lb 8 oz  (89.6 kg)  Height: 5\' 7"  (1.702 m)    Body mass index is 30.93 kg/m.  Physical Exam  Constitutional: Patient appears well-developed and well-nourished. Obese  No distress.  HEENT: head atraumatic, normocephalic, pupils equal and reactive to light,  neck supple Cardiovascular: Normal rate, regular rhythm and normal heart sounds.  No murmur heard. No BLE edema. Pulmonary/Chest: Effort normal and breath sounds normal. No respiratory distress. Abdominal: Soft.  There is no tenderness. Psychiatric: Patient has a normal mood and affect. behavior is normal. Judgment and thought content normal.  PHQ2/9: Depression screen [facility] 2/9 10/29/2020 06/23/2020 03/31/2020 03/20/2020 12/20/2019  Decreased Interest 3 1 0 0 1  Down, Depressed, Hopeless 2 1 1 1 1   PHQ - 2 Score 5 2 1 1 2   Altered sleeping 3 2 1 1 3   Tired, decreased energy 3 3 1 1 3   Change in appetite 2 2 0 0 2  Feeling bad or failure about yourself  2 1 1  0 0  Trouble concentrating 0 1 0 0 0  Moving slowly or fidgety/restless 1 0 0 1 0  Suicidal thoughts 1 0 0 0 0  PHQ-9 Score 17 11 4 4 10   Difficult doing work/chores Somewhat difficult Somewhat difficult - Not difficult at all Somewhat difficult  Some recent data might be hidden    phq 9 is positive  GAD 7 : Generalized Anxiety Score 10/29/2020 06/23/2020 03/31/2020 03/20/2020  Nervous, Anxious, on Edge 3 2 2 2   Control/stop worrying 2 1 1  0  Worry too much - different things 3 1 1 1   Trouble relaxing 3 2 1 1   Restless 1 2 1 1   Easily annoyed or irritable 2 2 1  0  Afraid - awful might happen 1 1 0 0  Total GAD 7 Score 15 11 7 5   Anxiety Difficulty Somewhat difficult Somewhat difficult - Not difficult at all    Fall Risk: Fall Risk  10/29/2020 06/23/2020 03/31/2020 03/20/2020 12/20/2019  Falls in the past year? 1 0 1 0 0  Number falls in past yr: 0 0 0 0 0  Injury with Fall?  0 0 0 0 0  Follow up - - - - -     Functional Status Survey: Is the patient deaf or have difficulty  hearing?: No Does the patient have difficulty seeing, even when wearing glasses/contacts?: No Does the patient have difficulty concentrating, remembering, or making decisions?: Yes Does the patient have difficulty walking or climbing stairs?: No Does the patient have difficulty dressing or bathing?: No Does the patient have difficulty doing errands alone such as visiting a doctor's office or shopping?: No    Assessment & Plan  1. GAD (generalized anxiety disorder)  - hydrOXYzine (ATARAX/VISTARIL) 25 MG tablet; Take 1 tablet (25 mg total) by mouth every 8 (eight) hours as needed.  Dispense: 180 tablet; Refill: 0 - ALPRAZolam (XANAX) 0.5 MG tablet; Take 1 tablet (0.5 mg total) by mouth daily as needed for anxiety.  Dispense: 30 tablet; Refill: 0 - escitalopram (LEXAPRO) 20 MG tablet; Take 1 tablet (20 mg total) by mouth daily.  Dispense: 90 tablet; Refill: 0  2. COPD with asthma (HCC)   3. Migraine without aura and responsive to treatment   4. Metabolic syndrome  Labs today   5. Dyslipidemia  Last LDL improved, triglycerides up , we will recheck labs today   6. B12 deficiency   7. Essential hypertension  At goal   8. Vitamin D deficiency  Continue supplementation  9. Peripheral polyneuropathy   10. Moderate major depression (HCC)  - escitalopram (LEXAPRO) 20 MG tablet; Take 1 tablet (20 mg total) by mouth daily.  Dispense: 90 tablet; Refill: 0  11. Perennial allergic rhinitis   12. Moderate persistent asthma without complication   13. Seasonal affective disorder (HCC)  Discussed UV lights.

## 2020-10-29 ENCOUNTER — Encounter: Payer: Self-pay | Admitting: Family Medicine

## 2020-10-29 ENCOUNTER — Other Ambulatory Visit: Payer: Self-pay

## 2020-10-29 ENCOUNTER — Ambulatory Visit (INDEPENDENT_AMBULATORY_CARE_PROVIDER_SITE_OTHER): Payer: BC Managed Care – PPO | Admitting: Family Medicine

## 2020-10-29 VITALS — BP 118/72 | HR 100 | Temp 98.0°F | Resp 18 | Ht 67.0 in | Wt 197.5 lb

## 2020-10-29 DIAGNOSIS — I10 Essential (primary) hypertension: Secondary | ICD-10-CM | POA: Diagnosis not present

## 2020-10-29 DIAGNOSIS — F411 Generalized anxiety disorder: Secondary | ICD-10-CM

## 2020-10-29 DIAGNOSIS — E559 Vitamin D deficiency, unspecified: Secondary | ICD-10-CM

## 2020-10-29 DIAGNOSIS — F321 Major depressive disorder, single episode, moderate: Secondary | ICD-10-CM

## 2020-10-29 DIAGNOSIS — F338 Other recurrent depressive disorders: Secondary | ICD-10-CM

## 2020-10-29 DIAGNOSIS — J449 Chronic obstructive pulmonary disease, unspecified: Secondary | ICD-10-CM

## 2020-10-29 DIAGNOSIS — E785 Hyperlipidemia, unspecified: Secondary | ICD-10-CM

## 2020-10-29 DIAGNOSIS — E538 Deficiency of other specified B group vitamins: Secondary | ICD-10-CM

## 2020-10-29 DIAGNOSIS — E8881 Metabolic syndrome: Secondary | ICD-10-CM

## 2020-10-29 DIAGNOSIS — Z1159 Encounter for screening for other viral diseases: Secondary | ICD-10-CM | POA: Diagnosis not present

## 2020-10-29 DIAGNOSIS — G629 Polyneuropathy, unspecified: Secondary | ICD-10-CM

## 2020-10-29 DIAGNOSIS — J3089 Other allergic rhinitis: Secondary | ICD-10-CM

## 2020-10-29 DIAGNOSIS — G43009 Migraine without aura, not intractable, without status migrainosus: Secondary | ICD-10-CM

## 2020-10-29 DIAGNOSIS — J4540 Moderate persistent asthma, uncomplicated: Secondary | ICD-10-CM

## 2020-10-29 DIAGNOSIS — J4489 Other specified chronic obstructive pulmonary disease: Secondary | ICD-10-CM

## 2020-10-29 MED ORDER — ALPRAZOLAM 0.5 MG PO TABS: 0.5000 mg | ORAL_TABLET | Freq: Every day | ORAL | 0 refills | Status: DC | PRN

## 2020-10-29 MED ORDER — HYDROXYZINE HCL 25 MG PO TABS: 25.0000 mg | ORAL_TABLET | Freq: Three times a day (TID) | ORAL | 0 refills | Status: DC | PRN

## 2020-10-29 MED ORDER — ESCITALOPRAM OXALATE 20 MG PO TABS: 20.0000 mg | ORAL_TABLET | Freq: Every day | ORAL | 0 refills | Status: DC

## 2020-10-30 LAB — COMPREHENSIVE METABOLIC PANEL
ALT: 13 IU/L (ref 0–32)
AST: 16 IU/L (ref 0–40)
Albumin/Globulin Ratio: 2.1 (ref 1.2–2.2)
Albumin: 4.6 g/dL (ref 3.8–4.8)
Alkaline Phosphatase: 80 IU/L (ref 44–121)
BUN/Creatinine Ratio: 8 — ABNORMAL LOW (ref 9–23)
BUN: 7 mg/dL (ref 6–24)
Bilirubin Total: 0.4 mg/dL (ref 0.0–1.2)
CO2: 22 mmol/L (ref 20–29)
Calcium: 9.1 mg/dL (ref 8.7–10.2)
Chloride: 100 mmol/L (ref 96–106)
Creatinine, Ser: 0.85 mg/dL (ref 0.57–1.00)
GFR calc Af Amer: 97 mL/min/{1.73_m2} (ref >59)
GFR calc non Af Amer: 84 mL/min/{1.73_m2} (ref >59)
Globulin, Total: 2.2 g/dL (ref 1.5–4.5)
Glucose: 84 mg/dL (ref 65–99)
Potassium: 4 mmol/L (ref 3.5–5.2)
Sodium: 139 mmol/L (ref 134–144)
Total Protein: 6.8 g/dL (ref 6.0–8.5)

## 2020-10-30 LAB — LIPID PANEL
Chol/HDL Ratio: 4.3 ratio (ref 0.0–4.4)
Cholesterol, Total: 202 mg/dL — ABNORMAL HIGH (ref 100–199)
HDL: 47 mg/dL (ref >39)
LDL Chol Calc (NIH): 113 mg/dL — ABNORMAL HIGH (ref 0–99)
Triglycerides: 243 mg/dL — ABNORMAL HIGH (ref 0–149)
VLDL Cholesterol Cal: 42 mg/dL — ABNORMAL HIGH (ref 5–40)

## 2020-10-30 LAB — HEMOGLOBIN A1C
Est. average glucose Bld gHb Est-mCnc: 140 mg/dL
Hgb A1c MFr Bld: 6.5 % — ABNORMAL HIGH (ref 4.8–5.6)

## 2020-10-30 LAB — HEPATITIS C ANTIBODY: Hep C Virus Ab: <0.1 s/co ratio (ref 0.0–0.9)

## 2020-11-28 DIAGNOSIS — Z1152 Encounter for screening for COVID-19: Secondary | ICD-10-CM | POA: Diagnosis not present

## 2020-12-19 ENCOUNTER — Other Ambulatory Visit: Payer: Self-pay | Admitting: Family Medicine

## 2020-12-19 DIAGNOSIS — F411 Generalized anxiety disorder: Secondary | ICD-10-CM

## 2020-12-24 ENCOUNTER — Encounter: Payer: Self-pay | Admitting: Family Medicine

## 2020-12-25 ENCOUNTER — Telehealth: Payer: BC Managed Care – PPO | Admitting: Physician Assistant

## 2020-12-25 ENCOUNTER — Encounter: Payer: Self-pay | Admitting: Family Medicine

## 2020-12-25 DIAGNOSIS — U071 COVID-19: Secondary | ICD-10-CM

## 2020-12-25 MED ORDER — BENZONATATE 100 MG PO CAPS: 100.0000 mg | ORAL_CAPSULE | Freq: Three times a day (TID) | ORAL | 0 refills | Status: DC | PRN

## 2020-12-25 NOTE — Progress Notes (Signed)
I have spent 5 minutes in review of e-visit questionnaire, review and updating patient chart, medical decision making and response to patient.   Marely Apgar Cody Memori Sammon, PA-C    

## 2020-12-25 NOTE — Progress Notes (Signed)
E-Visit for Corona Virus Screening  Your current symptoms could be consistent with the coronavirus.  Many health care providers can now test patients at their office but not all are.  [facility] has multiple testing sites. For information on our COVID testing locations and hours go to [email or web address]  We are enrolling you in our MyChart Home Monitoring for COVID19 . Daily you will receive a questionnaire within the MyChart website. Our COVID 19 response team will be monitoring your responses daily.  Testing Information: The COVID-19 Community Testing sites are testing BY APPOINTMENT ONLY.  You can schedule online at [email or web address]  If you do not have access to a smart phone or computer you may call [phone] for an appointment.   Additional testing sites in the Community:  . For [facility] Testing sites in [place]  https://www.cvs.com/minuteclinic/covid-19-testing  . For Pop-up testing sites in [place]  https://covid19.ncdhhs.gov/about-covid-19/testing/find-my-testing-place/pop-testing-sites  . For [facility] https://www.[place].gov/[place]ounty/human-services/health-department/coronavirus-covid-19-info/covid-19-testing  . For [place] testing in [place] and [place] https://www.[place].gov/[place]-c[place]/human-services/health-department/coronavirus-covid-19-info/covid-19-testing  . For [facility] testing in [place]   https://[facility].care/covidtesting  For  more information about community testing call [phone]   Please quarantine yourself while awaiting your test results. Please stay home for a minimum of 10 days from the first day of illness with improving symptoms and you have had 24 hours of no fever (without the use of Tylenol (Acetaminophen) Motrin (Ibuprofen) or any fever reducing medication).  Also - Do not get tested prior to returning to work because once you have had a positive test the test can stay  positive for more than a month in some cases.   You should wear a mask or cloth face covering over your nose and mouth if you must be around other people or animals, including pets (even at home). Try to stay at least 6 feet away from other people. This will protect the people around you.  Please continue good preventive care measures, including:  frequent hand-washing, avoid touching your face, cover coughs/sneezes, stay out of crowds and keep a 6 foot distance from others.  COVID-19 is a respiratory illness with symptoms that are similar to the flu. Symptoms are typically mild to moderate, but there have been cases of severe illness and death due to the virus.   The following symptoms may appear 2-14 days after exposure: . Fever . Cough . Shortness of breath or difficulty breathing . Chills . Repeated shaking with chills . Muscle pain . Headache . Sore throat . New loss of taste or smell . Fatigue . Congestion or runny nose . Nausea or vomiting . Diarrhea  Go to the nearest hospital ED for assessment if fever/cough/breathlessness are severe or illness seems like a threat to life.  It is vitally important that if you feel that you have an infection such as this virus or any other virus that you stay home and away from places where you may spread it to others.  You should avoid contact with people age 65 and older.   You can use medication such as A prescription cough medication called Tessalon Perles 100 mg. You may take 1-2 capsules every 8 hours as needed for cough  You may also take acetaminophen (Tylenol) as needed for fever.  Reduce your risk of any infection by using the same precautions used for avoiding the common cold or flu:  . Wash your hands often with soap and warm water for at least 20 seconds.  If soap and water are not   readily available, use an alcohol-based hand sanitizer with at least 60% alcohol.  . If coughing or sneezing, cover your mouth and nose by coughing or  sneezing into the elbow areas of your shirt or coat, into a tissue or into your sleeve (not your hands). . Avoid shaking hands with others and consider head nods or verbal greetings only. . Avoid touching your eyes, nose, or mouth with unwashed hands.  . Avoid close contact with people who are sick. . Avoid places or events with large numbers of people in one location, like concerts or sporting events. . Carefully consider travel plans you have or are making. . If you are planning any travel outside or inside the [place], visit the CDC's Travelers' Health webpage for the latest health notices. . If you have some symptoms but not all symptoms, continue to monitor at home and seek medical attention if your symptoms worsen. . If you are having a medical emergency, call 911.  HOME CARE . Only take medications as instructed by your medical team. . Drink plenty of fluids and get plenty of rest. . A steam or ultrasonic humidifier can help if you have congestion.   GET HELP RIGHT AWAY IF YOU HAVE EMERGENCY WARNING SIGNS** FOR COVID-19. If you or someone is showing any of these signs seek emergency medical care immediately. Call 911 or proceed to your closest emergency facility if: . You develop worsening high fever. . Trouble breathing . Bluish lips or face . Persistent pain or pressure in the chest . New confusion . Inability to wake or stay awake . You cough up blood. . Your symptoms become more severe  **This list is not all possible symptoms. Contact your medical provider for any symptoms that are sever or concerning to you.  MAKE SURE YOU   Understand these instructions.  Will watch your condition.  Will get help right away if you are not doing well or get worse.  Your e-visit answers were reviewed by a board certified advanced clinical practitioner to complete your personal care plan.  Depending on the condition, your plan could have included both over the counter or prescription  medications.  If there is a problem please reply once you have received a response from your provider.  Your safety is important to [place].  If you have drug allergies check your prescription carefully.    You can use MyChart to ask questions about today's visit, request a non-urgent call back, or ask for a work or school excuse for 24 hours related to this e-Visit. If it has been greater than 24 hours you will need to follow up with your provider, or enter a new e-Visit to address those concerns. You will get an e-mail in the next two days asking about your experience.  I hope that your e-visit has been valuable and will speed your recovery. Thank you for using e-visits.    E-Visit for Corona Virus Screening  We are sorry you are not feeling well. We are here to help!  You have tested positive for COVID-19, meaning that you were infected with the novel coronavirus and could give the virus to others.  It is vitally important that you stay home so you do not spread it to others.      Please continue isolation at home, for at least 10 days since the start of your symptoms and until you have had 24 hours with no fever (without taking a fever reducer) and with improving of symptoms.  If you have no symptoms but tested positive (or all symptoms resolve after 5 days and you have no fever) you can leave your house but continue to wear a mask around others for an additional 5 days. If you have a fever,continue to stay home until you have had 24 hours of no fever. Most cases improve 5-10 days from onset but we have seen a small number of patients who have gotten worse after the 10 days.  Please be sure to watch for worsening symptoms and remain taking the proper precautions.   Go to the nearest hospital ED for assessment if fever/cough/breathlessness are severe or illness seems like a threat to life.    The following symptoms may appear 2-14 days after exposure: . Fever . Cough . Shortness of breath or  difficulty breathing . Chills . Repeated shaking with chills . Muscle pain . Headache . Sore throat . New loss of taste or smell . Fatigue . Congestion or runny nose . Nausea or vomiting . Diarrhea  You have been enrolled in [facility] Monitoring for COVID-19. Daily you will receive a questionnaire within the MyChart website. Our COVID-19 response team will be monitoring your responses daily.  You can use medication such as A prescription cough medication called Tessalon Perles 100 mg. You may take 1-2 capsules every 8 hours as needed for cough. Please continue your chronic asthma medications as directed and your typical OTC cough/cold medication. It is also recommended that you start a regimen of OTC Vitamin D3 1000 units daily, Vitamin C 1000 mg daily and a zinc supplement.   We have asked the monoclonal antibody team contact you to discuss the infusion with you and potentially set this up. You should hear from them in the next 48 hours.    You may also take acetaminophen (Tylenol) as needed for fever.  HOME CARE: . Only take medications as instructed by your medical team. . Drink plenty of fluids and get plenty of rest. . A steam or ultrasonic humidifier can help if you have congestion.   GET HELP RIGHT AWAY IF YOU HAVE EMERGENCY WARNING SIGNS.  Call 911 or proceed to your closest emergency facility if: . You develop worsening high fever. . Trouble breathing . Bluish lips or face . Persistent pain or pressure in the chest . New confusion . Inability to wake or stay awake . You cough up blood. . Your symptoms become more severe . Inability to hold down food or fluids  This list is not all possible symptoms. Contact your medical provider for any symptoms that are severe or concerning to you.    Your e-visit answers were reviewed by a board certified advanced clinical practitioner to complete your personal care plan.  Depending on the condition, your plan could have  included both over the counter or prescription medications.  If there is a problem please reply once you have received a response from your provider.  Your safety is important to [place].  If you have drug allergies check your prescription carefully.    You can use MyChart to ask questions about today's visit, request a non-urgent call back, or ask for a work or school excuse for 24 hours related to this e-Visit. If it has been greater than 24 hours you will need to follow up with your provider, or enter a new e-Visit to address those concerns. You will get an e-mail in the next two days asking about your experience.  I hope that your  e-visit has been valuable and will speed your recovery. Thank you for using e-visits.

## 2020-12-27 ENCOUNTER — Encounter (INDEPENDENT_AMBULATORY_CARE_PROVIDER_SITE_OTHER): Payer: Self-pay

## 2020-12-27 ENCOUNTER — Telehealth: Payer: Self-pay | Admitting: Family Medicine

## 2020-12-27 ENCOUNTER — Encounter: Payer: Self-pay | Admitting: *Deleted

## 2020-12-27 NOTE — Telephone Encounter (Signed)
Error

## 2020-12-28 ENCOUNTER — Encounter: Payer: Self-pay | Admitting: Family Medicine

## 2020-12-28 ENCOUNTER — Other Ambulatory Visit: Payer: Self-pay | Admitting: Family Medicine

## 2020-12-28 DIAGNOSIS — J449 Chronic obstructive pulmonary disease, unspecified: Secondary | ICD-10-CM

## 2020-12-28 MED ORDER — ALBUTEROL SULFATE HFA 108 (90 BASE) MCG/ACT IN AERS: 2.0000 | INHALATION_SPRAY | Freq: Four times a day (QID) | RESPIRATORY_TRACT | 0 refills | Status: DC | PRN

## 2021-01-06 ENCOUNTER — Telehealth: Payer: BC Managed Care – PPO | Admitting: Physician Assistant

## 2021-01-06 ENCOUNTER — Ambulatory Visit
Admission: EM | Admit: 2021-01-06 | Discharge: 2021-01-06 | Disposition: A | Discharge status: Home / Self Care | Payer: BC Managed Care – PPO | Attending: Student | Admitting: Student

## 2021-01-06 ENCOUNTER — Other Ambulatory Visit: Payer: Self-pay

## 2021-01-06 DIAGNOSIS — B3731 Acute candidiasis of vulva and vagina: Secondary | ICD-10-CM

## 2021-01-06 DIAGNOSIS — B373 Candidiasis of vulva and vagina: Secondary | ICD-10-CM

## 2021-01-06 DIAGNOSIS — W5321XA Bitten by squirrel, initial encounter: Secondary | ICD-10-CM

## 2021-01-06 DIAGNOSIS — Z23 Encounter for immunization: Secondary | ICD-10-CM

## 2021-01-06 MED ORDER — AMOXICILLIN-POT CLAVULANATE 875-125 MG PO TABS: 1.0000 | ORAL_TABLET | Freq: Two times a day (BID) | ORAL | 0 refills | Status: AC

## 2021-01-06 MED ORDER — TETANUS-DIPHTH-ACELL PERTUSSIS 5-2.5-18.5 LF-MCG/0.5 IM SUSY
0.5000 mL | PREFILLED_SYRINGE | Freq: Once | INTRAMUSCULAR | Status: AC
  Administered 2021-01-06: 0.5 mL via INTRAMUSCULAR

## 2021-01-06 MED ORDER — FLUCONAZOLE 150 MG PO TABS: 150.0000 mg | ORAL_TABLET | Freq: Every day | ORAL | 0 refills | Status: DC

## 2021-01-06 NOTE — Progress Notes (Signed)
Based on what you shared with me, I feel your condition warrants further evaluation and I recommend that you be seen for a face to face office visit. You will need to be evaluated to have your wound thoroughly irrigated. You will need to have your tetanus shot updated. Additionally, while squirrels rarely carry rabies, you will need to be evaluated for the possible need for rabies vaccination.    NOTE: If you entered your credit card information for this eVisit, you will not be charged. You may see a "hold" on your card for the $35 but that hold will drop off and you will not have a charge processed.   If you are having a true medical emergency please call 911.      For an urgent face to face visit, [facility] has five urgent care centers for your convenience:     [facility] at [facility] Directions [phone] [address] [place], [place] [address] . 10 am - 6pm Monday - Friday    [facility]) Get Driving Directions [phone] [address] [place], [place] [address] . 10 am to 8 pm Monday-Friday . 12 pm to 8 pm [facility] Urgent Care at [facility] Get Driving Directions [phone] [address] [place], [place] [address] . 8 am to 8 pm Monday-Friday . 9 am to 6 pm Saturday . 11 am to 6 pm Sunday     [facility] at [facility] Get Driving Directions  [phone] [address] [place], [place] [address] . 8 am to 8 pm Monday-Friday . 8 am to 4 pm [facility] Care at [facility] Directions [phone] [address] [place], [place] [address] . 12 pm to 6 pm Monday-Friday      Your e-visit answers were reviewed by a board certified advanced clinical practitioner to complete your personal care plan.  Thank you for using e-Visits.    Approximately 5 minutes was spent  documenting and reviewing patient's chart.

## 2021-01-06 NOTE — ED Provider Notes (Signed)
[facility]    CSN: [id number] Arrival date & time: 01/06/21  1046      History   Chief Complaint Chief Complaint  Patient presents with  . Animal Bite    HPI Destiny Atkinson is a 45 y.o. female presenting following squirrel bite.  History allergic rhinitis, anxiety, asthma, B12 deficiency, obesity, hypertension, migraines, depression, Mobitz type I, peripheral neuropathy, tobacco use disorder. Pt states scratched by a injured squirrel when trying to cage it last night. They took this to the vet to humanely euthanize it. Therapist, nutritional states no rabies shot needed at this time, but the animal was sent to be tested for rabies just in case. Animal controlled notified and aware. Abrasion to lt pointer finger. States received amoxicillin last night during her e-visit. States just wants someone to look at it. Also desires Diflucan for yeast prophylaxis- history vaginal candida after abx in the past. Denies fevers, chills, other wounds. Denies sensation changes L hand. Denies wounds elsewhere.   HPI  Past Medical History:  Diagnosis Date  . Allergic rhinitis   . Anxiety   . Asthma   . B12 deficiency   . Decreased libido   . Hypertension   . Irregular menstrual bleeding   . Migraines    without aura  . Mild depression (HCC)   . Mobitz type 1 second degree atrioventricular block   . Peripheral neuropathy   . Tobacco use disorder     Patient Active Problem List   Diagnosis Date Noted  . Pre-diabetes 11/17/2017  . Vitamin D deficiency 12/28/2016  . Mild major depression (HCC) 11/17/2015  . GAD (generalized anxiety disorder) 05/06/2015  . Asthma, moderate persistent 05/06/2015  . Mobitz type I incomplete atrioventricular block 05/06/2015  . B12 deficiency 05/06/2015  . Essential (primary) hypertension 05/06/2015  . Irregular bleeding 05/06/2015  . Migraine without aura and responsive to treatment 05/06/2015  . Lack of erotic interest 05/06/2015  . BMI  31.0-31.9,adult 05/06/2015  . Peripheral neuropathy 05/06/2015  . Perennial allergic rhinitis 05/06/2015  . Restless leg 05/06/2015  . Tobacco abuse 05/06/2015    Past Surgical History:  Procedure Laterality Date  . COLONOSCOPY     in 8th grade, not as adult  . ESOPHAGOGASTRODUODENOSCOPY     age 75  . NASAL SINUS SURGERY  01/21/2010   septoplasty/ sinus surgery  . WISDOM TOOTH EXTRACTION  age 34    OB History    Gravida  2   Para  2   Term  2   Preterm      AB      Living  2     SAB      IAB      Ectopic      Multiple      Live Births  2            Home Medications    Prior to Admission medications   Medication Sig Start Date End Date Taking? Authorizing Provider  fluconazole (DIFLUCAN) 150 MG tablet Take 1 tablet (150 mg total) by mouth daily. Take 1 pill today.  If you continue to have symptoms, you can take a second pill on day three. 01/06/21  Yes Rhys Martini, PA-C  albuterol (VENTOLIN HFA) 108 (90 Base) MCG/ACT inhaler Inhale 2 puffs into the lungs every 6 (six) hours as needed for wheezing or shortness of breath. 12/28/20   Alba Cory, MD  ALPRAZolam Prudy Feeler) 0.5 MG tablet Take 1 tablet (0.5 mg  total) by mouth daily as needed for anxiety. 10/29/20   Alba Cory, MD  amoxicillin-clavulanate (AUGMENTIN) 875-125 MG tablet Take 1 tablet by mouth 2 (two) times daily for 7 days. 01/06/21 01/13/21  Couture, Cortni S, PA-C  benzonatate (TESSALON) 100 MG capsule Take 1 capsule (100 mg total) by mouth 3 (three) times daily as needed for cough. 12/25/20   Waldon Merl, PA-C  escitalopram (LEXAPRO) 20 MG tablet Take 1 tablet (20 mg total) by mouth daily. 10/29/20   Alba Cory, MD  Fluticasone-Umeclidin-Vilant (TRELEGY ELLIPTA) 100-62.5-25 MCG/INH AEPB Inhale 1 puff into the lungs daily. 09/18/20   Alba Cory, MD  hydrochlorothiazide (HYDRODIURIL) 12.5 MG tablet Take 1 tablet (12.5 mg total) by mouth daily. 06/23/20   Alba Cory, MD   hydrOXYzine (ATARAX/VISTARIL) 25 MG tablet TAKE 1 TABLET EVERY 8 HOURS AS NEEDED 12/19/20   Carlynn Purl, Danna Hefty, MD  ipratropium-albuterol (DUONEB) 0.5-2.5 (3) MG/3ML SOLN Take 3 mLs by nebulization every 6 (six) hours as needed. 11/10/17   Doren Custard, FNP  levocetirizine (XYZAL) 5 MG tablet Take 1 tablet (5 mg total) by mouth daily. 05/11/18   Alba Cory, MD  Levonorgestrel (LILETTA, 52 MG,) 19.5 MCG/DAY IUD IUD 1 Intra Uterine Device (1 each total) by Intrauterine route once for 1 dose. 04/03/18 02/14/19  Farrel Conners, CNM  loratadine (CLARITIN) 10 MG tablet Take 10 mg by mouth daily.    [provider]  pramipexole (MIRAPEX) 0.25 MG tablet TAKE 1 TO 2 TABLETS AT BEDTIME 08/22/20   Alba Cory, MD  rizatriptan (MAXALT-MLT) 10 MG disintegrating tablet Take 1 tablet (10 mg total) by mouth as needed for migraine. May repeat in 2 hours if needed 03/20/20   Alba Cory, MD  Ubrogepant (UBRELVY) 100 MG TABS Take 1 tablet by mouth daily as needed. 04/01/20   Alba Cory, MD  Vitamin D, Ergocalciferol, (DRISDOL) 1.25 MG (50000 UNIT) CAPS capsule TAKE 1 CAPSULE ONCE A WEEK 06/12/20   Alba Cory, MD    Family History Family History  Problem Relation Age of Onset  . COPD Mother   . Fibromyalgia Mother   . Hypertension Mother   . Stroke Mother   . Asthma Mother   . Arthritis Mother   . Depression Mother   . Heart disease Mother   . Crohn's disease Mother   . Diabetes Father   . Epilepsy Brother   . Diabetes type II Daughter   . Stomach cancer Paternal Grandmother 57  . Ovarian cancer Other 45  . Ovarian cancer Other 50  . Diabetes type II Daughter   . Kidney Stones Daughter   . Bipolar disorder Maternal Grandmother     Social History Social History   Tobacco Use  . Smoking status: Current Every Day Smoker    Packs/day: 0.25    Years: 24.00    Pack years: 6.00    Types: Cigarettes, E-cigarettes    Start date: 06/29/1995  . Smokeless tobacco: Never Used   Vaping Use  . Vaping Use: Former  . Start date: 02/14/2015  . Quit date: 11/15/2018  Substance Use Topics  . Alcohol use: Yes    Alcohol/week: 0.0 standard drinks    Comment: rarely  . Drug use: No     Allergies   Dog epithelium, Dust mite extract, Pollen extract, Soap, Tape, and Tree extract   Review of Systems Review of Systems  Skin: Positive for wound.  All other systems reviewed and are negative.    Physical Exam Triage Vital Signs  ED Triage Vitals  Enc Vitals Group     BP 01/06/21 1108 131/77     Pulse Rate 01/06/21 1108 90     Resp 01/06/21 1108 18     Temp 01/06/21 1108 98.5 F (36.9 C)     Temp Source 01/06/21 1108 Oral     SpO2 01/06/21 1108 98 %     Weight --      Height --      Head Circumference --      Peak Flow --      Pain Score 01/06/21 1110 6     Pain Loc --      Pain Edu? --      Excl. in GC? --    No data found.  Updated Vital Signs BP 131/77 (BP Location: Left Arm)   Pulse 90   Temp 98.5 F (36.9 C) (Oral)   Resp 18   SpO2 98%   Visual Acuity Right Eye Distance:   Left Eye Distance:   Bilateral Distance:    Right Eye Near:   Left Eye Near:    Bilateral Near:     Physical Exam Vitals reviewed.  Constitutional:      Appearance: Normal appearance.  Cardiovascular:     Rate and Rhythm: Normal rate and regular rhythm.     Heart sounds: Normal heart sounds.  Pulmonary:     Effort: Pulmonary effort is normal.     Breath sounds: Normal breath sounds.  Skin:    Comments: L pointer finger - one shallow puncture wound overlying ventral PIP joint. Appears to be healing well, not actively bleeding, no warmth, no surrounding erythema. ROM fingers and wrist intact and without pain. Neurovascularly intact. Cap refill <2 seconds.    Neurological:     General: No focal deficit present.     Mental Status: She is alert and oriented to person, place, and time.  Psychiatric:        Mood and Affect: Mood normal.        Behavior: Behavior  normal.        Thought Content: Thought content normal.        Judgment: Judgment normal.      UC Treatments / Results  Labs (all labs ordered are listed, but only abnormal results are displayed) Labs Reviewed - No data to display  EKG   Radiology No results found.  Procedures Procedures (including critical care time)  Medications Ordered in UC Medications  Tdap (BOOSTRIX) injection 0.5 mL (has no administration in time range)    Initial Impression / Assessment and Plan / UC Course  I have reviewed the triage vital signs and the nursing notes.  Pertinent labs & imaging results that were available during my care of the patient were reviewed by me and considered in my medical decision making (see chart for details).     This patient is an 45 year old female presenting following squirrel bite.  On exam, one shallow puncture wound, healing well on its own.  Today she is afebrile and nontachycardic.  Vet has a low suspicion for rabies, but the animal was sent to lab for testing.  They understand to return for rabies shots if this comes back as positive.   Augmentin sent for antibiotic prophylaxis by PCP already; advised her to take this as directed.  History of vaginal candidiasis following antibiotics, so Diflucan sent.  Wound care instructions provided.  Tdap last administered 2013; administered today.  Spent over 40 minutes obtaining H&P,  performing physical, discussing results, treatment plan and plan for follow-up with patient. Patient agrees with plan.   This chart was dictated using voice recognition software, Dragon. Despite the best efforts of this provider to proofread and correct errors, errors may still occur which can change documentation meaning.  Final Clinical Impressions(s) / UC Diagnoses   Final diagnoses:  Wound due to squirrel bite  Vaginal candidiasis  Need for Tdap vaccination     Discharge Instructions     -Start the Augmentin as prescribed by  your doctor. -To prevent yeast infection, take 1 pill of Diflucan today.  If you develop symptoms, or symptoms persist in 3 days, you can take the second pill Diflucan. -Continue keeping your wounds clean and dry. -We administered a tetanus booster shot today.   ED Prescriptions    Medication Sig Dispense Auth. Provider   fluconazole (DIFLUCAN) 150 MG tablet Take 1 tablet (150 mg total) by mouth daily. Take 1 pill today.  If you continue to have symptoms, you can take a second pill on day three. 2 tablet Rhys MartiniGraham, Hargis Vandyne E, PA-C     PDMP not reviewed this encounter.   Rhys MartiniGraham, Maryella Abood E, PA-C 01/06/21 1150

## 2021-01-06 NOTE — ED Triage Notes (Addendum)
Pt states scratched by a injured squirrel when trying to cage it last night. Therapist, nutritional states no rabies shot needed at this time. Animal controlled notified and aware. Abrasion to lt pointer finger. States received amoxicillin last night during her e-visit. States just wants someone to look at it.

## 2021-01-06 NOTE — Addendum Note (Signed)
Addended by: Karrie Meres on: 01/06/2021 09:49 AM   Modules accepted: Orders

## 2021-01-06 NOTE — Discharge Instructions (Signed)
-  Start the Augmentin as prescribed by your doctor. -To prevent yeast infection, take 1 pill of Diflucan today.  If you develop symptoms, or symptoms persist in 3 days, you can take the second pill Diflucan. -Continue keeping your wounds clean and dry. -We administered a tetanus booster shot today.

## 2021-01-07 ENCOUNTER — Telehealth: Payer: Self-pay | Admitting: *Deleted

## 2021-01-07 NOTE — Telephone Encounter (Signed)
Call to patient: Patient reports she has severe diarrhea last night- but reports it is better today. Patient is treating with Imodium and Pepto.  Patient advised: If worsening diarrhea occurs and becomes severe(6-7 bowel movements a day): notify primary care provider. If diarrhea greater than 87 days: notify provider. If signs of dehydration occur: Seek treatment in the ED (increased thirst, decreased urine output, dry skin, yellow urine, headache or dizziness) Patient voices understanding and will continue to monitor symptoms.

## 2021-01-14 ENCOUNTER — Other Ambulatory Visit: Payer: Self-pay | Admitting: Family Medicine

## 2021-01-14 DIAGNOSIS — F321 Major depressive disorder, single episode, moderate: Secondary | ICD-10-CM

## 2021-01-14 DIAGNOSIS — F411 Generalized anxiety disorder: Secondary | ICD-10-CM

## 2021-01-14 NOTE — Telephone Encounter (Signed)
Future visit in 1 month  

## 2021-02-15 ENCOUNTER — Other Ambulatory Visit: Payer: Self-pay | Admitting: Family Medicine

## 2021-02-15 DIAGNOSIS — F411 Generalized anxiety disorder: Secondary | ICD-10-CM

## 2021-02-16 MED ORDER — ALPRAZOLAM 0.5 MG PO TABS: 0.5000 mg | ORAL_TABLET | Freq: Every day | ORAL | 0 refills | Status: DC | PRN

## 2021-02-26 ENCOUNTER — Encounter: Payer: Self-pay | Admitting: Family Medicine

## 2021-02-26 ENCOUNTER — Ambulatory Visit: Payer: BC Managed Care – PPO | Admitting: Family Medicine

## 2021-03-01 ENCOUNTER — Other Ambulatory Visit: Payer: Self-pay | Admitting: Family Medicine

## 2021-03-01 DIAGNOSIS — F411 Generalized anxiety disorder: Secondary | ICD-10-CM

## 2021-03-01 MED ORDER — ALPRAZOLAM 0.5 MG PO TABS: 0.5000 mg | ORAL_TABLET | Freq: Every day | ORAL | 0 refills | Status: DC | PRN

## 2021-03-19 ENCOUNTER — Encounter: Payer: Self-pay | Admitting: Family Medicine

## 2021-07-21 NOTE — Progress Notes (Signed)
Destiny Atkinson   MRN: [id number]    DOB: [birth year]   Date:07/22/2021       Progress Note  Subjective  Chief Complaint  Medication Refill  HPI  Migraine : episodes not as frequent again down to less than once a month   Not associated nausea or vomiting, but is associated with photophobia. Migraine is described as a sharp pain over left eye and radiates to left temporal area and sometimes nuchal area. She takes Bernita Raisin first and has been working, sometimes has to take a dose of Maxalt    HTN: we stopped Losartan back in June 2019 because of dizziness and is doing well on HCTZ and bp has been at goal. Denies palpitation, she has intermittent sharp pain on right side of breast come in spells - going on for the past 5 months, sporadic.    GAD/Major Depression: she left [employer] and started a new job at [employer] in [place] back in May 2017, she left [employer] March 2018 when she walked away from her job,  and was unemployed until 06/2017. 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 She is adjusting but doing well   She iis taking medications, we will decrease alprazolam to 24 to last 3 months  New onset DM II:  she states she had labs done at work while in [facility] and fasting glucose was 140, we check A1C today and it was 6.7 % . She denies polyphagia, but has polydipsia .We discussed options she is willing to try Rybelsus, discussed possible side effects, denies personal history of pancreatitis or family history of thyroid cancer    RLS/Peripheral Neuropathy: She has a need to move her legs when sitting down, also states that at night her legs moves constantly. Numbness had been  intermittent on hands and feet. She is doing well on MIrapex    AR: she his allergic to cats, but has 3 cats at home, also allergic to dogs and has one of them,. She has itchy eyes and nose, also her throat . She has been taking loratadine, xyzal,. Unchanged    Asthma/COPD: she still smokes, about 12 per day cigarettes daily. She states symptoms had improved with Trelegy but had to stop due to trush. She states she has cough, wheezing, worse in the mornings.    Dyslipidemia: we will recheck labs today   Metabolic syndrome: labs hgbA1C was 6% She denies polyphagia, polydipsia or polyuria. Reminded her of low carbohydrate diet , also to exercise more. Her youngest daughter and father have DM, she is due for labs    BPPV: seen by ENT and symptoms of dizziness has resolved Unchanged   Patient Active Problem List   Diagnosis Date Noted   Pre-diabetes 11/17/2017   Vitamin D deficiency 12/28/2016   Mild major depression (HCC) 11/17/2015   GAD (generalized anxiety disorder) 05/06/2015   Asthma, moderate persistent 05/06/2015   Mobitz type I incomplete atrioventricular block 05/06/2015   B12 deficiency 05/06/2015   Essential (primary) hypertension 05/06/2015   Irregular bleeding 05/06/2015   Migraine without aura and responsive  to treatment 05/06/2015   BMI 31.0-31.9,adult 05/06/2015   Peripheral neuropathy 05/06/2015   Perennial allergic rhinitis 05/06/2015   Restless leg 05/06/2015   Tobacco abuse 05/06/2015    Past Surgical History:  Procedure Laterality Date   COLONOSCOPY     in 8th grade, not as adult   ESOPHAGOGASTRODUODENOSCOPY     age [age]   NASAL SINUS SURGERY  01/21/2010   septoplasty/ sinus surgery   WISDOM TOOTH EXTRACTION  age 45    Family History  Problem Relation Age of Onset   COPD Mother    Fibromyalgia Mother    Hypertension Mother    Stroke Mother    Asthma Mother    Arthritis Mother    Depression Mother    Heart disease Mother    Crohn's disease Mother     Diabetes Father    Epilepsy Brother    Diabetes type II Daughter    Stomach cancer Paternal Grandmother [id number]   Ovarian cancer Other 45   Ovarian cancer Other 50   Diabetes type II Daughter    Kidney Stones Daughter    Bipolar disorder Maternal Grandmother     Social History   Tobacco Use   Smoking status: Every Day    Packs/day: 0.25    Years: 24.00    Pack years: 6.00    Types: Cigarettes, E-cigarettes    Start date: 06/29/1995   Smokeless tobacco: Never  Substance Use Topics   Alcohol use: Yes    Alcohol/week: 0.0 standard drinks    Comment: rarely     Current Outpatient Medications:    albuterol (VENTOLIN HFA) 108 (90 Base) MCG/ACT inhaler, Inhale 2 puffs into the lungs every 6 (six) hours as needed for wheezing or shortness of breath., Disp: 8 g, Rfl: 0   ipratropium-albuterol (DUONEB) 0.5-2.5 (3) MG/3ML SOLN, Take 3 mLs by nebulization every 6 (six) hours as needed., Disp: 360 mL, Rfl: 1   levocetirizine (XYZAL) 5 MG tablet, Take 1 tablet (5 mg total) by mouth daily., Disp: 90 tablet, Rfl: 1   loratadine (CLARITIN) 10 MG tablet, Take 10 mg by mouth daily., Disp: , Rfl:    rizatriptan (MAXALT-MLT) 10 MG disintegrating tablet, Take 1 tablet (10 mg total) by mouth as needed for migraine. May repeat in 2 hours if needed, Disp: 27 tablet, Rfl: 1   umeclidinium-vilanterol (ANORO ELLIPTA) 62.5-25 MCG/INH AEPB, Inhale 1 puff into the lungs daily., Disp: , Rfl:    ALPRAZolam (XANAX) 0.5 MG tablet, Take 1 tablet (0.5 mg total) by mouth daily as needed for anxiety., Disp: 24 tablet, Rfl: 0   escitalopram (LEXAPRO) 20 MG tablet, Take 1 tablet (20 mg total) by mouth daily., Disp: 90 tablet, Rfl: 1   hydrochlorothiazide (HYDRODIURIL) 12.5 MG tablet, Take 1 tablet (12.5 mg total) by mouth daily., Disp: 90 tablet, Rfl: 1   hydrOXYzine (ATARAX/VISTARIL) 25 MG tablet, Take 1 tablet (25 mg total) by mouth at bedtime as needed., Disp: 90 tablet, Rfl: 1   Levonorgestrel (LILETTA, 52 MG,) 19.5  MCG/DAY IUD IUD, 1 Intra Uterine Device (1 each total) by Intrauterine route once for 1 dose., Disp: 1 each, Rfl: 0   pramipexole (MIRAPEX) 0.25 MG tablet, TAKE 1 TO 2 TABLETS AT BEDTIME, Disp: 90 tablet, Rfl: 1   Ubrogepant (UBRELVY) 100 MG TABS, Take 1 tablet by mouth daily as needed., Disp: 10 tablet, Rfl: 2   Vitamin D, Ergocalciferol, (DRISDOL) 1.25 MG (50000 UNIT) CAPS capsule, TAKE 1 CAPSULE ONCE A WEEK, Disp: 12 capsule,  Rfl: 1  Allergies  Allergen Reactions   Dog Epithelium     Anaphylaxis with Rabbits.  Dogs, cats, birds cause swelling, extreme itching.   Dust Mite Extract    Pollen Extract    Soap    Tape    Tree Extract     I personally reviewed active problem list, medication list, allergies, family history, social history, health maintenance with the patient/caregiver today.   ROS  Constitutional: Negative for fever or weight change.  Respiratory: Negative for cough and shortness of breath.   Cardiovascular: Negative for chest pain or palpitations.  Gastrointestinal: Negative for abdominal pain, no bowel changes.  Musculoskeletal: Negative for gait problem or joint swelling.  Skin: Negative for rash.  Neurological: Negative for dizziness , positive for intermittent  headache.  No other specific complaints in a complete review of systems (except as listed in HPI above).   Objective  Vitals:   07/22/21 0909  BP: 128/74  Pulse: 96  Resp: 16  Temp: 98 F (36.7 C)  SpO2: 96%  Weight: 200 lb (90.7 kg)  Height: 5\' 6"  (1.676 m)    Body mass index is 32.28 kg/m.  Physical Exam  Constitutional: Patient appears well-developed and well-nourished. Obese  No distress.  HEENT: head atraumatic, normocephalic, pupils equal and reactive to light, neck supple Cardiovascular: Normal rate, regular rhythm and normal heart sounds.  No murmur heard. No BLE edema. Pulmonary/Chest: Effort normal and breath sounds normal. No respiratory distress. Abdominal: Soft.  There is no  tenderness. Psychiatric: Patient has a normal mood and affect. behavior is normal. Judgment and thought content normal.    PHQ2/9: Depression screen [facility] 2/9 07/22/2021 10/29/2020 06/23/2020 03/31/2020 03/20/2020  Decreased Interest 2 3 1  0 0  Down, Depressed, Hopeless 2 2 1 1 1   PHQ - 2 Score 4 5 2 1 1   Altered sleeping 3 3 2 1 1   Tired, decreased energy 0 3 3 1 1   Change in appetite 2 2 2  0 0  Feeling bad or failure about yourself  1 2 1 1  0  Trouble concentrating 1 0 1 0 0  Moving slowly or fidgety/restless 0 1 0 0 1  Suicidal thoughts 0 1 0 0 0  PHQ-9 Score 11 17 11 4 4   Difficult doing work/chores - Somewhat difficult Somewhat difficult - Not difficult at all  Some recent data might be hidden    phq 9 is positive   Fall Risk: Fall Risk  07/22/2021 10/29/2020 06/23/2020 03/31/2020 03/20/2020  Falls in the past year? 0 1 0 1 0  Number falls in past yr: 0 0 0 0 0  Injury with Fall? 0 0 0 0 0  Risk for fall due to : No Fall Risks - - - -  Follow up Falls prevention discussed - - - -     Functional Status Survey: Is the patient deaf or have difficulty hearing?: No Does the patient have difficulty seeing, even when wearing glasses/contacts?: No Does the patient have difficulty concentrating, remembering, or making decisions?: Yes Does the patient have difficulty walking or climbing stairs?: No Does the patient have difficulty dressing or bathing?: No Does the patient have difficulty doing errands alone such as visiting a doctor's office or shopping?: No    Assessment & Plan 1. COPD with asthma (HCC)  She cannot tolerate Trelegy, recurrent trush, we will try Anoro  2. Migraine without aura and responsive to treatment  - Ubrogepant (UBRELVY) 100 MG TABS; Take 1 tablet  by mouth daily as needed.  Dispense: 10 tablet; Refill: 2  3. Metabolic syndrome  - POCT HgB R4B  4. B12 deficiency  - Vitamin B12  5. Dyslipidemia  - Lipid panel  6. Vitamin D deficiency  - Vitamin D,  Ergocalciferol, (DRISDOL) 1.25 MG (50000 UNIT) CAPS capsule; TAKE 1 CAPSULE ONCE A WEEK  Dispense: 12 capsule; Refill: 1 - VITAMIN D 25 Hydroxy (Vit-D Deficiency, Fractures)  7. Peripheral polyneuropathy   8. Essential hypertension  - hydrochlorothiazide (HYDRODIURIL) 12.5 MG tablet; Take 1 tablet (12.5 mg total) by mouth daily.  Dispense: 90 tablet; Refill: 1 - CBC with Differential/Platelet - COMPLETE METABOLIC PANEL WITH GFR  9. GAD (generalized anxiety disorder)  - escitalopram (LEXAPRO) 20 MG tablet; Take 1 tablet (20 mg total) by mouth daily.  Dispense: 90 tablet; Refill: 1 - hydrOXYzine (ATARAX/VISTARIL) 25 MG tablet; Take 1 tablet (25 mg total) by mouth at bedtime as needed.  Dispense: 90 tablet; Refill: 1  10. Moderate major depression (HCC)  - escitalopram (LEXAPRO) 20 MG tablet; Take 1 tablet (20 mg total) by mouth daily.  Dispense: 90 tablet; Refill: 1  11. Perennial allergic rhinitis   12. Hyperglycemia   13. RLS (restless legs syndrome)  - pramipexole (MIRAPEX) 0.25 MG tablet; TAKE 1 TO 2 TABLETS AT BEDTIME  Dispense: 90 tablet; Refill: 1   14. Diabetes mellitus type 2 in obese (HCC)  - Semaglutide (RYBELSUS) 7 MG TABS; Take 7 mg by mouth daily.  Dispense: 30 tablet; Refill: 1

## 2021-07-22 ENCOUNTER — Encounter: Payer: Self-pay | Admitting: Family Medicine

## 2021-07-22 ENCOUNTER — Ambulatory Visit: Payer: BC Managed Care – PPO | Admitting: Family Medicine

## 2021-07-22 ENCOUNTER — Other Ambulatory Visit: Payer: Self-pay

## 2021-07-22 VITALS — BP 128/74 | HR 96 | Temp 98.0°F | Resp 16 | Ht 66.0 in | Wt 200.0 lb

## 2021-07-22 DIAGNOSIS — G43009 Migraine without aura, not intractable, without status migrainosus: Secondary | ICD-10-CM | POA: Diagnosis not present

## 2021-07-22 DIAGNOSIS — G629 Polyneuropathy, unspecified: Secondary | ICD-10-CM

## 2021-07-22 DIAGNOSIS — I10 Essential (primary) hypertension: Secondary | ICD-10-CM

## 2021-07-22 DIAGNOSIS — E8881 Metabolic syndrome: Secondary | ICD-10-CM

## 2021-07-22 DIAGNOSIS — E538 Deficiency of other specified B group vitamins: Secondary | ICD-10-CM

## 2021-07-22 DIAGNOSIS — G2581 Restless legs syndrome: Secondary | ICD-10-CM

## 2021-07-22 DIAGNOSIS — E669 Obesity, unspecified: Secondary | ICD-10-CM

## 2021-07-22 DIAGNOSIS — E785 Hyperlipidemia, unspecified: Secondary | ICD-10-CM

## 2021-07-22 DIAGNOSIS — F321 Major depressive disorder, single episode, moderate: Secondary | ICD-10-CM

## 2021-07-22 DIAGNOSIS — J449 Chronic obstructive pulmonary disease, unspecified: Secondary | ICD-10-CM | POA: Diagnosis not present

## 2021-07-22 DIAGNOSIS — E1169 Type 2 diabetes mellitus with other specified complication: Secondary | ICD-10-CM

## 2021-07-22 DIAGNOSIS — R739 Hyperglycemia, unspecified: Secondary | ICD-10-CM

## 2021-07-22 DIAGNOSIS — F411 Generalized anxiety disorder: Secondary | ICD-10-CM

## 2021-07-22 DIAGNOSIS — J3089 Other allergic rhinitis: Secondary | ICD-10-CM

## 2021-07-22 DIAGNOSIS — E559 Vitamin D deficiency, unspecified: Secondary | ICD-10-CM

## 2021-07-22 LAB — POCT GLYCOSYLATED HEMOGLOBIN (HGB A1C): Hemoglobin A1C: 6.7 % — AB (ref 4.0–5.6)

## 2021-07-22 MED ORDER — ALPRAZOLAM 0.5 MG PO TABS: 0.5000 mg | ORAL_TABLET | Freq: Every day | ORAL | 0 refills | Status: DC | PRN

## 2021-07-22 MED ORDER — HYDROCHLOROTHIAZIDE 12.5 MG PO TABS: 12.5000 mg | ORAL_TABLET | Freq: Every day | ORAL | 1 refills | Status: DC

## 2021-07-22 MED ORDER — ESCITALOPRAM OXALATE 20 MG PO TABS: 20.0000 mg | ORAL_TABLET | Freq: Every day | ORAL | 1 refills | Status: DC

## 2021-07-22 MED ORDER — UBRELVY 100 MG PO TABS: 1.0000 | ORAL_TABLET | Freq: Every day | ORAL | 2 refills | Status: DC | PRN

## 2021-07-22 MED ORDER — HYDROXYZINE HCL 25 MG PO TABS: 25.0000 mg | ORAL_TABLET | Freq: Every evening | ORAL | 1 refills | Status: DC | PRN

## 2021-07-22 MED ORDER — PRAMIPEXOLE DIHYDROCHLORIDE 0.25 MG PO TABS: ORAL_TABLET | ORAL | 1 refills | Status: DC

## 2021-07-22 MED ORDER — VITAMIN D (ERGOCALCIFEROL) 1.25 MG (50000 UNIT) PO CAPS: ORAL_CAPSULE | ORAL | 1 refills | Status: DC

## 2021-07-22 MED ORDER — RYBELSUS 7 MG PO TABS: 7.0000 mg | ORAL_TABLET | Freq: Every day | ORAL | 1 refills | Status: DC

## 2021-07-23 LAB — CBC WITH DIFFERENTIAL/PLATELET
Absolute Monocytes: 608 cells/uL (ref 200–950)
Basophils Absolute: 58 cells/uL (ref 0–200)
Basophils Relative: 0.9 %
Eosinophils Absolute: 250 cells/uL (ref 15–500)
Eosinophils Relative: 3.9 %
HCT: 35.1 % (ref 35.0–45.0)
Hemoglobin: 11.9 g/dL (ref 11.7–15.5)
Lymphs Abs: 2272 cells/uL (ref 850–3900)
MCH: 32.2 pg (ref 27.0–33.0)
MCHC: 33.9 g/dL (ref 32.0–36.0)
MCV: 95.1 fL (ref 80.0–100.0)
MPV: 9.5 fL (ref 7.5–12.5)
Monocytes Relative: 9.5 %
Neutro Abs: 3213 cells/uL (ref 1500–7800)
Neutrophils Relative %: 50.2 %
Platelets: 274 10*3/uL (ref 140–400)
RBC: 3.69 10*6/uL — ABNORMAL LOW (ref 3.80–5.10)
RDW: 13.1 % (ref 11.0–15.0)
Total Lymphocyte: 35.5 %
WBC: 6.4 10*3/uL (ref 3.8–10.8)

## 2021-07-23 LAB — VITAMIN B12: Vitamin B-12: 382 pg/mL (ref 200–1100)

## 2021-07-23 LAB — COMPLETE METABOLIC PANEL WITH GFR
AG Ratio: 2 (calc) (ref 1.0–2.5)
ALT: 14 U/L (ref 6–29)
AST: 16 U/L (ref 10–30)
Albumin: 4.4 g/dL (ref 3.6–5.1)
Alkaline phosphatase (APISO): 62 U/L (ref 31–125)
BUN: 13 mg/dL (ref 7–25)
CO2: 28 mmol/L (ref 20–32)
Calcium: 9.4 mg/dL (ref 8.6–10.2)
Chloride: 105 mmol/L (ref 98–110)
Creat: 0.76 mg/dL (ref 0.50–0.99)
Globulin: 2.2 g/dL (calc) (ref 1.9–3.7)
Glucose, Bld: 146 mg/dL — ABNORMAL HIGH (ref 65–139)
Potassium: 4.4 mmol/L (ref 3.5–5.3)
Sodium: 139 mmol/L (ref 135–146)
Total Bilirubin: 0.5 mg/dL (ref 0.2–1.2)
Total Protein: 6.6 g/dL (ref 6.1–8.1)
eGFR: 100 mL/min/{1.73_m2} (ref >=60)

## 2021-07-23 LAB — LIPID PANEL
Cholesterol: 196 mg/dL (ref <200)
HDL: 44 mg/dL — ABNORMAL LOW (ref >=50)
LDL Cholesterol (Calc): 114 mg/dL (calc) — ABNORMAL HIGH
Non-HDL Cholesterol (Calc): 152 mg/dL (calc) — ABNORMAL HIGH (ref <130)
Total CHOL/HDL Ratio: 4.5 (calc) (ref <5.0)
Triglycerides: 263 mg/dL — ABNORMAL HIGH (ref <150)

## 2021-07-23 LAB — VITAMIN D 25 HYDROXY (VIT D DEFICIENCY, FRACTURES): Vit D, 25-Hydroxy: 24 ng/mL — ABNORMAL LOW (ref 30–100)

## 2021-08-02 ENCOUNTER — Encounter: Payer: Self-pay | Admitting: Family Medicine

## 2021-08-09 DIAGNOSIS — M62830 Muscle spasm of back: Secondary | ICD-10-CM | POA: Diagnosis not present

## 2021-08-09 DIAGNOSIS — M9901 Segmental and somatic dysfunction of cervical region: Secondary | ICD-10-CM | POA: Diagnosis not present

## 2021-08-09 DIAGNOSIS — M542 Cervicalgia: Secondary | ICD-10-CM | POA: Diagnosis not present

## 2021-08-09 DIAGNOSIS — M9903 Segmental and somatic dysfunction of lumbar region: Secondary | ICD-10-CM | POA: Diagnosis not present

## 2021-09-06 ENCOUNTER — Encounter: Payer: Self-pay | Admitting: Family Medicine

## 2021-09-06 ENCOUNTER — Telehealth (INDEPENDENT_AMBULATORY_CARE_PROVIDER_SITE_OTHER): Payer: BC Managed Care – PPO | Admitting: Family Medicine

## 2021-09-06 DIAGNOSIS — J069 Acute upper respiratory infection, unspecified: Secondary | ICD-10-CM | POA: Diagnosis not present

## 2021-09-06 DIAGNOSIS — J4541 Moderate persistent asthma with (acute) exacerbation: Secondary | ICD-10-CM | POA: Diagnosis not present

## 2021-09-06 MED ORDER — METHYLPREDNISOLONE 4 MG PO TBPK: ORAL_TABLET | ORAL | 0 refills | Status: DC

## 2021-09-06 MED ORDER — HYDROCOD POLST-CPM POLST ER 10-8 MG/5ML PO SUER: 5.0000 mL | Freq: Two times a day (BID) | ORAL | 0 refills | Status: DC | PRN

## 2021-09-06 NOTE — Progress Notes (Signed)
Name: Destiny Atkinson   MRN: [id number]    DOB: 02/24/1976   Date:09/06/2021       Progress Note  Subjective  Chief Complaint  URI  I connected with  Destiny Atkinson  on 09/06/21 at 10:20 AM EDT by a video enabled telemedicine application and verified that I am speaking with the correct person using two identifiers.  I discussed the limitations of evaluation and management by telemedicine and the availability of in person appointments. The patient expressed understanding and agreed to proceed with the virtual visit  Staff also discussed with the patient that there may be a patient responsible charge related to this service. Patient Location: home  Provider Location: [facility] Additional Individuals present: husband  HPI  URI: symptoms started with rhinorrhea and ears popping on Oct 12 th, 2022 followed by post-nasal drainage and a hard cough that is making her body ache. She has been taking Mucinex  and also tessalon perrles without much help . She did at home test for COVID-19 and it was negative. She never got COVID-19 vaccines. She denies fever or chills. She has a history of asthma and has noticed increase in wheezing. She smokes but not since she has been sick. She has noticed a decrease in appetite ( but started prior to feeling sick - due to Rybelsus) . Initially had a sore throat but improves now     Patient Active Problem List   Diagnosis Date Noted   Pre-diabetes 11/17/2017   Vitamin D deficiency 12/28/2016   Mild major depression (HCC) 11/17/2015   GAD (generalized anxiety disorder) 05/06/2015   Asthma, moderate persistent 05/06/2015   Mobitz type I incomplete atrioventricular block 05/06/2015   B12 deficiency 05/06/2015   Essential (primary) hypertension 05/06/2015   Irregular bleeding 05/06/2015   Migraine without aura and responsive to treatment 05/06/2015   BMI 31.0-31.9,adult 05/06/2015   Peripheral neuropathy 05/06/2015   Perennial allergic rhinitis 05/06/2015    Restless leg 05/06/2015   Tobacco abuse 05/06/2015    Past Surgical History:  Procedure Laterality Date   COLONOSCOPY     in 8th grade, not as adult   ESOPHAGOGASTRODUODENOSCOPY     age 70   NASAL SINUS SURGERY  01/21/2010   septoplasty/ sinus surgery   WISDOM TOOTH EXTRACTION  age 44    Family History  Problem Relation Age of Onset   COPD Mother    Fibromyalgia Mother    Hypertension Mother    Stroke Mother    Asthma Mother    Arthritis Mother    Depression Mother    Heart disease Mother    Crohn's disease Mother    Diabetes Father    Epilepsy Brother    Diabetes type II Daughter    Stomach cancer Paternal Grandmother 1   Ovarian cancer Other 45   Ovarian cancer Other 50   Diabetes type II Daughter    Kidney Stones Daughter    Bipolar disorder Maternal Grandmother     Social History   Socioeconomic History   Marital status: Married    Spouse name: Reuel Boom   Number of children: 2   Years of education: 14   Highest education level: Some college, no degree  Occupational History   Occupation: Associate Professor: [employer]  Tobacco Use   Smoking status: Every Day    Packs/day: 0.25    Years: 24.00    Pack years: 6.00    Types: Cigarettes, E-cigarettes  Start date: 06/29/1995   Smokeless tobacco: Never  Vaping Use   Vaping Use: Former   Start date: 02/14/2015   Quit date: 11/15/2018  Substance and Sexual Activity   Alcohol use: Yes    Alcohol/week: 0.0 standard drinks    Comment: rarely   Drug use: No   Sexual activity: Yes    Partners: Male    Birth control/protection: I.U.D.  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, community   Social Determinants of Corporate investment banker Strain: Not on file  Food Insecurity: Not on file  Transportation Needs: Not on file  Physical Activity: Sufficiently  Active   Days of Exercise per Week: 5 days   Minutes of Exercise per Session: 40 min  Stress: Not on file  Social Connections: Not on file  Intimate Partner Violence: Not on file     Current Outpatient Medications:    albuterol (VENTOLIN HFA) 108 (90 Base) MCG/ACT inhaler, Inhale 2 puffs into the lungs every 6 (six) hours as needed for wheezing or shortness of breath., Disp: 8 g, Rfl: 0   ALPRAZolam (XANAX) 0.5 MG tablet, Take 1 tablet (0.5 mg total) by mouth daily as needed for anxiety., Disp: 24 tablet, Rfl: 0   escitalopram (LEXAPRO) 20 MG tablet, Take 1 tablet (20 mg total) by mouth daily., Disp: 90 tablet, Rfl: 1   hydrochlorothiazide (HYDRODIURIL) 12.5 MG tablet, Take 1 tablet (12.5 mg total) by mouth daily., Disp: 90 tablet, Rfl: 1   hydrOXYzine (ATARAX/VISTARIL) 25 MG tablet, Take 1 tablet (25 mg total) by mouth at bedtime as needed., Disp: 90 tablet, Rfl: 1   ipratropium-albuterol (DUONEB) 0.5-2.5 (3) MG/3ML SOLN, Take 3 mLs by nebulization every 6 (six) hours as needed., Disp: 360 mL, Rfl: 1   levocetirizine (XYZAL) 5 MG tablet, Take 1 tablet (5 mg total) by mouth daily., Disp: 90 tablet, Rfl: 1   loratadine (CLARITIN) 10 MG tablet, Take 10 mg by mouth daily., Disp: , Rfl:    pramipexole (MIRAPEX) 0.25 MG tablet, TAKE 1 TO 2 TABLETS AT BEDTIME, Disp: 90 tablet, Rfl: 1   rizatriptan (MAXALT-MLT) 10 MG disintegrating tablet, Take 1 tablet (10 mg total) by mouth as needed for migraine. May repeat in 2 hours if needed, Disp: 27 tablet, Rfl: 1   Semaglutide (RYBELSUS) 7 MG TABS, Take 7 mg by mouth daily., Disp: 30 tablet, Rfl: 1   Ubrogepant (UBRELVY) 100 MG TABS, Take 1 tablet by mouth daily as needed., Disp: 10 tablet, Rfl: 2   umeclidinium-vilanterol (ANORO ELLIPTA) 62.5-25 MCG/INH AEPB, Inhale 1 puff into the lungs daily., Disp: , Rfl:    Vitamin D, Ergocalciferol, (DRISDOL) 1.25 MG (50000 UNIT) CAPS capsule, TAKE 1 CAPSULE ONCE A WEEK, Disp: 12 capsule, Rfl: 1   Levonorgestrel  (LILETTA, 52 MG,) 19.5 MCG/DAY IUD IUD, 1 Intra Uterine Device (1 each total) by Intrauterine route once for 1 dose., Disp: 1 each, Rfl: 0  Allergies  Allergen Reactions   Dog Epithelium     Anaphylaxis with Rabbits.  Dogs, cats, birds cause swelling, extreme itching.   Dust Mite Extract    Pollen Extract    Soap    Tape    Tree Extract     I personally reviewed active problem list, medication list, allergies, family history, social history, health maintenance with the patient/caregiver today.   ROS  Ten systems reviewed and is negative except as mentioned in HPI   Objective  Virtual encounter, vitals not obtained.  There is no height or weight on file to calculate BMI.  Physical Exam  Awake, alert and oriented, dry cough , sounds nasally, and was wheezing but in no distress.   PHQ2/9: Depression screen [facility] 2/9 09/06/2021 07/22/2021 10/29/2020 06/23/2020 03/31/2020  Decreased Interest 1 2 3 1  0  Down, Depressed, Hopeless 1 2 2 1 1   PHQ - 2 Score 2 4 5 2 1   Altered sleeping 0 3 3 2 1   Tired, decreased energy 3 0 3 3 1   Change in appetite 3 2 2 2  0  Feeling bad or failure about yourself  0 1 2 1 1   Trouble concentrating 0 1 0 1 0  Moving slowly or fidgety/restless 1 0 1 0 0  Suicidal thoughts 0 0 1 0 0  PHQ-9 Score 9 11 17 11 4   Difficult doing work/chores - - Somewhat difficult Somewhat difficult -  Some recent data might be hidden   PHQ-2/9 Result is positive.    Fall Risk: Fall Risk  09/06/2021 07/22/2021 10/29/2020 06/23/2020 03/31/2020  Falls in the past year? 1 0 1 0 1  Number falls in past yr: 0 0 0 0 0  Injury with Fall? 0 0 0 0 0  Risk for fall due to : No Fall Risks No Fall Risks - - -  Follow up Falls prevention discussed Falls prevention discussed - - -     Assessment & Plan  1. URI, acute  - chlorpheniramine-HYDROcodone (TUSSIONEX PENNKINETIC ER) 10-8 MG/5ML SUER; Take 5 mLs by mouth every 12 (twelve) hours as needed.  Dispense: 140 mL; Refill: 0  2.  Moderate persistent asthma with exacerbation  - methylPREDNISolone (MEDROL DOSEPAK) 4 MG TBPK tablet; Take as directed  Dispense: 21 tablet; Refill: 0   I discussed the assessment and treatment plan with the patient. The patient was provided an opportunity to ask questions and all were answered. The patient agreed with the plan and demonstrated an understanding of the instructions.  The patient was advised to call back or seek an in-person evaluation if the symptoms worsen or if the condition fails to improve as anticipated.  I provided 15  minutes of non-face-to-face time during this encounter.

## 2021-09-08 ENCOUNTER — Other Ambulatory Visit: Payer: Self-pay | Admitting: Family Medicine

## 2021-09-08 DIAGNOSIS — J449 Chronic obstructive pulmonary disease, unspecified: Secondary | ICD-10-CM

## 2021-09-08 MED ORDER — ALBUTEROL SULFATE HFA 108 (90 BASE) MCG/ACT IN AERS: 2.0000 | INHALATION_SPRAY | Freq: Four times a day (QID) | RESPIRATORY_TRACT | 0 refills | Status: DC | PRN

## 2021-09-27 ENCOUNTER — Encounter: Payer: Self-pay | Admitting: Family Medicine

## 2021-10-25 NOTE — Progress Notes (Signed)
Name: Destiny Atkinson   MRN: [id number]    DOB: [birth year]   Date:10/26/2021       Progress Note  Subjective  Chief Complaint  Follow Up  HPI  Migraine : episodes not as frequent again down to less than once a month, last episode about 2 months ago Migraine is no ssociated nausea or vomiting, but is associated with photophobia. Migraine is described as a sharp pain over left eye and radiates to left temporal area and sometimes nuchal area. She is doing well.    HTN: we stopped Losartan back in June 2019 because of dizziness , she has been on HCTZ but since she now has DM, we will try resuming losartan at lower dose at 25 mg adding farxiga and monitor for dizziness   GAD/Major Depression: she left [employer] and started a new job at [employer] in [place] back in May 2017, she left [employer] March 2018 when she walked away from her job,  and was unemployed until 06/2017. 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 She has UV lights at home but only for her plants. Advised to get UV lamp for work Discussed adding a mood stabilizer but she would like to hold off for now   DMII:   she states she had labs done at work while in [facility] and fasting glucose was 140, A1C 09/22 was 6.7 %  today is down to 5.9 % . She denies polyphagia, but has polydipsia and has urinary frequency but she takes a diuretic. She has associated HTN, dyslipidemia and obesity. She tried Rybelsus, took it for 2 months but unable to tolerate side effects.    RLS/Peripheral Neuropathy: She has a need to move her legs  when sitting down, also states that at night her legs moves constantly. Numbness had been intermittent on hands and feet. She has noticed symptoms are getting worse, earlier in the evening, we will adjust dose to 0.25 to 0.5 mg    AR: she his allergic to cats, but has 3 cats at home, also allergic to dogs and has one of them,. She has itchy eyes and nose, also her throat . She has been taking loratadine, xyzal,. Stable    Asthma/COPD: she still smokes, about 10per day cigarettes daily. She states symptoms had improved with Trelegy but had to stop due to trush. She states she has cough but very mild , wheezing that is worse in am's . We gave her rx of Anoro but not able to fill it , we will try switching to [facility].    Dyslipidemia: reviewed last labs with patient , she would like to hold off on starting statin therapy    BPPV: seen by ENT and symptoms of dizziness has resolved .  Patient Active Problem List   Diagnosis Date Noted   Hypertension associated with type 2 diabetes mellitus ([place]) 10/26/2021   Moderate major depression ([place]) 10/26/2021   Vitamin D deficiency 12/28/2016   Mild major depression (  [place]) 11/17/2015   GAD (generalized anxiety disorder) 05/06/2015   Asthma, moderate persistent 05/06/2015   Mobitz type I incomplete atrioventricular block 05/06/2015   B12 deficiency 05/06/2015   Essential (primary) hypertension 05/06/2015   Irregular bleeding 05/06/2015   Migraine without aura and responsive to treatment 05/06/2015   BMI 31.0-31.9,adult 05/06/2015   Peripheral neuropathy 05/06/2015   Perennial allergic rhinitis 05/06/2015   Restless leg 05/06/2015   Tobacco abuse 05/06/2015    Past Surgical History:  Procedure Laterality Date   COLONOSCOPY     in 8th grade, not as adult   ESOPHAGOGASTRODUODENOSCOPY     age 81   NASAL SINUS SURGERY  01/21/2010   septoplasty/ sinus surgery   WISDOM TOOTH EXTRACTION  age 29    Family History  Problem Relation Age of Onset    COPD Mother    Fibromyalgia Mother    Hypertension Mother    Stroke Mother    Asthma Mother    Arthritis Mother    Depression Mother    Heart disease Mother    Crohn's disease Mother    Diabetes Father    Epilepsy Brother    Diabetes type II Daughter    Stomach cancer Paternal Grandmother 50   Ovarian cancer Other 65   Ovarian cancer Other 68   Diabetes type II Daughter    Kidney Stones Daughter    Bipolar disorder Maternal Grandmother     Social History   Tobacco Use   Smoking status: Every Day    Packs/day: 0.25    Years: 24.00    Pack years: 6.00    Types: Cigarettes, E-cigarettes    Start date: 06/29/1995   Smokeless tobacco: Never  Substance Use Topics   Alcohol use: Yes    Alcohol/week: 0.0 standard drinks    Comment: rarely     Current Outpatient Medications:    albuterol (VENTOLIN HFA) 108 (90 Base) MCG/ACT inhaler, Inhale 2 puffs into the lungs every 6 (six) hours as needed for wheezing or shortness of breath., Disp: 8 g, Rfl: 0   Budeson-Glycopyrrol-Formoterol (BREZTRI AEROSPHERE) 160-9-4.8 MCG/ACT AERO, Inhale 2 puffs into the lungs 2 (two) times daily., Disp: 32.1 g, Rfl: 1   dapagliflozin propanediol (FARXIGA) 10 MG TABS tablet, Take 1 tablet (10 mg total) by mouth daily., Disp: 90 tablet, Rfl: 1   hydrochlorothiazide (HYDRODIURIL) 12.5 MG tablet, Take 1 tablet (12.5 mg total) by mouth daily., Disp: 90 tablet, Rfl: 1   ipratropium-albuterol (DUONEB) 0.5-2.5 (3) MG/3ML SOLN, Take 3 mLs by nebulization every 6 (six) hours as needed., Disp: 360 mL, Rfl: 1   levocetirizine (XYZAL) 5 MG tablet, Take 1 tablet (5 mg total) by mouth daily., Disp: 90 tablet, Rfl: 1   loratadine (CLARITIN) 10 MG tablet, Take 10 mg by mouth daily., Disp: , Rfl:    losartan (COZAAR) 25 MG tablet, Take 1 tablet (25 mg total) by mouth daily., Disp: 90 tablet, Rfl: 0   rizatriptan (MAXALT-MLT) 10 MG disintegrating tablet, Take 1 tablet (10 mg total) by mouth as needed for migraine. May  repeat in 2 hours if needed, Disp: 27 tablet, Rfl: 1   Ubrogepant (UBRELVY) 100 MG TABS, Take 1 tablet by mouth daily as needed., Disp: 10 tablet, Rfl: 2   ALPRAZolam (XANAX) 0.5 MG tablet, Take 1 tablet (0.5 mg total) by mouth daily as needed for anxiety., Disp: 23 tablet, Rfl: 0   escitalopram (LEXAPRO) 20 MG tablet, Take 1 tablet (20 mg total) by mouth daily., Disp: 90 tablet,  Rfl: 1   hydrOXYzine (ATARAX) 25 MG tablet, Take 1 tablet (25 mg total) by mouth at bedtime as needed., Disp: 90 tablet, Rfl: 1   Levonorgestrel (LILETTA, 52 MG,) 19.5 MCG/DAY IUD IUD, 1 Intra Uterine Device (1 each total) by Intrauterine route once for 1 dose., Disp: 1 each, Rfl: 0   pramipexole (MIRAPEX) 0.5 MG tablet, Take 1 tablet (0.5 mg total) by mouth at bedtime., Disp: 90 tablet, Rfl: 1   Vitamin D, Ergocalciferol, (DRISDOL) 1.25 MG (50000 UNIT) CAPS capsule, TAKE 1 CAPSULE ONCE A WEEK, Disp: 12 capsule, Rfl: 1  Allergies  Allergen Reactions   Dog Epithelium     Anaphylaxis with Rabbits.  Dogs, cats, birds cause swelling, extreme itching.   Dust Mite Extract    Pollen Extract    Soap    Tape    Tree Extract     I personally reviewed active problem list, medication list, allergies, family history, social history, health maintenance with the patient/caregiver today.   ROS  Constitutional: Negative for fever or weight change.  Respiratory: Negative for cough and shortness of breath.   Cardiovascular: Negative for chest pain or palpitations.  Gastrointestinal: Negative for abdominal pain, no bowel changes.  Musculoskeletal: Negative for gait problem or joint swelling.  Skin: Negative for rash.  Neurological: Negative for dizziness or headache.  No other specific complaints in a complete review of systems (except as listed in HPI above).   Objective  Vitals:   10/26/21 0923  BP: 136/70  Pulse: (!) 108  Resp: 16  Temp: 98.2 F (36.8 C)  SpO2: 96%  Weight: 192 lb (87.1 kg)  Height: 5\' 6"  (1.676  m)    Body mass index is 30.99 kg/m.  Physical Exam  Constitutional: Patient appears well-developed and well-nourished. Obese  No distress.  HEENT: head atraumatic, normocephalic, pupils equal and reactive to light,  neck supple Cardiovascular: Normal rate, regular rhythm and normal heart sounds.  No murmur heard. No BLE edema. Pulmonary/Chest: Effort normal and breath sounds normal. No respiratory distress. Abdominal: Soft.  There is no tenderness. Psychiatric: Patient has a normal mood and affect. behavior is normal. Judgment and thought content normal.   PHQ2/9: Depression screen [facility] 2/9 10/26/2021 09/06/2021 07/22/2021 10/29/2020 06/23/2020  Decreased Interest 2 1 2 3 1   Down, Depressed, Hopeless 2 1 2 2 1   PHQ - 2 Score 4 2 4 5 2   Altered sleeping 2 0 3 3 2   Tired, decreased energy 2 3 0 3 3  Change in appetite 2 3 2 2 2   Feeling bad or failure about yourself  1 0 1 2 1   Trouble concentrating 1 0 1 0 1  Moving slowly or fidgety/restless 0 1 0 1 0  Suicidal thoughts 0 0 0 1 0  PHQ-9 Score 12 9 11 17 11   Difficult doing work/chores - - - Somewhat difficult Somewhat difficult  Some recent data might be hidden    phq 9 is positive   Fall Risk: Fall Risk  10/26/2021 09/06/2021 07/22/2021 10/29/2020 06/23/2020  Falls in the past year? 1 1 0 1 0  Number falls in past yr: 0 0 0 0 0  Injury with Fall? 0 0 0 0 0  Risk for fall due to : No Fall Risks No Fall Risks No Fall Risks - -  Follow up Falls prevention discussed Falls prevention discussed Falls prevention discussed - -      Functional Status Survey: Is the patient deaf or have difficulty  hearing?: No Does the patient have difficulty seeing, even when wearing glasses/contacts?: No Does the patient have difficulty concentrating, remembering, or making decisions?: Yes Does the patient have difficulty walking or climbing stairs?: No Does the patient have difficulty dressing or bathing?: No Does the patient have difficulty doing  errands alone such as visiting a doctor's office or shopping?: No    Assessment & Plan  1. Hypertension associated with type 2 diabetes mellitus (HCC)  - dapagliflozin propanediol (FARXIGA) 10 MG TABS tablet; Take 1 tablet (10 mg total) by mouth daily.  Dispense: 90 tablet; Refill: 1 - losartan (COZAAR) 25 MG tablet; Take 1 tablet (25 mg total) by mouth daily.  Dispense: 90 tablet; Refill: 0 - POCT UA - Microalbumin  2. GAD (generalized anxiety disorder)  - ALPRAZolam (XANAX) 0.5 MG tablet; Take 1 tablet (0.5 mg total) by mouth daily as needed for anxiety.  Dispense: 23 tablet; Refill: 0 - escitalopram (LEXAPRO) 20 MG tablet; Take 1 tablet (20 mg total) by mouth daily.  Dispense: 90 tablet; Refill: 1 - hydrOXYzine (ATARAX) 25 MG tablet; Take 1 tablet (25 mg total) by mouth at bedtime as needed.  Dispense: 90 tablet; Refill: 1  3. Moderate major depression (HCC)  - escitalopram (LEXAPRO) 20 MG tablet; Take 1 tablet (20 mg total) by mouth daily.  Dispense: 90 tablet; Refill: 1  4. Essential hypertension  - losartan (COZAAR) 25 MG tablet; Take 1 tablet (25 mg total) by mouth daily.  Dispense: 90 tablet; Refill: 0  5. COPD with asthma (HCC)  - Budeson-Glycopyrrol-Formoterol (BREZTRI AEROSPHERE) 160-9-4.8 MCG/ACT AERO; Inhale 2 puffs into the lungs 2 (two) times daily.  Dispense: 32.1 g; Refill: 1  6. Dyslipidemia   7. Migraine without aura and responsive to treatment   8. B12 deficiency   9. Vitamin D deficiency  - Vitamin D, Ergocalciferol, (DRISDOL) 1.25 MG (50000 UNIT) CAPS capsule; TAKE 1 CAPSULE ONCE A WEEK  Dispense: 12 capsule; Refill: 1  10. Peripheral polyneuropathy   11. RLS (restless legs syndrome)  - pramipexole (MIRAPEX) 0.5 MG tablet; Take 1 tablet (0.5 mg total) by mouth at bedtime.  Dispense: 90 tablet; Refill: 1  12. Dyslipidemia associated with type 2 diabetes mellitus (HCC)   13. Needs flu shot   She wants to hold off

## 2021-10-26 ENCOUNTER — Ambulatory Visit (INDEPENDENT_AMBULATORY_CARE_PROVIDER_SITE_OTHER): Payer: BC Managed Care – PPO | Admitting: Family Medicine

## 2021-10-26 ENCOUNTER — Encounter: Payer: Self-pay | Admitting: Family Medicine

## 2021-10-26 VITALS — BP 136/70 | HR 108 | Temp 98.2°F | Resp 16 | Ht 66.0 in | Wt 192.0 lb

## 2021-10-26 DIAGNOSIS — I152 Hypertension secondary to endocrine disorders: Secondary | ICD-10-CM | POA: Diagnosis not present

## 2021-10-26 DIAGNOSIS — J449 Chronic obstructive pulmonary disease, unspecified: Secondary | ICD-10-CM

## 2021-10-26 DIAGNOSIS — I10 Essential (primary) hypertension: Secondary | ICD-10-CM

## 2021-10-26 DIAGNOSIS — E1159 Type 2 diabetes mellitus with other circulatory complications: Secondary | ICD-10-CM | POA: Diagnosis not present

## 2021-10-26 DIAGNOSIS — G2581 Restless legs syndrome: Secondary | ICD-10-CM

## 2021-10-26 DIAGNOSIS — F321 Major depressive disorder, single episode, moderate: Secondary | ICD-10-CM

## 2021-10-26 DIAGNOSIS — E559 Vitamin D deficiency, unspecified: Secondary | ICD-10-CM

## 2021-10-26 DIAGNOSIS — G43009 Migraine without aura, not intractable, without status migrainosus: Secondary | ICD-10-CM

## 2021-10-26 DIAGNOSIS — F411 Generalized anxiety disorder: Secondary | ICD-10-CM

## 2021-10-26 DIAGNOSIS — E785 Hyperlipidemia, unspecified: Secondary | ICD-10-CM

## 2021-10-26 DIAGNOSIS — E1169 Type 2 diabetes mellitus with other specified complication: Secondary | ICD-10-CM

## 2021-10-26 DIAGNOSIS — Z23 Encounter for immunization: Secondary | ICD-10-CM

## 2021-10-26 DIAGNOSIS — E538 Deficiency of other specified B group vitamins: Secondary | ICD-10-CM

## 2021-10-26 DIAGNOSIS — G629 Polyneuropathy, unspecified: Secondary | ICD-10-CM

## 2021-10-26 LAB — POCT GLYCOSYLATED HEMOGLOBIN (HGB A1C): Hemoglobin A1C: 5.9 % — AB (ref 4.0–5.6)

## 2021-10-26 MED ORDER — ALPRAZOLAM 0.5 MG PO TABS: 0.5000 mg | ORAL_TABLET | Freq: Every day | ORAL | 0 refills | Status: DC | PRN

## 2021-10-26 MED ORDER — DAPAGLIFLOZIN PROPANEDIOL 10 MG PO TABS: 10.0000 mg | ORAL_TABLET | Freq: Every day | ORAL | 1 refills | Status: DC

## 2021-10-26 MED ORDER — BREZTRI AEROSPHERE 160-9-4.8 MCG/ACT IN AERO: 2.0000 | INHALATION_SPRAY | Freq: Two times a day (BID) | RESPIRATORY_TRACT | 1 refills | Status: DC

## 2021-10-26 MED ORDER — PRAMIPEXOLE DIHYDROCHLORIDE 0.5 MG PO TABS: 0.5000 mg | ORAL_TABLET | Freq: Every day | ORAL | 1 refills | Status: DC

## 2021-10-26 MED ORDER — VITAMIN D (ERGOCALCIFEROL) 1.25 MG (50000 UNIT) PO CAPS: ORAL_CAPSULE | ORAL | 1 refills | Status: DC

## 2021-10-26 MED ORDER — ESCITALOPRAM OXALATE 20 MG PO TABS: 20.0000 mg | ORAL_TABLET | Freq: Every day | ORAL | 1 refills | Status: DC

## 2021-10-26 MED ORDER — HYDROXYZINE HCL 25 MG PO TABS: 25.0000 mg | ORAL_TABLET | Freq: Every evening | ORAL | 1 refills | Status: DC | PRN

## 2021-10-26 MED ORDER — LOSARTAN POTASSIUM 25 MG PO TABS: 25.0000 mg | ORAL_TABLET | Freq: Every day | ORAL | 0 refills | Status: DC

## 2021-11-01 DIAGNOSIS — M9903 Segmental and somatic dysfunction of lumbar region: Secondary | ICD-10-CM | POA: Diagnosis not present

## 2021-11-01 DIAGNOSIS — M9901 Segmental and somatic dysfunction of cervical region: Secondary | ICD-10-CM | POA: Diagnosis not present

## 2021-11-01 DIAGNOSIS — M542 Cervicalgia: Secondary | ICD-10-CM | POA: Diagnosis not present

## 2021-11-01 DIAGNOSIS — M62830 Muscle spasm of back: Secondary | ICD-10-CM | POA: Diagnosis not present

## 2021-11-09 ENCOUNTER — Other Ambulatory Visit: Payer: Self-pay

## 2021-11-09 ENCOUNTER — Other Ambulatory Visit: Payer: Self-pay | Admitting: Family Medicine

## 2021-11-09 ENCOUNTER — Encounter: Payer: Self-pay | Admitting: Family Medicine

## 2021-11-09 DIAGNOSIS — T3695XA Adverse effect of unspecified systemic antibiotic, initial encounter: Secondary | ICD-10-CM

## 2021-11-09 DIAGNOSIS — B3731 Acute candidiasis of vulva and vagina: Secondary | ICD-10-CM

## 2021-11-09 MED ORDER — FLUCONAZOLE 150 MG PO TABS: 150.0000 mg | ORAL_TABLET | ORAL | 0 refills | Status: DC

## 2021-12-27 DIAGNOSIS — M62830 Muscle spasm of back: Secondary | ICD-10-CM | POA: Diagnosis not present

## 2021-12-27 DIAGNOSIS — M9901 Segmental and somatic dysfunction of cervical region: Secondary | ICD-10-CM | POA: Diagnosis not present

## 2021-12-27 DIAGNOSIS — M542 Cervicalgia: Secondary | ICD-10-CM | POA: Diagnosis not present

## 2021-12-27 DIAGNOSIS — M9903 Segmental and somatic dysfunction of lumbar region: Secondary | ICD-10-CM | POA: Diagnosis not present

## 2022-01-05 ENCOUNTER — Other Ambulatory Visit: Payer: Self-pay | Admitting: Family Medicine

## 2022-01-05 DIAGNOSIS — E1159 Type 2 diabetes mellitus with other circulatory complications: Secondary | ICD-10-CM

## 2022-01-05 DIAGNOSIS — I10 Essential (primary) hypertension: Secondary | ICD-10-CM

## 2022-01-06 ENCOUNTER — Other Ambulatory Visit: Payer: Self-pay

## 2022-01-06 DIAGNOSIS — J449 Chronic obstructive pulmonary disease, unspecified: Secondary | ICD-10-CM

## 2022-01-06 MED ORDER — ALBUTEROL SULFATE HFA 108 (90 BASE) MCG/ACT IN AERS: 2.0000 | INHALATION_SPRAY | Freq: Four times a day (QID) | RESPIRATORY_TRACT | 3 refills | Status: DC | PRN

## 2022-01-18 NOTE — Progress Notes (Signed)
Name: Destiny Atkinson   MRN: [id number]    DOB: [birth year]   Date:01/19/2022       Progress Note  Subjective  Chief Complaint  Follow Up  HPI  Migraine : episodes not as frequent again down to less than once a month, last episode over one month ago Migraine is not ssociated nausea or vomiting, but is associated with photophobia. Migraine is described as a sharp pain over left eye and radiates to left temporal area and sometimes nuchal area. Takes Maxalt first and sometimes Ubrelvy    HTN: currently of HCTZ only taking low dose losartan and bp is at goal, no chest pain , only has palpitation with panic attacks. Denies dizziness    GAD/Major Depression: she left [employer] and started a new job at [employer] in [place] back in May 2017, she left [employer] March 2018 when she walked away from her job,  and was unemployed until 06/2017. 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 She had one panic attack during the day for the first time, took alprazolam, she still has 8 pills left at home, we will try going down from 23 pills to 18 pills to last 3 months   DMII:   she states she had labs done at work while in [facility] and fasting glucose was 140, A1C 09/22 was 6.7 %  but went to  5.9 % . She denies polyphagia, but has polydipsia and has urinary frequency but she takes a diuretic. She has associated HTN, dyslipidemia and obesity. She tried Rybelsus, took it for 2 months but unable to tolerate nausea, we gave her Wilder Glade but stopped taking it Dec due to recurrent yeast infections  She has changed her diet     RLS/Peripheral Neuropathy: She has a need to move her legs when sitting down, also states that at night her legs moves constantly. Numbness had been intermittent on hands and feet. She was on Requip but stopped working after 6 months, she has failed gabapentin, currently on Mirapex but takes hours to starts to work. She would like to go back on Requip    AR: she his allergic to cats, but has 3 cats at home, also allergic to dogs and has one of them,. She has itchy eyes and nose, also her throat , she states also worse during Spring . She has been taking loratadine, xyzal,. Stable    Asthma/COPD: she still smokes, about 10 per day cigarettes daily. She states symptoms had improved with Trelegy but had to stop due to trush. 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  Vitamin D deficiency: she was not taking rx vitamin when we did the test   Patient Active  Problem List   Diagnosis Date Noted   Hypertension associated with type 2 diabetes mellitus ([place]) 10/26/2021   Moderate major depression ([place]) 10/26/2021   Vitamin D deficiency 12/28/2016   Mild major depression ([place]) 11/17/2015   GAD (generalized anxiety disorder) 05/06/2015   Asthma, moderate persistent 05/06/2015   Mobitz type I incomplete atrioventricular block 05/06/2015   B12 deficiency 05/06/2015   Essential (primary) hypertension 05/06/2015   Irregular bleeding 05/06/2015   Migraine without aura and responsive to treatment 05/06/2015   BMI 31.0-31.9,adult 05/06/2015   Peripheral neuropathy 05/06/2015   Perennial allergic rhinitis 05/06/2015   Restless leg 05/06/2015   Tobacco abuse 05/06/2015    Past Surgical History:  Procedure Laterality Date   COLONOSCOPY      in 8th grade, not as adult   ESOPHAGOGASTRODUODENOSCOPY     age 46   NASAL SINUS SURGERY  01/21/2010   septoplasty/ sinus surgery   WISDOM TOOTH EXTRACTION  age 46    Family History  Problem Relation Age of Onset   COPD Mother    Fibromyalgia Mother    Hypertension Mother    Stroke Mother    Asthma Mother    Arthritis Mother    Depression Mother    Heart disease Mother    Crohn's disease Mother    Diabetes Father    Epilepsy Brother    Diabetes type II Daughter    Stomach cancer Paternal Grandmother 18   Ovarian cancer Other 73   Ovarian cancer Other 63   Diabetes type II Daughter    Kidney Stones Daughter    Bipolar disorder Maternal Grandmother     Social History   Tobacco Use   Smoking status: Every Day    Packs/day: 0.25    Years: 24.00    Pack years: 6.00    Types: Cigarettes, E-cigarettes    Start date: 06/29/1995   Smokeless tobacco: Never  Substance Use Topics   Alcohol use: Yes    Alcohol/week: 0.0 standard drinks    Comment: rarely     Current Outpatient Medications:    albuterol (VENTOLIN HFA) 108 (90 Base) MCG/ACT inhaler, Inhale 2 puffs into the lungs every 6 (six) hours as needed for wheezing or shortness of breath., Disp: 8 g, Rfl: 3   ALPRAZolam (XANAX) 0.5 MG tablet, Take 1 tablet (0.5 mg total) by mouth daily as needed for anxiety., Disp: 23 tablet, Rfl: 0   Budeson-Glycopyrrol-Formoterol (BREZTRI AEROSPHERE) 160-9-4.8 MCG/ACT AERO, Inhale 2 puffs into the lungs 2 (two) times daily., Disp: 32.1 g, Rfl: 1   escitalopram (LEXAPRO) 20 MG tablet, Take 1 tablet (20 mg total) by mouth daily., Disp: 90 tablet, Rfl: 1   fluconazole (DIFLUCAN) 150 MG tablet, Take 1 tablet (150 mg total) by mouth every other day., Disp: 3 tablet, Rfl: 0   hydrOXYzine (ATARAX) 25 MG tablet, Take 1 tablet (25 mg total) by mouth at bedtime as needed., Disp: 90 tablet, Rfl: 1   ipratropium-albuterol (DUONEB) 0.5-2.5 (3) MG/3ML SOLN, Take 3 mLs by nebulization every 6 (six)  hours as needed., Disp: 360 mL, Rfl: 1   levocetirizine (XYZAL) 5 MG tablet, Take 1 tablet (5 mg total) by mouth daily., Disp: 90 tablet, Rfl: 1   loratadine (CLARITIN) 10 MG tablet, Take 10 mg by mouth daily., Disp: , Rfl:    losartan (COZAAR) 25 MG tablet, TAKE 1 TABLET DAILY, Disp: 90 tablet, Rfl: 0   pramipexole (MIRAPEX) 0.5 MG tablet, Take 1 tablet (0.5 mg total) by mouth at  bedtime., Disp: 90 tablet, Rfl: 1   rizatriptan (MAXALT-MLT) 10 MG disintegrating tablet, Take 1 tablet (10 mg total) by mouth as needed for migraine. May repeat in 2 hours if needed, Disp: 27 tablet, Rfl: 1   Ubrogepant (UBRELVY) 100 MG TABS, Take 1 tablet by mouth daily as needed., Disp: 10 tablet, Rfl: 2   Vitamin D, Ergocalciferol, (DRISDOL) 1.25 MG (50000 UNIT) CAPS capsule, TAKE 1 CAPSULE ONCE A WEEK, Disp: 12 capsule, Rfl: 1   Levonorgestrel (LILETTA, 52 MG,) 19.5 MCG/DAY IUD IUD, 1 Intra Uterine Device (1 each total) by Intrauterine route once for 1 dose., Disp: 1 each, Rfl: 0  Allergies  Allergen Reactions   Dog Epithelium     Anaphylaxis with Rabbits.  Dogs, cats, birds cause swelling, extreme itching.   Dust Mite Extract    Pollen Extract    Soap    Tape    Tree Extract     I personally reviewed active problem list, medication list, allergies, family history, social history, health maintenance with the patient/caregiver today.   ROS  Constitutional: Negative for fever or weight change.  Respiratory: Negative for cough and shortness of breath.   Cardiovascular: Negative for chest pain or palpitations.  Gastrointestinal: Negative for abdominal pain, no bowel changes.  Musculoskeletal: Negative for gait problem or joint swelling.  Skin: Negative for rash.  Neurological: Negative for dizziness or headache.  No other specific complaints in a complete review of systems (except as listed in HPI above).   Objective  Vitals:   01/19/22 0757  BP: 138/74  Pulse: 99  Resp: 18  Temp: 98 F (36.7 C)   TempSrc: Oral  SpO2: 100%  Weight: 195 lb 11.2 oz (88.8 kg)  Height: 5\' 7"  (1.702 m)    Body mass index is 30.65 kg/m.  Physical Exam  Constitutional: Patient appears well-developed and well-nourished. Obese  No distress.  HEENT: head atraumatic, normocephalic, pupils equal and reactive to light, neck supple Cardiovascular: Normal rate, regular rhythm and normal heart sounds.  No murmur heard. No BLE edema. Pulmonary/Chest: Effort normal and breath sounds normal. No respiratory distress. Abdominal: Soft.  There is no tenderness. Psychiatric: Patient has a normal mood and affect. behavior is normal. Judgment and thought content normal.   Recent Results (from the past 2160 hour(s))  POCT HgB A1C     Status: Abnormal   Collection Time: 10/26/21 10:28 AM  Result Value Ref Range   Hemoglobin A1C 5.9 (A) 4.0 - 5.6 %   HbA1c POC (<> result, manual entry)     HbA1c, POC (prediabetic range)     HbA1c, POC (controlled diabetic range)       PHQ2/9: Depression screen [facility] 2/9 01/19/2022 10/26/2021 09/06/2021 07/22/2021 10/29/2020  Decreased Interest 1 2 1 2 3   Down, Depressed, Hopeless 1 2 1 2 2   PHQ - 2 Score 2 4 2 4 5   Altered sleeping 1 2 0 3 3  Tired, decreased energy 2 2 3  0 3  Change in appetite 2 2 3 2 2   Feeling bad or failure about yourself  2 1 0 1 2  Trouble concentrating 0 1 0 1 0  Moving slowly or fidgety/restless 0 0 1 0 1  Suicidal thoughts 0 0 0 0 1  PHQ-9 Score 9 12 9 11 17   Difficult doing work/chores Somewhat difficult - - - Somewhat difficult  Some recent data might be hidden    phq 9 is positive   Fall Risk: Fall Risk  01/19/2022 10/26/2021 09/06/2021 07/22/2021 10/29/2020  Falls in the past year? 1 1 1  0 1  Number falls in past yr: 0 0 0 0 0  Injury with Fall? 0 0 0 0 0  Risk for fall due to : History of fall(s) No Fall Risks No Fall Risks No Fall Risks -  Follow up Falls evaluation completed;Education provided Falls prevention discussed Falls prevention discussed  Falls prevention discussed -      Functional Status Survey: Is the patient deaf or have difficulty hearing?: No Does the patient have difficulty seeing, even when wearing glasses/contacts?: Yes Does the patient have difficulty concentrating, remembering, or making decisions?: Yes Does the patient have difficulty walking or climbing stairs?: Yes Does the patient have difficulty dressing or bathing?: No Does the patient have difficulty doing errands alone such as visiting a doctor's office or shopping?: No    Assessment & Plan  1. Hypertension associated with type 2 diabetes mellitus (HCC)  - Microalbumin / creatinine urine ratio - CBC with Differential/Platelet - Hemoglobin A1c - losartan (COZAAR) 25 MG tablet; Take 1 tablet (25 mg total) by mouth daily.  Dispense: 90 tablet; Refill: 0  2. Diabetes mellitus type 2 in obese ([place])   3. GAD (generalized anxiety disorder)  - ALPRAZolam (XANAX) 0.5 MG tablet; Take 1 tablet (0.5 mg total) by mouth daily as needed for anxiety.  Dispense: 18 tablet; Refill: 0  4. B12 deficiency  - CBC with Differential/Platelet  5. Moderate major depression (HCC)  Stable on medication  6. Dyslipidemia  Not interested in statin therapy   7. Vitamin D deficiency   8. RLS (restless legs syndrome)  We will go back to Requip 1 mg  9. Essential hypertension  - losartan (COZAAR) 25 MG tablet; Take 1 tablet (25 mg total) by mouth daily.  Dispense: 90 tablet; Refill: 0  10. Migraine without aura and responsive to treatment   11. Peripheral polyneuropathy   12. COPD with asthma ([place])    Discussed singulair but she would like to hold off on adding medications at this time

## 2022-01-19 ENCOUNTER — Ambulatory Visit (INDEPENDENT_AMBULATORY_CARE_PROVIDER_SITE_OTHER): Payer: BC Managed Care – PPO | Admitting: Family Medicine

## 2022-01-19 ENCOUNTER — Encounter: Payer: Self-pay | Admitting: Family Medicine

## 2022-01-19 VITALS — BP 138/74 | HR 99 | Temp 98.0°F | Resp 18 | Ht 67.0 in | Wt 195.7 lb

## 2022-01-19 DIAGNOSIS — J449 Chronic obstructive pulmonary disease, unspecified: Secondary | ICD-10-CM

## 2022-01-19 DIAGNOSIS — E785 Hyperlipidemia, unspecified: Secondary | ICD-10-CM

## 2022-01-19 DIAGNOSIS — E559 Vitamin D deficiency, unspecified: Secondary | ICD-10-CM

## 2022-01-19 DIAGNOSIS — G43009 Migraine without aura, not intractable, without status migrainosus: Secondary | ICD-10-CM

## 2022-01-19 DIAGNOSIS — I152 Hypertension secondary to endocrine disorders: Secondary | ICD-10-CM

## 2022-01-19 DIAGNOSIS — E669 Obesity, unspecified: Secondary | ICD-10-CM

## 2022-01-19 DIAGNOSIS — E538 Deficiency of other specified B group vitamins: Secondary | ICD-10-CM | POA: Diagnosis not present

## 2022-01-19 DIAGNOSIS — F411 Generalized anxiety disorder: Secondary | ICD-10-CM | POA: Diagnosis not present

## 2022-01-19 DIAGNOSIS — E1159 Type 2 diabetes mellitus with other circulatory complications: Secondary | ICD-10-CM | POA: Diagnosis not present

## 2022-01-19 DIAGNOSIS — E1169 Type 2 diabetes mellitus with other specified complication: Secondary | ICD-10-CM | POA: Diagnosis not present

## 2022-01-19 DIAGNOSIS — G2581 Restless legs syndrome: Secondary | ICD-10-CM

## 2022-01-19 DIAGNOSIS — J4489 Other specified chronic obstructive pulmonary disease: Secondary | ICD-10-CM | POA: Insufficient documentation

## 2022-01-19 DIAGNOSIS — I10 Essential (primary) hypertension: Secondary | ICD-10-CM

## 2022-01-19 DIAGNOSIS — G629 Polyneuropathy, unspecified: Secondary | ICD-10-CM

## 2022-01-19 DIAGNOSIS — F321 Major depressive disorder, single episode, moderate: Secondary | ICD-10-CM

## 2022-01-19 MED ORDER — LOSARTAN POTASSIUM 25 MG PO TABS: 25.0000 mg | ORAL_TABLET | Freq: Every day | ORAL | 0 refills | Status: DC

## 2022-01-19 MED ORDER — ROPINIROLE HCL 1 MG PO TABS
1.0000 mg | ORAL_TABLET | Freq: Every day | ORAL | 0 refills | Status: DC
Start: 2022-01-19 — End: 2022-06-27

## 2022-01-19 MED ORDER — ALPRAZOLAM 0.5 MG PO TABS: 0.5000 mg | ORAL_TABLET | Freq: Every day | ORAL | 0 refills | Status: DC | PRN

## 2022-01-20 LAB — CBC WITH DIFFERENTIAL/PLATELET
Absolute Monocytes: 531 cells/uL (ref 200–950)
Basophils Absolute: 62 cells/uL (ref 0–200)
Basophils Relative: 0.9 %
Eosinophils Absolute: 152 cells/uL (ref 15–500)
Eosinophils Relative: 2.2 %
HCT: 40 % (ref 35.0–45.0)
Hemoglobin: 13.3 g/dL (ref 11.7–15.5)
Lymphs Abs: 2263 cells/uL (ref 850–3900)
MCH: 30.2 pg (ref 27.0–33.0)
MCHC: 33.3 g/dL (ref 32.0–36.0)
MCV: 90.9 fL (ref 80.0–100.0)
MPV: 9.8 fL (ref 7.5–12.5)
Monocytes Relative: 7.7 %
Neutro Abs: 3892 cells/uL (ref 1500–7800)
Neutrophils Relative %: 56.4 %
Platelets: 258 10*3/uL (ref 140–400)
RBC: 4.4 10*6/uL (ref 3.80–5.10)
RDW: 14.5 % (ref 11.0–15.0)
Total Lymphocyte: 32.8 %
WBC: 6.9 10*3/uL (ref 3.8–10.8)

## 2022-01-20 LAB — HEMOGLOBIN A1C
Hgb A1c MFr Bld: 6.4 % of total Hgb — ABNORMAL HIGH (ref <5.7)
Mean Plasma Glucose: 137 mg/dL
eAG (mmol/L): 7.6 mmol/L

## 2022-01-20 LAB — MICROALBUMIN / CREATININE URINE RATIO
Creatinine, Urine: 183 mg/dL (ref 20–275)
Microalb Creat Ratio: 3 mcg/mg creat (ref <30)
Microalb, Ur: 0.6 mg/dL

## 2022-01-31 DIAGNOSIS — M9903 Segmental and somatic dysfunction of lumbar region: Secondary | ICD-10-CM | POA: Diagnosis not present

## 2022-01-31 DIAGNOSIS — M542 Cervicalgia: Secondary | ICD-10-CM | POA: Diagnosis not present

## 2022-01-31 DIAGNOSIS — M62830 Muscle spasm of back: Secondary | ICD-10-CM | POA: Diagnosis not present

## 2022-01-31 DIAGNOSIS — M9901 Segmental and somatic dysfunction of cervical region: Secondary | ICD-10-CM | POA: Diagnosis not present

## 2022-02-02 ENCOUNTER — Encounter: Payer: Self-pay | Admitting: Family Medicine

## 2022-02-03 ENCOUNTER — Other Ambulatory Visit: Payer: Self-pay | Admitting: Family Medicine

## 2022-02-03 MED ORDER — ALBUTEROL SULFATE HFA 108 (90 BASE) MCG/ACT IN AERS: 2.0000 | INHALATION_SPRAY | Freq: Four times a day (QID) | RESPIRATORY_TRACT | 0 refills | Status: DC | PRN

## 2022-02-07 ENCOUNTER — Other Ambulatory Visit: Payer: Self-pay | Admitting: Family Medicine

## 2022-02-07 MED ORDER — ALBUTEROL SULFATE HFA 108 (90 BASE) MCG/ACT IN AERS: 2.0000 | INHALATION_SPRAY | Freq: Four times a day (QID) | RESPIRATORY_TRACT | 1 refills | Status: DC | PRN

## 2022-03-09 NOTE — Progress Notes (Deleted)
Name: Destiny Atkinson   MRN: [id number]    DOB: August 21, 1976   Date:03/09/2022       Progress Note  Subjective  Chief Complaint  Annual Exam  HPI  Patient presents for annual CPE.  Diet: *** Exercise: ***   Flowsheet Row Office Visit from 10/29/2020 in [facility]  AUDIT-C Score 0      Depression: Phq 9 is  {Desc; negative/positive:13464}    01/19/2022    7:53 AM 10/26/2021    9:22 AM 09/06/2021    9:00 AM 07/22/2021    9:08 AM 10/29/2020   11:34 AM  Depression screen PHQ 2/9  Decreased Interest _0 Down, Depressed, Hopeless _1 PHQ - 2 Score _2 Altered sleeping 1 2 0 3 3  Tired, decreased energy _3 0 3  Change in appetite _4 Feeling bad or failure about yourself  2 1 0 1 2  Trouble concentrating 0 1 0 1 0  Moving slowly or fidgety/restless 0 0 1 0 1  Suicidal thoughts 0 0 0 0 1  PHQ-9 Score _5 Difficult doing work/chores Somewhat difficult    Somewhat difficult   Hypertension: BP Readings from Last 3 Encounters:  01/19/22 138/74  10/26/21 136/70  07/22/21 128/74   Obesity: Wt Readings from Last 3 Encounters:  01/19/22 195 lb 11.2 oz (88.8 kg)  10/26/21 192 lb (87.1 kg)  07/22/21 200 lb (90.7 kg)   BMI Readings from Last 3 Encounters:  01/19/22 30.65 kg/m  10/26/21 30.99 kg/m  07/22/21 32.28 kg/m     Vaccines:   HPV: N/A Tdap: up to date Shingrix: N/A Pneumonia: N/A Flu: 2019 COVID-19: N/A   Hep C Screening: 10/29/20 STD testing and prevention (HIV/chl/gon/syphilis): 06/28/96 Intimate partner violence: negative screen  Sexual History : Menstrual History/LMP/Abnormal Bleeding:  Discussed importance of follow up if any post-menopausal bleeding: {Response; yes/no/na:63}  Incontinence Symptoms: {Desc; negative/positive:13464} for symptoms   Breast cancer:  - Last Mammogram: N/A - BRCA gene screening: N/A  Osteoporosis Prevention : Discussed high calcium and vitamin D supplementation,  weight bearing exercises Bone density :not applicable   Cervical cancer screening: Ordered today  Skin cancer: Discussed monitoring for atypical lesions  Colorectal cancer: N/A   Lung cancer:  Low Dose CT Chest recommended if Age 25-80 years, 20 pack-year currently smoking OR have quit w/in 15years. Patient does not qualify for screen   ECG: N/A  Advanced Care Planning: A voluntary discussion about advance care planning including the explanation and discussion of advance directives.  Discussed health care proxy and Living will, and the patient was able to identify a health care proxy as ***.  Patient does not have a living will and power of attorney of health care   Lipids: Lab Results  Component Value Date   CHOL 196 07/22/2021   CHOL 202 (H) 10/29/2020   CHOL 203 (H) 12/20/2019   Lab Results  Component Value Date   HDL 44 (L) 07/22/2021   HDL 47 10/29/2020   HDL 42 (L) 12/20/2019   Lab Results  Component Value Date   LDLCALC 114 (H) 07/22/2021   LDLCALC 113 (H) 10/29/2020   LDLCALC 105 (H) 12/20/2019   Lab Results  Component Value Date   TRIG 263 (H) 07/22/2021   TRIG 243 (H) 10/29/2020   TRIG 389 (H) 12/20/2019   Lab Results  Component Value Date   CHOLHDL 4.5 07/22/2021   CHOLHDL 4.3 10/29/2020   CHOLHDL 4.8 12/20/2019   No results found for: LDLDIRECT  Glucose: Glucose  Date Value Ref Range Status  10/29/2020 84 65 - 99 mg/dL Final   Glucose, Bld  Date Value Ref Range Status  07/22/2021 146 (H) 65 - 139 mg/dL Final    Comment:    .        Non-fasting reference interval .   12/20/2019 133 (H) 65 - 99 mg/dL Final    Comment:    .            Fasting reference interval . For someone without known diabetes, a glucose value >125 mg/dL indicates that they may have diabetes and this should be confirmed with a follow-up test. .   05/11/2018 90 65 - 139 mg/dL Final    Comment:    .        Non-fasting reference interval .     Patient Active  Problem List   Diagnosis Date Noted   COPD with asthma ([place]) 01/19/2022   Hypertension associated with type 2 diabetes mellitus ([place]) 10/26/2021   Moderate major depression ([place]) 10/26/2021   Vitamin D deficiency 12/28/2016   Mild major depression ([place]) 11/17/2015   GAD (generalized anxiety disorder) 05/06/2015   Asthma, moderate persistent 05/06/2015   Mobitz type I incomplete atrioventricular block 05/06/2015   B12 deficiency 05/06/2015   Essential (primary) hypertension 05/06/2015   Irregular bleeding 05/06/2015   Migraine without aura and responsive to treatment 05/06/2015   BMI 31.0-31.9,adult 05/06/2015   Peripheral neuropathy 05/06/2015   Perennial allergic rhinitis 05/06/2015   Restless leg 05/06/2015   Tobacco abuse 05/06/2015    Past Surgical History:  Procedure Laterality Date   COLONOSCOPY     in 8th grade, not as adult   ESOPHAGOGASTRODUODENOSCOPY     age 41   NASAL SINUS SURGERY  01/21/2010   septoplasty/ sinus surgery   WISDOM TOOTH EXTRACTION  age 71    Family History  Problem Relation Age of Onset   COPD Mother    Fibromyalgia Mother    Hypertension Mother    Stroke Mother    Asthma Mother    Arthritis Mother    Depression Mother    Heart disease Mother    Crohn's disease Mother    Diabetes Father    Epilepsy Brother    Diabetes type II Daughter    Stomach cancer Paternal Grandmother 50   Ovarian cancer Other 19   Ovarian cancer Other 94   Diabetes type II Daughter    Kidney Stones Daughter    Bipolar disorder Maternal Grandmother     Social History   Socioeconomic History   Marital status: Married    Spouse name: Quillian Quince   Number of children: 2   Years of education: 14   Highest education level: Some college, no degree  Occupational History   Occupation: Animator: [employer]  Tobacco Use   Smoking status: Every Day    Packs/day: 0.25    Years: 24.00    Pack years: 6.00    Types: Cigarettes,  E-cigarettes    Start date: 06/29/1995   Smokeless tobacco: Never  Vaping Use   Vaping Use: Former   Start date: 02/14/2015   Quit date: 11/15/2018  Substance and Sexual Activity   Alcohol use: Yes    Alcohol/week: 0.0 standard drinks    Comment: rarely  Drug use: No   Sexual activity: Yes    Partners: Male    Birth control/protection: I.U.D.  Other Topics Concern   Not on file  Social History Narrative   She is married, has two children   She is now working at [employer] Strain: Not on file  Food Insecurity: Not on file  Transportation Needs: Not on file  Physical Activity: Sufficiently Active   Days of Exercise per Week: 5 days   Minutes of Exercise per Session: 40 min  Stress: Not on file  Social Connections: Not on file  Intimate Partner Violence: Not on file     Current Outpatient Medications:    albuterol (PROAIR HFA) 108 (90 Base) MCG/ACT inhaler, Inhale 2 puffs into the lungs every 6 (six) hours as needed for wheezing or shortness of breath., Disp: 24 g, Rfl: 1   ALPRAZolam (XANAX) 0.5 MG tablet, Take 1 tablet (0.5 mg total) by mouth daily as needed for anxiety., Disp: 18 tablet, Rfl: 0   Budeson-Glycopyrrol-Formoterol (BREZTRI AEROSPHERE) 160-9-4.8 MCG/ACT AERO, Inhale 2 puffs into the lungs 2 (two) times daily., Disp: 32.1 g, Rfl: 1   escitalopram (LEXAPRO) 20 MG tablet, Take 1 tablet (20 mg total) by mouth daily., Disp: 90 tablet, Rfl: 1   hydrOXYzine (ATARAX) 25 MG tablet, Take 1 tablet (25 mg total) by mouth at bedtime as needed., Disp: 90 tablet, Rfl: 1   ipratropium-albuterol (DUONEB) 0.5-2.5 (3) MG/3ML SOLN, Take 3 mLs by nebulization every 6 (six) hours as needed., Disp: 360 mL, Rfl: 1   levocetirizine (XYZAL) 5 MG tablet, Take 1 tablet (5 mg total) by mouth daily., Disp: 90 tablet, Rfl: 1   Levonorgestrel (LILETTA, 52 MG,) 19.5 MCG/DAY IUD IUD, 1 Intra Uterine Device (1  each total) by Intrauterine route once for 1 dose., Disp: 1 each, Rfl: 0   loratadine (CLARITIN) 10 MG tablet, Take 10 mg by mouth daily., Disp: , Rfl:    losartan (COZAAR) 25 MG tablet, Take 1 tablet (25 mg total) by mouth daily., Disp: 90 tablet, Rfl: 0   rizatriptan (MAXALT-MLT) 10 MG disintegrating tablet, Take 1 tablet (10 mg total) by mouth as needed for migraine. May repeat in 2 hours if needed, Disp: 27 tablet, Rfl: 1   rOPINIRole (REQUIP) 1 MG tablet, Take 1 tablet (1 mg total) by mouth at bedtime., Disp: 90 tablet, Rfl: 0   Ubrogepant (UBRELVY) 100 MG TABS, Take 1 tablet by mouth daily as needed., Disp: 10 tablet, Rfl: 2   Vitamin D, Ergocalciferol, (DRISDOL) 1.25 MG (50000 UNIT) CAPS capsule, TAKE 1 CAPSULE ONCE A WEEK, Disp: 12 capsule, Rfl: 1  Allergies  Allergen Reactions   Dog Epithelium     Anaphylaxis with Rabbits.  Dogs, cats, birds cause swelling, extreme itching.   Dust Mite Extract    Pollen Extract    Soap    Tape    Tree Extract      ROS  ***  Objective  There were no vitals filed for this visit.  There is no height or weight on file to calculate BMI.  Physical Exam ***  Recent Results (from the past 2160 hour(s))  Microalbumin / creatinine urine ratio     Status: None   Collection Time: 01/19/22  8:30 AM  Result Value Ref Range   Creatinine, Urine 183 20 - 275 mg/dL   Microalb, Ur 0.6 mg/dL    Comment: Reference  Range Not established    Microalb Creat Ratio 3 <30 mcg/mg creat    Comment: . The ADA defines abnormalities in albumin excretion as follows: Marland Kitchen Albuminuria Category        Result (mcg/mg creatinine) . Normal to Mildly increased   <30 Moderately increased         30-299  Severely increased           > OR = 300 . The ADA recommends that at least two of three specimens collected within a 3-6 month period be abnormal before considering a patient to be within a diagnostic category.   CBC with Differential/Platelet     Status: None    Collection Time: 01/19/22  8:30 AM  Result Value Ref Range   WBC 6.9 3.8 - 10.8 Thousand/uL   RBC 4.40 3.80 - 5.10 Million/uL   Hemoglobin 13.3 11.7 - 15.5 g/dL   HCT 40.0 35.0 - 45.0 %   MCV 90.9 80.0 - 100.0 fL   MCH 30.2 27.0 - 33.0 pg   MCHC 33.3 32.0 - 36.0 g/dL   RDW 14.5 11.0 - 15.0 %   Platelets 258 140 - 400 Thousand/uL   MPV 9.8 7.5 - 12.5 fL   Neutro Abs 3,892 1,500 - 7,800 cells/uL   Lymphs Abs 2,263 850 - 3,900 cells/uL   Absolute Monocytes 531 200 - 950 cells/uL   Eosinophils Absolute 152 15 - 500 cells/uL   Basophils Absolute 62 0 - 200 cells/uL   Neutrophils Relative % 56.4 %   Total Lymphocyte 32.8 %   Monocytes Relative 7.7 %   Eosinophils Relative 2.2 %   Basophils Relative 0.9 %  Hemoglobin A1c     Status: Abnormal   Collection Time: 01/19/22  8:30 AM  Result Value Ref Range   Hgb A1c MFr Bld 6.4 (H) <5.7 % of total Hgb    Comment: For someone without known diabetes, a hemoglobin  A1c value between 5.7% and 6.4% is consistent with prediabetes and should be confirmed with a  follow-up test. . For someone with known diabetes, a value <7% indicates that their diabetes is well controlled. A1c targets should be individualized based on duration of diabetes, age, comorbid conditions, and other considerations. . This assay result is consistent with an increased risk of diabetes. . Currently, no consensus exists regarding use of hemoglobin A1c for diagnosis of diabetes for children. .    Mean Plasma Glucose 137 mg/dL   eAG (mmol/L) 7.6 mmol/L     Fall Risk:    01/19/2022    7:53 AM 10/26/2021    9:18 AM 09/06/2021    9:00 AM 07/22/2021    9:08 AM 10/29/2020   11:33 AM  Fall Risk   Falls in the past year? _0 0 1  Number falls in past yr: 0 0 0 0 0  Injury with Fall? 0 0 0 0 0  Risk for fall due to : History of fall(s) No Fall Risks No Fall Risks No Fall Risks   Follow up Falls evaluation completed;Education provided Falls prevention discussed  Falls prevention discussed Falls prevention discussed      Functional Status Survey:     Assessment & Plan  1. Well adult exam ***   -USPSTF grade A and B recommendations reviewed with patient; age-appropriate recommendations, preventive care, screening tests, etc discussed and encouraged; healthy living encouraged; see AVS for patient education given to patient -Discussed importance of 150 minutes of physical activity weekly, eat  two servings of fish weekly, eat one serving of tree nuts ( cashews, pistachios, pecans, almonds.Marland Kitchen) every other day, eat 6 servings of fruit/vegetables daily and drink plenty of water and avoid sweet beverages.   -Reviewed Health Maintenance: {yes [id number]}

## 2022-03-09 NOTE — Patient Instructions (Incomplete)

## 2022-03-10 ENCOUNTER — Encounter: Payer: BC Managed Care – PPO | Admitting: Family Medicine

## 2022-03-10 DIAGNOSIS — Z124 Encounter for screening for malignant neoplasm of cervix: Secondary | ICD-10-CM

## 2022-03-10 DIAGNOSIS — Z0000 Encounter for general adult medical examination without abnormal findings: Secondary | ICD-10-CM

## 2022-03-21 ENCOUNTER — Other Ambulatory Visit: Payer: Self-pay | Admitting: Family Medicine

## 2022-03-21 DIAGNOSIS — E559 Vitamin D deficiency, unspecified: Secondary | ICD-10-CM

## 2022-04-01 ENCOUNTER — Other Ambulatory Visit: Payer: Self-pay | Admitting: Family Medicine

## 2022-04-01 DIAGNOSIS — F411 Generalized anxiety disorder: Secondary | ICD-10-CM

## 2022-04-03 ENCOUNTER — Encounter: Payer: Self-pay | Admitting: Family Medicine

## 2022-04-05 ENCOUNTER — Other Ambulatory Visit: Payer: Self-pay | Admitting: Family Medicine

## 2022-04-05 DIAGNOSIS — B370 Candidal stomatitis: Secondary | ICD-10-CM

## 2022-04-05 MED ORDER — NYSTATIN 100000 UNIT/ML MT SUSP: 5.0000 mL | Freq: Four times a day (QID) | OROMUCOSAL | 0 refills | Status: DC

## 2022-04-21 ENCOUNTER — Other Ambulatory Visit: Payer: Self-pay | Admitting: Family Medicine

## 2022-04-21 DIAGNOSIS — F411 Generalized anxiety disorder: Secondary | ICD-10-CM

## 2022-04-23 ENCOUNTER — Other Ambulatory Visit: Payer: Self-pay | Admitting: Family Medicine

## 2022-04-23 DIAGNOSIS — F411 Generalized anxiety disorder: Secondary | ICD-10-CM

## 2022-04-25 MED ORDER — ALPRAZOLAM 0.5 MG PO TABS: 0.5000 mg | ORAL_TABLET | Freq: Every day | ORAL | 0 refills | Status: DC | PRN

## 2022-04-28 ENCOUNTER — Emergency Department
Admission: EM | Admit: 2022-04-28 | Discharge: 2022-04-28 | Discharge status: Against Medical Advice | Payer: BC Managed Care – PPO

## 2022-04-28 ENCOUNTER — Telehealth: Payer: Self-pay | Admitting: Nurse Practitioner

## 2022-04-28 ENCOUNTER — Other Ambulatory Visit
Admission: RE | Admit: 2022-04-28 | Discharge: 2022-04-28 | Disposition: A | Discharge status: Home / Self Care | Payer: BC Managed Care – PPO | Attending: Nurse Practitioner | Admitting: Nurse Practitioner

## 2022-04-28 ENCOUNTER — Encounter: Payer: Self-pay | Admitting: Nurse Practitioner

## 2022-04-28 ENCOUNTER — Ambulatory Visit (INDEPENDENT_AMBULATORY_CARE_PROVIDER_SITE_OTHER): Payer: BC Managed Care – PPO | Admitting: Nurse Practitioner

## 2022-04-28 VITALS — BP 140/76 | HR 95 | Temp 99.0°F | Resp 16 | Ht 67.0 in | Wt 195.9 lb

## 2022-04-28 DIAGNOSIS — R197 Diarrhea, unspecified: Secondary | ICD-10-CM | POA: Insufficient documentation

## 2022-04-28 DIAGNOSIS — R1012 Left upper quadrant pain: Secondary | ICD-10-CM

## 2022-04-28 DIAGNOSIS — R1031 Right lower quadrant pain: Secondary | ICD-10-CM | POA: Insufficient documentation

## 2022-04-28 LAB — POCT URINALYSIS DIPSTICK
Bilirubin, UA: NEGATIVE
Blood, UA: NEGATIVE
Glucose, UA: NEGATIVE
Ketones, UA: NEGATIVE
Leukocytes, UA: NEGATIVE
Nitrite, UA: NEGATIVE
Protein, UA: NEGATIVE
Spec Grav, UA: >=1.03 — AB (ref 1.010–1.025)
Urobilinogen, UA: 0.2 E.U./dL
pH, UA: 6 (ref 5.0–8.0)

## 2022-04-28 LAB — CBC WITH DIFFERENTIAL/PLATELET
Abs Immature Granulocytes: 0.03 10*3/uL (ref 0.00–0.07)
Basophils Absolute: 0.1 10*3/uL (ref 0.0–0.1)
Basophils Relative: 1 %
Eosinophils Absolute: 0.2 10*3/uL (ref 0.0–0.5)
Eosinophils Relative: 2 %
HCT: 44.1 % (ref 36.0–46.0)
Hemoglobin: 15.3 g/dL — ABNORMAL HIGH (ref 12.0–15.0)
Immature Granulocytes: 0 %
Lymphocytes Relative: 32 %
Lymphs Abs: 2.8 10*3/uL (ref 0.7–4.0)
MCH: 31.2 pg (ref 26.0–34.0)
MCHC: 34.7 g/dL (ref 30.0–36.0)
MCV: 90 fL (ref 80.0–100.0)
Monocytes Absolute: 0.8 10*3/uL (ref 0.1–1.0)
Monocytes Relative: 9 %
Neutro Abs: 4.9 10*3/uL (ref 1.7–7.7)
Neutrophils Relative %: 56 %
Platelets: 229 10*3/uL (ref 150–400)
RBC: 4.9 MIL/uL (ref 3.87–5.11)
RDW: 12.7 % (ref 11.5–15.5)
WBC: 8.9 10*3/uL (ref 4.0–10.5)
nRBC: 0.3 % — ABNORMAL HIGH (ref 0.0–0.2)

## 2022-04-28 LAB — COMPREHENSIVE METABOLIC PANEL
ALT: 20 U/L (ref 0–44)
AST: 23 U/L (ref 15–41)
Albumin: 4.3 g/dL (ref 3.5–5.0)
Alkaline Phosphatase: 63 U/L (ref 38–126)
Anion gap: 4 — ABNORMAL LOW (ref 5–15)
BUN: 7 mg/dL (ref 6–20)
CO2: 24 mmol/L (ref 22–32)
Calcium: 9.1 mg/dL (ref 8.9–10.3)
Chloride: 107 mmol/L (ref 98–111)
Creatinine, Ser: 0.74 mg/dL (ref 0.44–1.00)
GFR, Estimated: >60 mL/min (ref >60)
Glucose, Bld: 94 mg/dL (ref 70–99)
Potassium: 3.9 mmol/L (ref 3.5–5.1)
Sodium: 135 mmol/L (ref 135–145)
Total Bilirubin: 0.8 mg/dL (ref 0.3–1.2)
Total Protein: 7.4 g/dL (ref 6.5–8.1)

## 2022-04-28 LAB — AMYLASE: Amylase: 32 U/L (ref 28–100)

## 2022-04-28 LAB — POCT URINE PREGNANCY: Preg Test, Ur: NEGATIVE

## 2022-04-28 LAB — LIPASE, BLOOD: Lipase: 39 U/L (ref 11–51)

## 2022-04-28 MED ORDER — LOPERAMIDE HCL 2 MG PO CAPS: 2.0000 mg | ORAL_CAPSULE | Freq: Four times a day (QID) | ORAL | 0 refills | Status: DC | PRN

## 2022-04-28 MED ORDER — ONDANSETRON HCL 4 MG PO TABS: 4.0000 mg | ORAL_TABLET | Freq: Three times a day (TID) | ORAL | 0 refills | Status: DC | PRN

## 2022-04-28 NOTE — ED Notes (Signed)
No answer when called several times from lobby 

## 2022-04-28 NOTE — Progress Notes (Signed)
BP 140/76   Pulse 95   Temp 99 F (37.2 C) (Oral)   Resp 16   Ht 5\' 7"  (1.702 m)   Wt 195 lb 14.4 oz (88.9 kg)   SpO2 95%   BMI 30.68 kg/m    Subjective:    Patient ID: , female    DOB: [birth year], 46 y.o.   MRN: [id number]  HPI: Destiny Atkinson is a 46 y.o. female  Chief Complaint  Patient presents with   Abdominal Pain    Upper left quadrant radiates toward lower right quadrant. Started around Monday evening, pain is on/off even makes feel nauseous.   Abdominal pain: She went out to eat on Sunday, woke up Monday morning with abdominal pain, nausea and diarrhea. She says she then slept and would wake up with nausea.  She says her she is having sharp pain in her right lower quadrant and stabbing pain in her left upper quadrant.  She says that she is unable to eat.  She says she is feeling really tired. She says that she is still having diarrhea.  This her diarrhea is like water.  Patient denies any urinary complaints.  She says she has not had a documented fever but reports feeling feverish. Her temperature here was 99. She says that she last ate at 10:30 am.  Ordering stat labs, CT,  stool culture and urine.  Send in prescription for Zofran and Imodium.  Patient agreeable with current plan.  Relevant past medical, surgical, family and social history reviewed and updated as indicated. Interim medical history since our last visit reviewed. Allergies and medications reviewed and updated.  Review of Systems  Constitutional: Positive for feeling feverish and fatigue, negative weight change.  Respiratory: Negative for cough and shortness of breath.   Cardiovascular: Negative for chest pain or palpitations.  Gastrointestinal: positive for abdominal pain, diarrhea and nausea Musculoskeletal: Negative for gait problem or joint swelling.  Skin: Negative for rash.  Neurological: Negative for dizziness or headache.  No other specific complaints in a complete review of  systems (except as listed in HPI above).      Objective:    BP 140/76   Pulse 95   Temp 99 F (37.2 C) (Oral)   Resp 16   Ht 5\' 7"  (1.702 m)   Wt 195 lb 14.4 oz (88.9 kg)   SpO2 95%   BMI 30.68 kg/m   Wt Readings from Last 3 Encounters:  04/28/22 195 lb 14.4 oz (88.9 kg)  01/19/22 195 lb 11.2 oz (88.8 kg)  10/26/21 192 lb (87.1 kg)    Physical Exam  Constitutional: Patient appears well-developed and well-nourished. Obese  No distress.  HEENT: head atraumatic, normocephalic, pupils equal and reactive to light,  neck supple Cardiovascular: Normal rate, regular rhythm and normal heart sounds.  No murmur heard. No BLE edema. Pulmonary/Chest: Effort normal and breath sounds normal. No respiratory distress. Abdominal: Soft.  Redness noted in the right lower quadrant and left upper quadrant Psychiatric: Patient has a normal mood and affect. behavior is normal. Judgment and thought content normal.  Results for orders placed or performed in visit on 04/28/22  POCT urine pregnancy  Result Value Ref Range   Preg Test, Ur Negative Negative  POCT urinalysis dipstick  Result Value Ref Range   Color, UA Yellow    Clarity, UA Clear    Glucose, UA Negative Negative   Bilirubin, UA Negative    Ketones, UA Negative    Spec  Grav, UA >=1.030 (A) 1.010 - 1.025   Blood, UA Negative    pH, UA 6.0 5.0 - 8.0   Protein, UA Negative Negative   Urobilinogen, UA 0.2 0.2 or 1.0 E.U./dL   Nitrite, UA Negative    Leukocytes, UA Negative Negative   Appearance     Odor        Assessment & Plan:   Problem List Items Addressed This Visit   None Visit Diagnoses     Right lower quadrant abdominal pain    -  Primary   Getting stat CT, lab work, urine, stool culture.  Sent in prescription for Zofran and Imodium.   Relevant Medications   ondansetron (ZOFRAN) 4 MG tablet   loperamide (IMODIUM) 2 MG capsule   Other Relevant Orders   CBC with Differential/Platelet   POCT urine pregnancy (Completed)    POCT urinalysis dipstick (Completed)   Amylase   Ova and parasite examination   Gastrointestinal Pathogen Pnl RT, PCR   CT ABDOMEN PELVIS W WO CONTRAST   Comprehensive metabolic panel   Lipase, blood   Left upper quadrant abdominal pain       Getting stat CT, lab work, urine, stool culture.  Sent in prescription for Zofran and Imodium.   Relevant Medications   ondansetron (ZOFRAN) 4 MG tablet   loperamide (IMODIUM) 2 MG capsule   Other Relevant Orders   CBC with Differential/Platelet   POCT urine pregnancy (Completed)   POCT urinalysis dipstick (Completed)   Amylase   Ova and parasite examination   Gastrointestinal Pathogen Pnl RT, PCR   CT ABDOMEN PELVIS W WO CONTRAST   Comprehensive metabolic panel   Lipase, blood   Diarrhea, unspecified type       Getting stat CT, lab work, urine, stool culture.  Sent in prescription for Zofran and Imodium.   Relevant Medications   ondansetron (ZOFRAN) 4 MG tablet   loperamide (IMODIUM) 2 MG capsule   Other Relevant Orders   CBC with Differential/Platelet   POCT urine pregnancy (Completed)   POCT urinalysis dipstick (Completed)   Amylase   Ova and parasite examination   Gastrointestinal Pathogen Pnl RT, PCR   CT ABDOMEN PELVIS W WO CONTRAST   Comprehensive metabolic panel   Lipase, blood        Follow up plan: Return if symptoms worsen or fail to improve.

## 2022-04-28 NOTE — Telephone Encounter (Signed)
LVM informing patient that her insurance just appproved her CT scan, but now the outpatient CT department is closed for the day and patient now needs to go through the ED to have CT done.

## 2022-04-29 ENCOUNTER — Other Ambulatory Visit: Payer: Self-pay | Admitting: Nurse Practitioner

## 2022-04-29 ENCOUNTER — Ambulatory Visit: Payer: BC Managed Care – PPO

## 2022-04-29 ENCOUNTER — Telehealth: Payer: Self-pay | Admitting: Family Medicine

## 2022-04-29 DIAGNOSIS — R197 Diarrhea, unspecified: Secondary | ICD-10-CM | POA: Diagnosis not present

## 2022-04-29 DIAGNOSIS — R1031 Right lower quadrant pain: Secondary | ICD-10-CM

## 2022-04-29 DIAGNOSIS — R1012 Left upper quadrant pain: Secondary | ICD-10-CM | POA: Diagnosis not present

## 2022-04-29 NOTE — Telephone Encounter (Signed)
Order sent to [facility] to set up appointment today. Patient notified

## 2022-04-29 NOTE — Telephone Encounter (Signed)
Dani from the preservice center called requesting an urgent call back from the clinic, says there is a problem with the patient's CT order.  Best contact: [phone] ext. [phone]

## 2022-04-29 NOTE — Telephone Encounter (Signed)
Pt couldn't get CT scan Yesterday due to insurance approving it late and location was already closed / she was advised to get a new order for today through outpatient location/ please advise so pt can get CT today / please advise when scheduled or placed

## 2022-05-03 ENCOUNTER — Encounter: Payer: Self-pay | Admitting: Nurse Practitioner

## 2022-05-04 ENCOUNTER — Ambulatory Visit: Payer: BC Managed Care – PPO | Admitting: Family Medicine

## 2022-05-06 LAB — GASTROINTESTINAL PATHOGEN PNL
CampyloBacter Group: NOT DETECTED
Norovirus GI/GII: NOT DETECTED
Rotavirus A: NOT DETECTED
Salmonella species: NOT DETECTED
Shiga Toxin 1: NOT DETECTED
Shiga Toxin 2: NOT DETECTED
Shigella Species: NOT DETECTED
Vibrio Group: NOT DETECTED
Yersinia enterocolitica: NOT DETECTED

## 2022-05-06 LAB — OVA AND PARASITE EXAMINATION
CONCENTRATE RESULT:: NONE SEEN
MICRO NUMBER:: 13507408
SPECIMEN QUALITY:: ADEQUATE
TRICHROME RESULT:: NONE SEEN

## 2022-05-09 ENCOUNTER — Ambulatory Visit
Admission: RE | Admit: 2022-05-09 | Discharge: 2022-05-09 | Disposition: A | Discharge status: Home / Self Care | Payer: BC Managed Care – PPO | Source: Ambulatory Visit | Attending: Nurse Practitioner | Admitting: Nurse Practitioner

## 2022-05-09 DIAGNOSIS — R1031 Right lower quadrant pain: Secondary | ICD-10-CM | POA: Diagnosis not present

## 2022-05-09 DIAGNOSIS — R1110 Vomiting, unspecified: Secondary | ICD-10-CM | POA: Diagnosis not present

## 2022-05-09 MED ORDER — IOHEXOL 300 MG/ML  SOLN
100.0000 mL | Freq: Once | INTRAMUSCULAR | Status: AC | PRN
Start: 2022-05-09 — End: 2022-05-09
  Administered 2022-05-09: 100 mL via INTRAVENOUS

## 2022-05-10 ENCOUNTER — Other Ambulatory Visit: Payer: Self-pay | Admitting: Nurse Practitioner

## 2022-05-10 DIAGNOSIS — R1012 Left upper quadrant pain: Secondary | ICD-10-CM

## 2022-05-15 ENCOUNTER — Other Ambulatory Visit: Payer: Self-pay | Admitting: Family Medicine

## 2022-05-15 DIAGNOSIS — F321 Major depressive disorder, single episode, moderate: Secondary | ICD-10-CM

## 2022-05-15 DIAGNOSIS — F411 Generalized anxiety disorder: Secondary | ICD-10-CM

## 2022-05-15 DIAGNOSIS — E1159 Type 2 diabetes mellitus with other circulatory complications: Secondary | ICD-10-CM

## 2022-05-15 DIAGNOSIS — I10 Essential (primary) hypertension: Secondary | ICD-10-CM

## 2022-05-30 ENCOUNTER — Other Ambulatory Visit: Payer: Self-pay | Admitting: Family Medicine

## 2022-06-03 ENCOUNTER — Encounter: Payer: Self-pay | Admitting: Family Medicine

## 2022-06-13 ENCOUNTER — Other Ambulatory Visit: Payer: Self-pay | Admitting: Family Medicine

## 2022-06-13 DIAGNOSIS — E559 Vitamin D deficiency, unspecified: Secondary | ICD-10-CM

## 2022-06-15 ENCOUNTER — Encounter: Payer: Self-pay | Admitting: Family Medicine

## 2022-06-15 DIAGNOSIS — N926 Irregular menstruation, unspecified: Secondary | ICD-10-CM

## 2022-06-17 ENCOUNTER — Other Ambulatory Visit: Payer: Self-pay | Admitting: Family Medicine

## 2022-06-17 DIAGNOSIS — N920 Excessive and frequent menstruation with regular cycle: Secondary | ICD-10-CM

## 2022-06-26 ENCOUNTER — Other Ambulatory Visit: Payer: Self-pay | Admitting: Family Medicine

## 2022-06-30 ENCOUNTER — Other Ambulatory Visit: Payer: Self-pay | Admitting: Family Medicine

## 2022-06-30 DIAGNOSIS — F411 Generalized anxiety disorder: Secondary | ICD-10-CM

## 2022-07-01 NOTE — Progress Notes (Unsigned)
Name: Destiny Atkinson   MRN: [id number]    DOB: 12/16/1975   Date:07/04/2022       Progress Note  Subjective  Chief Complaint  Annual Exam  HPI  Patient presents for annual CPE.  Diet: eating more salads and smaller portions during the Summer  Exercise: discussed 150 minutes per week.   Last Eye Exam: 18 months ago  Last Dental Exam: every 6 months   [place] Office Visit from 10/29/2020 in [facility]  AUDIT-C Score 0      Depression: Phq 9 is  positive    07/04/2022    2:31 PM 04/28/2022    2:43 PM 01/19/2022    7:53 AM 10/26/2021    9:22 AM 09/06/2021    9:00 AM  Depression screen PHQ 2/9  Decreased Interest 2 0 $R'1 2 1  'Pc$ Down, Depressed, Hopeless 2 0 $R'1 2 1  'GM$ PHQ - 2 Score 4 0 $R'2 4 2  'zV$ Altered sleeping $RemoveBeforeDE'2  1 2 'QtTRCubGsORZhJG$ 0  Tired, decreased energy $RemoveBeforeDE'3  2 2 3  'BOwmufOqdWhgqWQ$ Change in appetite $RemoveBef'2  2 2 3  'nTUewUoMtQ$ Feeling bad or failure about yourself  0  2 1 0  Trouble concentrating 0  0 1 0  Moving slowly or fidgety/restless 0  0 0 1  Suicidal thoughts 0  0 0 0  PHQ-9 Score $RemoveBef'11  9 12 9  'iWuQWnvpbv$ Difficult doing work/chores   Somewhat difficult     Hypertension: BP Readings from Last 3 Encounters:  07/04/22 124/76  04/28/22 140/76  01/19/22 138/74   Obesity: Wt Readings from Last 3 Encounters:  07/04/22 195 lb (88.5 kg)  04/28/22 195 lb 14.4 oz (88.9 kg)  01/19/22 195 lb 11.2 oz (88.8 kg)   BMI Readings from Last 3 Encounters:  07/04/22 30.54 kg/m  04/28/22 30.68 kg/m  01/19/22 30.65 kg/m     Vaccines:   Tdap: up to date Shingrix: N/A Pneumonia: up to date  Flu: refused  COVID-19: refused    Hep C Screening: 10/29/20 STD testing and prevention (HIV/chl/gon/syphilis): 06/28/96 Intimate partner violence: negative screen  Sexual History : one partner, no pain during sex  Menstrual History/LMP/Abnormal Bleeding: she has hormone IUD, was not having cycles but had a heavy period recently  Discussed importance of follow up if any post-menopausal bleeding: not applicable   Incontinence Symptoms:mild stress incontinence   Breast cancer:  - Last Mammogram: 02/17/21, ordering today  - BRCA gene screening: N/A   Osteoporosis Prevention : Discussed high calcium and vitamin D supplementation, weight bearing exercises Bone density: not applicable   Cervical cancer screening: GYN appt tomorrow   Skin cancer: Discussed monitoring for atypical lesions  Colorectal cancer: N/A   Lung cancer:  Low Dose CT Chest recommended if Age 15-80 years, 20 pack-year currently smoking OR have quit w/in 15years. Patient does not qualify for screen   ECG: today   Advanced Care Planning: A voluntary discussion about advance care planning including the explanation and discussion of advance directives.  Discussed health care proxy and Living will, and the patient was able to identify a health care proxy as husband .  Patient does not have a living will and power of attorney of health care   Lipids: Lab Results  Component Value Date   CHOL 196 07/22/2021   CHOL 202 (H) 10/29/2020   CHOL 203 (H) 12/20/2019   Lab Results  Component Value Date   HDL 44 (L) 07/22/2021   HDL 47 10/29/2020   HDL  42 (L) 12/20/2019   Lab Results  Component Value Date   LDLCALC 114 (H) 07/22/2021   LDLCALC 113 (H) 10/29/2020   LDLCALC 105 (H) 12/20/2019   Lab Results  Component Value Date   TRIG 263 (H) 07/22/2021   TRIG 243 (H) 10/29/2020   TRIG 389 (H) 12/20/2019   Lab Results  Component Value Date   CHOLHDL 4.5 07/22/2021   CHOLHDL 4.3 10/29/2020   CHOLHDL 4.8 12/20/2019   No results found for: "LDLDIRECT"  Glucose: Glucose  Date Value Ref Range Status  10/29/2020 84 65 - 99 mg/dL Final   Glucose, Bld  Date Value Ref Range Status  04/28/2022 94 70 - 99 mg/dL Final    Comment:    Glucose reference range applies only to samples taken after fasting for at least 8 hours.  07/22/2021 146 (H) 65 - 139 mg/dL Final    Comment:    .        Non-fasting reference interval .    12/20/2019 133 (H) 65 - 99 mg/dL Final    Comment:    .            Fasting reference interval . For someone without known diabetes, a glucose value >125 mg/dL indicates that they may have diabetes and this should be confirmed with a follow-up test. .     Patient Active Problem List   Diagnosis Date Noted   COPD with asthma (HCC) 01/19/2022   Hypertension associated with type 2 diabetes mellitus (HCC) 10/26/2021   Moderate major depression (HCC) 10/26/2021   Vitamin D deficiency 12/28/2016   Mild major depression (HCC) 11/17/2015   GAD (generalized anxiety disorder) 05/06/2015   Asthma, moderate persistent 05/06/2015   Mobitz type I incomplete atrioventricular block 05/06/2015   B12 deficiency 05/06/2015   Essential (primary) hypertension 05/06/2015   Irregular bleeding 05/06/2015   Migraine without aura and responsive to treatment 05/06/2015   BMI 31.0-31.9,adult 05/06/2015   Peripheral neuropathy 05/06/2015   Perennial allergic rhinitis 05/06/2015   Restless leg 05/06/2015   Tobacco abuse 05/06/2015    Past Surgical History:  Procedure Laterality Date   COLONOSCOPY     in 8th grade, not as adult   ESOPHAGOGASTRODUODENOSCOPY     age 42   NASAL SINUS SURGERY  01/21/2010   septoplasty/ sinus surgery   WISDOM TOOTH EXTRACTION  age 8    Family History  Problem Relation Age of Onset   COPD Mother    Fibromyalgia Mother    Hypertension Mother    Stroke Mother    Asthma Mother    Arthritis Mother    Depression Mother    Heart disease Mother    Crohn's disease Mother    Diabetes Father    Epilepsy Brother    Diabetes type II Daughter    Stomach cancer Paternal Grandmother 83   Ovarian cancer Other 45   Ovarian cancer Other 50   Diabetes type II Daughter    Kidney Stones Daughter    Bipolar disorder Maternal Grandmother     Social History   Socioeconomic History   Marital status: Married    Spouse name: Reuel Boom   Number of children: 2   Years of  education: 14   Highest education level: Some college, no degree  Occupational History   Occupation: Associate Professor: [employer]  Tobacco Use   Smoking status: Every Day    Packs/day: 0.25    Years: 24.00  Total pack years: 6.00    Types: Cigarettes, E-cigarettes    Start date: 06/29/1995   Smokeless tobacco: Never  Vaping Use   Vaping Use: Former   Start date: 02/14/2015   Quit date: 11/15/2018  Substance and Sexual Activity   Alcohol use: Yes    Alcohol/week: 0.0 standard drinks of alcohol    Comment: rarely   Drug use: No   Sexual activity: Yes    Partners: Male    Birth control/protection: I.U.D.  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, community   Social Determinants of Health   Financial Resource Strain: Low Risk  (07/04/2022)   Overall Financial Resource Strain (CARDIA)    Difficulty of Paying Living Expenses: Not very hard  Food Insecurity: Food Insecurity Present (07/04/2022)   Hunger Vital Sign    Worried About Running Out of Food in the Last Year: Never true    Ran Out of Food in the Last Year: Sometimes true  Transportation Needs: No Transportation Needs (07/04/2022)   PRAPARE - Administrator, Civil Service (Medical): No    Lack of Transportation (Non-Medical): No  Physical Activity: Insufficiently Active (07/04/2022)   Exercise Vital Sign    Days of Exercise per Week: 2 days    Minutes of Exercise per Session: 30 min  Stress: Stress Concern Present (07/04/2022)   [employer] of Occupational Health - Occupational Stress Questionnaire    Feeling of Stress : Rather much  Social Connections: Socially Isolated (07/04/2022)   Social Connection and Isolation Panel [NHANES]    Frequency of Communication with Friends and Family: Twice a week    Frequency of Social Gatherings with Friends and Family:  Never    Attends Religious Services: Never    Database administrator or Organizations: No    Attends Banker Meetings: Never    Marital Status: Married  Catering manager Violence: Not At Risk (07/04/2022)   Humiliation, Afraid, Rape, and Kick questionnaire    Fear of Current or Ex-Partner: No    Emotionally Abused: No    Physically Abused: No    Sexually Abused: No     Current Outpatient Medications:    albuterol (VENTOLIN HFA) 108 (90 Base) MCG/ACT inhaler, USE 2 INHALATIONS EVERY 6 HOURS AS NEEDED FOR WHEEZING OR SHORTNESS OF BREATH, Disp: 17 g, Rfl: 0   ALPRAZolam (XANAX) 0.5 MG tablet, Take 1 tablet (0.5 mg total) by mouth daily as needed for anxiety., Disp: 18 tablet, Rfl: 0   escitalopram (LEXAPRO) 20 MG tablet, TAKE 1 TABLET DAILY, Disp: 90 tablet, Rfl: 0   hydrochlorothiazide (HYDRODIURIL) 12.5 MG tablet, Take 12.5 mg by mouth daily., Disp: , Rfl:    hydrOXYzine (ATARAX) 25 MG tablet, TAKE 1 TABLET AT BEDTIME AS NEEDED, Disp: 90 tablet, Rfl: 0   ipratropium-albuterol (DUONEB) 0.5-2.5 (3) MG/3ML SOLN, Take 3 mLs by nebulization every 6 (six) hours as needed., Disp: 360 mL, Rfl: 1   levocetirizine (XYZAL) 5 MG tablet, Take 1 tablet (5 mg total) by mouth daily., Disp: 90 tablet, Rfl: 1   loperamide (IMODIUM) 2 MG capsule, Take 1 capsule (2 mg total) by mouth 4 (four) times daily as needed for diarrhea or loose stools., Disp: 12 capsule, Rfl: 0   loratadine (CLARITIN) 10 MG tablet, Take 10 mg by mouth daily., Disp: , Rfl:  losartan (COZAAR) 25 MG tablet, TAKE 1 TABLET DAILY, Disp: 90 tablet, Rfl: 0   nystatin (MYCOSTATIN) 100000 UNIT/ML suspension, Take 5 mLs (500,000 Units total) by mouth 4 (four) times daily., Disp: 60 mL, Rfl: 0   ondansetron (ZOFRAN) 4 MG tablet, Take 1 tablet (4 mg total) by mouth every 8 (eight) hours as needed for nausea or vomiting., Disp: 20 tablet, Rfl: 0   rizatriptan (MAXALT-MLT) 10 MG disintegrating tablet, Take 1 tablet (10 mg total) by  mouth as needed for migraine. May repeat in 2 hours if needed, Disp: 27 tablet, Rfl: 1   rOPINIRole (REQUIP) 1 MG tablet, TAKE 1 TABLET AT BEDTIME, Disp: 90 tablet, Rfl: 0   Ubrogepant (UBRELVY) 100 MG TABS, Take 1 tablet by mouth daily as needed., Disp: 10 tablet, Rfl: 2   Vitamin D, Ergocalciferol, (DRISDOL) 1.25 MG (50000 UNIT) CAPS capsule, TAKE 1 CAPSULE ONCE WEEKLY, Disp: 12 capsule, Rfl: 0   Levonorgestrel (LILETTA, 52 MG,) 19.5 MCG/DAY IUD IUD, 1 Intra Uterine Device (1 each total) by Intrauterine route once for 1 dose., Disp: 1 each, Rfl: 0  Allergies  Allergen Reactions   Dog Epithelium     Anaphylaxis with Rabbits.  Dogs, cats, birds cause swelling, extreme itching.   Dust Mite Extract    Pollen Extract    Soap    Tape    Tree Extract      ROS  Constitutional: Negative for fever or weight change.  Respiratory: Negative for cough and shortness of breath.   Cardiovascular: intermittent left side  chest pain that lasts about 15 seconds  positive for intermittent palpitations.  Gastrointestinal: Negative for abdominal pain, no bowel changes.  Musculoskeletal: Negative for gait problem or joint swelling.  Skin: Negative for rash.  Neurological: Negative for dizziness or headache.  No other specific complaints in a complete review of systems (except as listed in HPI above).   Objective  Vitals:   07/04/22 1431  BP: 124/76  Pulse: 91  Resp: 16  SpO2: 95%  Weight: 195 lb (88.5 kg)  Height: $Remove'5\' 7"'sQOXNXf$  (1.702 m)    Body mass index is 30.54 kg/m.  Physical Exam  Constitutional: Patient appears well-developed and well-nourished. No distress.  HENT: Head: Normocephalic and atraumatic. Ears: B TMs ok, no erythema or effusion; Nose: Nose normal. Mouth/Throat: Oropharynx is clear and moist. No oropharyngeal exudate.  Eyes: Conjunctivae and EOM are normal. Pupils are equal, round, and reactive to light. No scleral icterus.  Neck: Normal range of motion. Neck supple. No JVD  present. No thyromegaly present.  Cardiovascular: Normal rate, regular rhythm and normal heart sounds.  No murmur heard. No BLE edema. Pulmonary/Chest: Effort normal and breath sounds normal. No respiratory distress. Abdominal: Soft. Bowel sounds are normal, no distension. There is no tenderness. no masses Breast: not done  FEMALE GENITALIA:  Not done  RECTAL: not done  Musculoskeletal: Normal range of motion, no joint effusions. No gross deformities Neurological: he is alert and oriented to person, place, and time. No cranial nerve deficit. Coordination, balance, strength, speech and gait are normal.  Skin: Skin is warm and dry. No rash noted. No erythema.  Psychiatric: Patient has a normal mood and affect. behavior is normal. Judgment and thought content normal.   Recent Results (from the past 2160 hour(s))  POCT urine pregnancy     Status: None   Collection Time: 04/28/22  3:29 PM  Result Value Ref Range   Preg Test, Ur Negative Negative  POCT urinalysis dipstick  Status: Abnormal   Collection Time: 04/28/22  3:30 PM  Result Value Ref Range   Color, UA Yellow    Clarity, UA Clear    Glucose, UA Negative Negative   Bilirubin, UA Negative    Ketones, UA Negative    Spec Grav, UA >=1.030 (A) 1.010 - 1.025   Blood, UA Negative    pH, UA 6.0 5.0 - 8.0   Protein, UA Negative Negative   Urobilinogen, UA 0.2 0.2 or 1.0 E.U./dL   Nitrite, UA Negative    Leukocytes, UA Negative Negative   Appearance     Odor    Lipase, blood     Status: None   Collection Time: 04/28/22  4:34 PM  Result Value Ref Range   Lipase 39 11 - 51 U/L    Comment: Performed at [facility], [address] [place], [place] [address]  Comprehensive metabolic panel     Status: Abnormal   Collection Time: 04/28/22  4:34 PM  Result Value Ref Range   Sodium 135 135 - 145 mmol/L   Potassium 3.9 3.5 - 5.1 mmol/L   Chloride 107 98 - 111 mmol/L   CO2 24 22 - 32 mmol/L   Glucose, Bld 94 70 - 99 mg/dL     Comment: Glucose reference range applies only to samples taken after fasting for at least 8 hours.   BUN 7 6 - 20 mg/dL   Creatinine, Ser 0.74 0.44 - 1.00 mg/dL   Calcium 9.1 8.9 - 10.3 mg/dL   Total Protein 7.4 6.5 - 8.1 g/dL   Albumin 4.3 3.5 - 5.0 g/dL   AST 23 15 - 41 U/L   ALT 20 0 - 44 U/L   Alkaline Phosphatase 63 38 - 126 U/L   Total Bilirubin 0.8 0.3 - 1.2 mg/dL   GFR, Estimated >60 >60 mL/min    Comment: (NOTE) Calculated using the CKD-EPI Creatinine Equation (2021)    Anion gap 4 (L) 5 - 15    Comment: Performed at [facility], [address] [place], [place] [address]  Amylase     Status: None   Collection Time: 04/28/22  4:34 PM  Result Value Ref Range   Amylase 32 28 - 100 U/L    Comment: Performed at [facility], [address] [place], [place] [address]  CBC with Differential/Platelet     Status: Abnormal   Collection Time: 04/28/22  4:34 PM  Result Value Ref Range   WBC 8.9 4.0 - 10.5 K/uL    Comment: WHITE COUNT CONFIRMED ON SMEAR   RBC 4.90 3.87 - 5.11 MIL/uL   Hemoglobin 15.3 (H) 12.0 - 15.0 g/dL   HCT 44.1 36.0 - 46.0 %   MCV 90.0 80.0 - 100.0 fL   MCH 31.2 26.0 - 34.0 pg   MCHC 34.7 30.0 - 36.0 g/dL   RDW 12.7 11.5 - 15.5 %   Platelets 229 150 - 400 K/uL    Comment: REPEATED TO VERIFY PLATELET COUNT CONFIRMED BY SMEAR    nRBC 0.3 (H) 0.0 - 0.2 %   Neutrophils Relative % 56 %   Neutro Abs 4.9 1.7 - 7.7 K/uL   Lymphocytes Relative 32 %   Lymphs Abs 2.8 0.7 - 4.0 K/uL   Monocytes Relative 9 %   Monocytes Absolute 0.8 0.1 - 1.0 K/uL   Eosinophils Relative 2 %   Eosinophils Absolute 0.2 0.0 - 0.5 K/uL   Basophils Relative 1 %   Basophils Absolute  0.1 0.0 - 0.1 K/uL   Immature Granulocytes 0 %   Abs Immature Granulocytes 0.03 0.00 - 0.07 K/uL    Comment: Performed at [facility], [address] [place], [place] [address]  Ova and parasite examination     Status: None   Collection Time: 04/29/22  3:55 PM    Specimen: Stool  Result Value Ref Range   MICRO NUMBER: [id number]    SPECIMEN QUALITY: Adequate    Source STOOL    STATUS: FINAL    CONCENTRATE RESULT: No ova or parasites seen    TRICHROME RESULT: No ova or parasites seen    COMMENT:      Routine Ova and Parasite exam may not detect some parasites that occasionally cause diarrheal illness. Cryptosporidium Antigen and/or Cyclospora and Isospora Exam may be ordered to detect these parasites. One negative sample does not necessarily rule out  the presence of a parasitic infection.  For additional information, please refer to https://education.[facility].com/faq/FAQ203 (This link is being provided for informational/ educational purposes only.)   Gastrointestinal Pathogen Pnl RT, PCR     Status: None   Collection Time: 04/29/22  3:55 PM  Result Value Ref Range   CampyloBacter Group NOT DETECTED NOT DETECTED   Salmonella species NOT DETECTED NOT DETECTED   Shigella Species NOT DETECTED NOT DETECTED   Vibrio Group NOT DETECTED NOT DETECTED   Yersinia enterocolitica NOT DETECTED NOT DETECTED   Shiga Toxin 1 NOT DETECTED NOT DETECTED   Shiga Toxin 2 NOT DETECTED NOT DETECTED   Norovirus GI/GII NOT DETECTED NOT DETECTED   Rotavirus A NOT DETECTED NOT DETECTED    Comment: Organisms included in the Campylobacter group include C. coli, C. jejuni, and C. lari. Organisms included in the Shigella species include S. dysenteriae, S. boydii, S. sonnei, and S. flexneri. Organisms included in the Vibrio group include V. cholerae and V. parahaemolyticus. . The analytical performance characteristics of this assay have been determined by [facility]. The modifications have not been cleared or approved by the FDA. This assay has been validated pursuant to the CLIA  regulations and is used for clinical purposes.      Fall Risk:    07/04/2022    2:31 PM 04/28/2022    2:43 PM 01/19/2022    7:53 AM 10/26/2021    9:18 AM 09/06/2021    9:00 AM   Fall Risk   Falls in the past year? 1 0 $R'1 1 1  'Gv$ Number falls in past yr: 0 0 0 0 0  Injury with Fall? 0 0 0 0 0  Risk for fall due to : No Fall Risks No Fall Risks History of fall(s) No Fall Risks No Fall Risks  Follow up Falls prevention discussed Falls prevention discussed Falls evaluation completed;Education provided Falls prevention discussed Falls prevention discussed     Functional Status Survey: Is the patient deaf or have difficulty hearing?: No Does the patient have difficulty seeing, even when wearing glasses/contacts?: Yes Does the patient have difficulty concentrating, remembering, or making decisions?: No Does the patient have difficulty walking or climbing stairs?: No Does the patient have difficulty dressing or bathing?: No Does the patient have difficulty doing errands alone such as visiting a doctor's office or shopping?: No   Assessment & Plan  1. Well adult exam  - EKG 12-Lead  2. Breast cancer screening by mammogram  Mammogram   3. Hypertension associated with type 2 diabetes mellitus (HCC)  - EKG 12-Lead - COMPLETE METABOLIC PANEL WITH  GFR - CBC with Differential/Platelet  4. B12 deficiency  - Vitamin B12  5. Vitamin D deficiency  - VITAMIN D 25 Hydroxy (Vit-D Deficiency, Fractures)  6. Dyslipidemia associated with type 2 diabetes mellitus (HCC)  - Lipid panel - Microalbumin / creatinine urine ratio - Hemoglobin A1c   -USPSTF grade A and B recommendations reviewed with patient; age-appropriate recommendations, preventive care, screening tests, etc discussed and encouraged; healthy living encouraged; see AVS for patient education given to patient -Discussed importance of 150 minutes of physical activity weekly, eat two servings of fish weekly, eat one serving of tree nuts ( cashews, pistachios, pecans, almonds.Marland Kitchen) every other day, eat 6 servings of fruit/vegetables daily and drink plenty of water and avoid sweet beverages.   -Reviewed Health  Maintenance: Yes.

## 2022-07-01 NOTE — Patient Instructions (Signed)
Preventive Care 40-45 Years Old, Female Preventive care refers to lifestyle choices and visits with your health care provider that can promote health and wellness. Preventive care visits are also called wellness exams. What can I expect for my preventive care visit? Counseling Your health care provider may ask you questions about your: Medical history, including: Past medical problems. Family medical history. Pregnancy history. Current health, including: Menstrual cycle. Method of birth control. Emotional well-being. Home life and relationship well-being. Sexual activity and sexual health. Lifestyle, including: Alcohol, nicotine or tobacco, and drug use. Access to firearms. Diet, exercise, and sleep habits. Work and work environment. Sunscreen use. Safety issues such as seatbelt and bike helmet use. Physical exam Your health care provider will check your: Height and weight. These may be used to calculate your BMI (body mass index). BMI is a measurement that tells if you are at a healthy weight. Waist circumference. This measures the distance around your waistline. This measurement also tells if you are at a healthy weight and may help predict your risk of certain diseases, such as type 2 diabetes and high blood pressure. Heart rate and blood pressure. Body temperature. Skin for abnormal spots. What immunizations do I need?  Vaccines are usually given at various ages, according to a schedule. Your health care provider will recommend vaccines for you based on your age, medical history, and lifestyle or other factors, such as travel or where you work. What tests do I need? Screening Your health care provider may recommend screening tests for certain conditions. This may include: Lipid and cholesterol levels. Diabetes screening. This is done by checking your blood sugar (glucose) after you have not eaten for a while (fasting). Pelvic exam and Pap test. Hepatitis B test. Hepatitis C  test. HIV (human immunodeficiency virus) test. STI (sexually transmitted infection) testing, if you are at risk. Lung cancer screening. Colorectal cancer screening. Mammogram. Talk with your health care provider about when you should start having regular mammograms. This may depend on whether you have a family history of breast cancer. BRCA-related cancer screening. This may be done if you have a family history of breast, ovarian, tubal, or peritoneal cancers. Bone density scan. This is done to screen for osteoporosis. Talk with your health care provider about your test results, treatment options, and if necessary, the need for more tests. Follow these instructions at home: Eating and drinking  Eat a diet that includes fresh fruits and vegetables, whole grains, lean protein, and low-fat dairy products. Take vitamin and mineral supplements as recommended by your health care provider. Do not drink alcohol if: Your health care provider tells you not to drink. You are pregnant, may be pregnant, or are planning to become pregnant. If you drink alcohol: Limit how much you have to 0-1 drink a day. Know how much alcohol is in your drink. In the [place] one drink equals one 12 oz bottle of beer (355 mL), one 5 oz glass of wine (148 mL), or one 1 oz glass of hard liquor (44 mL). Lifestyle Brush your teeth every morning and night with fluoride toothpaste. Floss one time each day. Exercise for at least 30 minutes 5 or more days each week. Do not use any products that contain nicotine or tobacco. These products include cigarettes, chewing tobacco, and vaping devices, such as e-cigarettes. If you need help quitting, ask your health care provider. Do not use drugs. If you are sexually active, practice safe sex. Use a condom or other form of protection to   prevent STIs. If you do not wish to become pregnant, use a form of birth control. If you plan to become pregnant, see your health care provider for a  prepregnancy visit. Take aspirin only as told by your health care provider. Make sure that you understand how much to take and what form to take. Work with your health care provider to find out whether it is safe and beneficial for you to take aspirin daily. Find healthy ways to manage stress, such as: Meditation, yoga, or listening to music. Journaling. Talking to a trusted person. Spending time with friends and family. Minimize exposure to UV radiation to reduce your risk of skin cancer. Safety Always wear your seat belt while driving or riding in a vehicle. Do not drive: If you have been drinking alcohol. Do not ride with someone who has been drinking. When you are tired or distracted. While texting. If you have been using any mind-altering substances or drugs. Wear a helmet and other protective equipment during sports activities. If you have firearms in your house, make sure you follow all gun safety procedures. Seek help if you have been physically or sexually abused. What's next? Visit your health care provider once a year for an annual wellness visit. Ask your health care provider how often you should have your eyes and teeth checked. Stay up to date on all vaccines. This information is not intended to replace advice given to you by your health care provider. Make sure you discuss any questions you have with your health care provider. Document Revised: 05/05/2021 Document Reviewed: 05/05/2021 Elsevier Patient Education  [place].

## 2022-07-04 ENCOUNTER — Ambulatory Visit (INDEPENDENT_AMBULATORY_CARE_PROVIDER_SITE_OTHER): Payer: BC Managed Care – PPO | Admitting: Family Medicine

## 2022-07-04 ENCOUNTER — Encounter: Payer: Self-pay | Admitting: Family Medicine

## 2022-07-04 VITALS — BP 124/76 | HR 91 | Resp 16 | Ht 67.0 in | Wt 195.0 lb

## 2022-07-04 DIAGNOSIS — E559 Vitamin D deficiency, unspecified: Secondary | ICD-10-CM

## 2022-07-04 DIAGNOSIS — Z1231 Encounter for screening mammogram for malignant neoplasm of breast: Secondary | ICD-10-CM

## 2022-07-04 DIAGNOSIS — E1159 Type 2 diabetes mellitus with other circulatory complications: Secondary | ICD-10-CM | POA: Diagnosis not present

## 2022-07-04 DIAGNOSIS — E538 Deficiency of other specified B group vitamins: Secondary | ICD-10-CM | POA: Diagnosis not present

## 2022-07-04 DIAGNOSIS — Z124 Encounter for screening for malignant neoplasm of cervix: Secondary | ICD-10-CM

## 2022-07-04 DIAGNOSIS — E1169 Type 2 diabetes mellitus with other specified complication: Secondary | ICD-10-CM

## 2022-07-04 DIAGNOSIS — E785 Hyperlipidemia, unspecified: Secondary | ICD-10-CM

## 2022-07-04 DIAGNOSIS — I152 Hypertension secondary to endocrine disorders: Secondary | ICD-10-CM | POA: Diagnosis not present

## 2022-07-04 DIAGNOSIS — Z0000 Encounter for general adult medical examination without abnormal findings: Secondary | ICD-10-CM | POA: Diagnosis not present

## 2022-07-05 ENCOUNTER — Ambulatory Visit (INDEPENDENT_AMBULATORY_CARE_PROVIDER_SITE_OTHER): Payer: BC Managed Care – PPO | Admitting: Obstetrics & Gynecology

## 2022-07-05 ENCOUNTER — Encounter: Payer: Self-pay | Admitting: Obstetrics & Gynecology

## 2022-07-05 ENCOUNTER — Other Ambulatory Visit (HOSPITAL_COMMUNITY)
Admission: RE | Admit: 2022-07-05 | Discharge: 2022-07-05 | Disposition: A | Discharge status: Home / Self Care | Payer: BC Managed Care – PPO | Source: Ambulatory Visit | Attending: Anesthesiology | Admitting: Anesthesiology

## 2022-07-05 VITALS — BP 100/60 | Ht 67.0 in | Wt 196.8 lb

## 2022-07-05 DIAGNOSIS — Z124 Encounter for screening for malignant neoplasm of cervix: Secondary | ICD-10-CM

## 2022-07-05 LAB — LIPID PANEL
Cholesterol: 231 mg/dL — ABNORMAL HIGH (ref <200)
HDL: 49 mg/dL — ABNORMAL LOW (ref >=50)
LDL Cholesterol (Calc): 139 mg/dL (calc) — ABNORMAL HIGH
Non-HDL Cholesterol (Calc): 182 mg/dL (calc) — ABNORMAL HIGH (ref <130)
Total CHOL/HDL Ratio: 4.7 (calc) (ref <5.0)
Triglycerides: 298 mg/dL — ABNORMAL HIGH (ref <150)

## 2022-07-05 LAB — MICROALBUMIN / CREATININE URINE RATIO
Creatinine, Urine: 118 mg/dL (ref 20–275)
Microalb Creat Ratio: 3 mcg/mg creat (ref <30)
Microalb, Ur: 0.3 mg/dL

## 2022-07-05 LAB — COMPLETE METABOLIC PANEL WITH GFR
AG Ratio: 2 (calc) (ref 1.0–2.5)
ALT: 16 U/L (ref 6–29)
AST: 18 U/L (ref 10–30)
Albumin: 4.7 g/dL (ref 3.6–5.1)
Alkaline phosphatase (APISO): 68 U/L (ref 31–125)
BUN: 11 mg/dL (ref 7–25)
CO2: 25 mmol/L (ref 20–32)
Calcium: 9.6 mg/dL (ref 8.6–10.2)
Chloride: 104 mmol/L (ref 98–110)
Creat: 0.93 mg/dL (ref 0.50–0.99)
Globulin: 2.4 g/dL (calc) (ref 1.9–3.7)
Glucose, Bld: 128 mg/dL — ABNORMAL HIGH (ref 65–99)
Potassium: 4.4 mmol/L (ref 3.5–5.3)
Sodium: 140 mmol/L (ref 135–146)
Total Bilirubin: 0.6 mg/dL (ref 0.2–1.2)
Total Protein: 7.1 g/dL (ref 6.1–8.1)
eGFR: 78 mL/min/{1.73_m2} (ref >=60)

## 2022-07-05 LAB — CBC WITH DIFFERENTIAL/PLATELET
Absolute Monocytes: 813 cells/uL (ref 200–950)
Basophils Absolute: 54 cells/uL (ref 0–200)
Basophils Relative: 0.5 %
Eosinophils Absolute: 171 cells/uL (ref 15–500)
Eosinophils Relative: 1.6 %
HCT: 42.4 % (ref 35.0–45.0)
Hemoglobin: 14.9 g/dL (ref 11.7–15.5)
Lymphs Abs: 2504 cells/uL (ref 850–3900)
MCH: 32.7 pg (ref 27.0–33.0)
MCHC: 35.1 g/dL (ref 32.0–36.0)
MCV: 93.2 fL (ref 80.0–100.0)
MPV: 9.6 fL (ref 7.5–12.5)
Monocytes Relative: 7.6 %
Neutro Abs: 7158 cells/uL (ref 1500–7800)
Neutrophils Relative %: 66.9 %
Platelets: 257 10*3/uL (ref 140–400)
RBC: 4.55 10*6/uL (ref 3.80–5.10)
RDW: 12.4 % (ref 11.0–15.0)
Total Lymphocyte: 23.4 %
WBC: 10.7 10*3/uL (ref 3.8–10.8)

## 2022-07-05 LAB — HEMOGLOBIN A1C
Hgb A1c MFr Bld: 6.2 % of total Hgb — ABNORMAL HIGH (ref <5.7)
Mean Plasma Glucose: 131 mg/dL
eAG (mmol/L): 7.3 mmol/L

## 2022-07-05 LAB — VITAMIN B12: Vitamin B-12: 497 pg/mL (ref 200–1100)

## 2022-07-05 LAB — VITAMIN D 25 HYDROXY (VIT D DEFICIENCY, FRACTURES): Vit D, 25-Hydroxy: 52 ng/mL (ref 30–100)

## 2022-07-07 LAB — CYTOLOGY - PAP
Adequacy: ABSENT
Comment: NEGATIVE
Diagnosis: NEGATIVE
High risk HPV: NEGATIVE

## 2022-07-18 ENCOUNTER — Ambulatory Visit (INDEPENDENT_AMBULATORY_CARE_PROVIDER_SITE_OTHER): Payer: BC Managed Care – PPO | Admitting: Obstetrics & Gynecology

## 2022-07-18 ENCOUNTER — Encounter: Payer: Self-pay | Admitting: Obstetrics & Gynecology

## 2022-07-18 VITALS — BP 122/66 | Wt 196.0 lb

## 2022-07-18 DIAGNOSIS — Z30433 Encounter for removal and reinsertion of intrauterine contraceptive device: Secondary | ICD-10-CM | POA: Diagnosis not present

## 2022-07-18 MED ORDER — LEVONORGESTREL 20.1 MCG/DAY IU IUD
1.0000 | INTRAUTERINE_SYSTEM | Freq: Once | INTRAUTERINE | Status: AC
  Administered 2022-07-18: 1 via INTRAUTERINE

## 2022-07-18 NOTE — Progress Notes (Signed)
IUD Insertion Procedure Note  Pre-operative Diagnosis: removal and reinsertion of IUD  Post-operative Diagnosis: normal  Indications: contraception, levonorgestral  Procedure Details  Urine pregnancy test was done 07/18/2022 and result was neg.  The risks (including infection, bleeding, pain, and uterine perforation) and benefits of the procedure were explained to the patient and  both  informed consent was obtained.     IUD Information: Expiration date   Condition: Stable  Complications: None  Plan:  The patient was advised to call for any fever or for prolonged or severe pain or bleeding. She was advised to use OTC acetaminophen as needed for mild to moderate pain.   Attending Physician Documentation: I was present for or performed the following: exam Encounter for removal and reinsertion of  IUD  Linzie Collin, MD  08/22/2022 4:55 AM

## 2022-07-21 ENCOUNTER — Other Ambulatory Visit: Payer: Self-pay | Admitting: Family Medicine

## 2022-07-21 DIAGNOSIS — F411 Generalized anxiety disorder: Secondary | ICD-10-CM

## 2022-07-22 ENCOUNTER — Telehealth: Payer: Self-pay | Admitting: Family Medicine

## 2022-07-22 ENCOUNTER — Other Ambulatory Visit: Payer: Self-pay | Admitting: Family Medicine

## 2022-07-22 DIAGNOSIS — F411 Generalized anxiety disorder: Secondary | ICD-10-CM

## 2022-07-22 MED ORDER — ALPRAZOLAM 0.5 MG PO TABS: 0.5000 mg | ORAL_TABLET | Freq: Every day | ORAL | 0 refills | Status: DC | PRN

## 2022-07-22 MED ORDER — HYDROXYZINE HCL 25 MG PO TABS: 25.0000 mg | ORAL_TABLET | Freq: Every evening | ORAL | 0 refills | Status: DC | PRN

## 2022-07-22 NOTE — Telephone Encounter (Unsigned)
Copied from CRM [id number]. Topic: General - Other >> Jul 22, 2022  1:11 PM Destiny Atkinson wrote: Reason for CRM: Pt requesting a return call regarding the refusal of  ALPRAZolam (XANAX) 0.5 MG  refill  Pt states she can not wait until her next appt to have medication filled  Please fu w/ pt

## 2022-07-25 NOTE — Progress Notes (Signed)
Subjective:     Destiny Atkinson is a 46 y.o. [id number], female here for a routine exam.  Current complaints: .  Personal has had IUD now for 4 years and states that she has not had her menstrual cycle since. Heavy bleeding w/clots last 5 days, cramping and back pain health questionnaire reviewed: yes.   Gynecologic History No LMP recorded. (Menstrual status: IUD). Contraception: IUD Last Pap: 01/29/2018. Results were: normal Last mammogram: not documented. Results were: not documented  Obstetric History OB History  Gravida Para Term Preterm AB Living  2 2 2     2   SAB IAB Ectopic Multiple Live Births          2    # Outcome Date GA Lbr Len/2nd Weight Sex Delivery Anes PTL Lv  2 Term 06/29/07   6 lb 8.5 oz (2.963 kg) F Vag-Spont   LIV  1 Term 06/26/02   6 lb 5 oz (2.863 kg) F Vag-Spont   LIV     The following portions of the patient's history were reviewed and updated as appropriate: allergies, current medications, past family history, past medical history, past social history, past surgical history, and problem list.  Review of Systems A comprehensive review of systems was negative.    Objective:    BP 100/60   Ht 5\' 7"  (1.702 m)   Wt 196 lb 12.8 oz (89.3 kg)   BMI 30.82 kg/m  General appearance: alert, cooperative, and no distress Abdomen: normal findings: no organomegaly and soft, non-tender Pelvic: cervix normal in appearance, external genitalia normal, no adnexal masses or tenderness, no cervical motion tenderness, uterus normal size, shape, and consistency, and vagina normal without discharge Extremities: extremities normal, atraumatic, no cyanosis or edema Skin: Skin color, texture, turgor normal. No rashes or lesions or normal, mobility and turgor normal, nails clear without discoloration or sign of infection, and no edema    Assessment:   Healthy female exam. Menstrual suppression Episode of menorrhagia  PAP done     Plan:  RTO for IUD removal and  re-insertion  08/26/02, MD  07/25/2022 11:38 PM

## 2022-07-26 ENCOUNTER — Other Ambulatory Visit: Payer: Self-pay | Admitting: Family Medicine

## 2022-07-26 DIAGNOSIS — F411 Generalized anxiety disorder: Secondary | ICD-10-CM

## 2022-07-26 DIAGNOSIS — F321 Major depressive disorder, single episode, moderate: Secondary | ICD-10-CM

## 2022-07-26 DIAGNOSIS — I10 Essential (primary) hypertension: Secondary | ICD-10-CM

## 2022-07-26 DIAGNOSIS — I152 Hypertension secondary to endocrine disorders: Secondary | ICD-10-CM

## 2022-08-01 ENCOUNTER — Other Ambulatory Visit: Payer: Self-pay | Admitting: Obstetrics & Gynecology

## 2022-08-01 DIAGNOSIS — Z975 Presence of (intrauterine) contraceptive device: Secondary | ICD-10-CM

## 2022-08-03 NOTE — Progress Notes (Signed)
Name: Destiny Atkinson   MRN: [id number]    DOB: [birth year]   Date:08/04/2022       Progress Note  Subjective  Chief Complaint  Follow Up  HPI  Migraine : episodes not as frequent  ,down to once every couple of months. Migraine is not ssociated nausea or vomiting, but is associated with photophobia. Migraine is described as a sharp pain over left eye and radiates to left temporal area and sometimes nuchal area. Takes Maxalt first and sometimes Ubrelvy    HTN: pm Losartan and tolerating it well, bp is at goal    GAD/Major Depression: she left [employer] and started a new job at [employer] in [place] back in May 2017, she left [employer] March 2018 when she walked away from her job,  and was unemployed until 06/2017. 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 She has been taking lexapro daily, hydroxizine occasionally for sleep but feels groggy during the day and no longer keeps her asleep. Work has been stressful and feels like she needs to go up on the amount of alprazolam's to last 3 months. She used to get 30 per 90 days and we gradually went down on amount, she has been getting 18 pills to last 3 months.   We will try seroquel for sleep and add buspar to take once a day prn. Not increasing amount of alprazolam at this time   DMII:   A1C was 6.2 % in August 2023  She has been having polydipsia or polyuria but no polyphagia . She has associated HTN, dyslipidemia and obesity. She tried Rybelsus, took it for 2 months but unable to tolerate nausea, we gave her Wilder Glade but stopped  due to yeast infection. Currently on diet only. Fasting glucose was above goal    RLS/Peripheral Neuropathy: She has a need to move her legs when sitting down, also states that at night her legs moves constantly. Numbness had been intermittent on hands and feet. She was on Requip but stopped working after 6 months, she has failed gabapentin, currently on Mirapex but takes hours to starts to  work. She would like to go back on Requip    AR: she his allergic to cats, but has 3 cats at home, also allergic to dogs and has one of them. She tries to keep them out of her bedroom. Taking anti-histamines, we will consider adding singulair soon    Asthma/COPD: she still smokes, about 10 per day cigarettes daily. She is unable to tolerate Brezti or Trelegy because it causes thrush, discussed Anoro. She has noticed some cough, wheezing  and sob since she has been off medication, about 6 months ago. She states symptoms are worse due to the season changing   Dyslipidemia: reviewed last labs with patient again and reminded her LDL goal is below 70 , last LDL high again. She does not want to take medications at this time   Vitamin D deficiency: she has been compliant with vitamin D weekly and last level at goal   Patient Active Problem List   Diagnosis Date Noted   Encounter for removal and reinsertion of intrauterine contraceptive device (IUD) 07/18/2022   COPD with asthma ([place]) 01/19/2022   Hypertension associated with type 2 diabetes mellitus ([place]) 10/26/2021   Moderate major depression ([place]) 10/26/2021   Vitamin D deficiency 12/28/2016   Mild major depression ([place]) 11/17/2015   GAD (generalized anxiety disorder) 05/06/2015   Asthma, moderate  persistent 05/06/2015   Mobitz type I incomplete atrioventricular block 05/06/2015   B12 deficiency 05/06/2015   Essential (primary) hypertension 05/06/2015   Irregular bleeding 05/06/2015   Migraine without aura and responsive to treatment 05/06/2015   BMI 31.0-31.9,adult 05/06/2015   Peripheral neuropathy 05/06/2015   Perennial allergic rhinitis 05/06/2015   Restless leg 05/06/2015   Tobacco abuse 05/06/2015    Past Surgical History:  Procedure Laterality Date   COLONOSCOPY     in 8th grade, not as adult   ESOPHAGOGASTRODUODENOSCOPY     age 21   NASAL SINUS SURGERY  01/21/2010   septoplasty/ sinus surgery   WISDOM TOOTH EXTRACTION  age  63    Family History  Problem Relation Age of Onset   COPD Mother    Fibromyalgia Mother    Hypertension Mother    Stroke Mother    Asthma Mother    Arthritis Mother    Depression Mother    Heart disease Mother    Crohn's disease Mother    Diabetes Father    Epilepsy Brother    Diabetes type II Daughter    Stomach cancer Paternal Grandmother 55   Ovarian cancer Other 46   Ovarian cancer Other 72   Diabetes type II Daughter    Kidney Stones Daughter    Bipolar disorder Maternal Grandmother     Social History   Tobacco Use   Smoking status: Every Day    Packs/day: 0.25    Years: 24.00    Total pack years: 6.00    Types: Cigarettes, E-cigarettes    Start date: 06/29/1995   Smokeless tobacco: Never  Substance Use Topics   Alcohol use: Yes    Alcohol/week: 0.0 standard drinks of alcohol    Comment: rarely     Current Outpatient Medications:    albuterol (VENTOLIN HFA) 108 (90 Base) MCG/ACT inhaler, USE 2 INHALATIONS EVERY 6 HOURS AS NEEDED FOR WHEEZING OR SHORTNESS OF BREATH, Disp: 17 g, Rfl: 0   ALPRAZolam (XANAX) 0.5 MG tablet, Take 1 tablet (0.5 mg total) by mouth daily as needed for anxiety., Disp: 18 tablet, Rfl: 0   Cyanocobalamin (VITAMIN B12 PO), Take by mouth., Disp: , Rfl:    escitalopram (LEXAPRO) 20 MG tablet, TAKE 1 TABLET DAILY, Disp: 90 tablet, Rfl: 0   hydrOXYzine (ATARAX) 25 MG tablet, Take 1 tablet (25 mg total) by mouth at bedtime as needed., Disp: 90 tablet, Rfl: 0   ipratropium-albuterol (DUONEB) 0.5-2.5 (3) MG/3ML SOLN, Take 3 mLs by nebulization every 6 (six) hours as needed., Disp: 360 mL, Rfl: 1   levocetirizine (XYZAL) 5 MG tablet, Take 1 tablet (5 mg total) by mouth daily., Disp: 90 tablet, Rfl: 1   loratadine (CLARITIN) 10 MG tablet, Take 10 mg by mouth daily., Disp: , Rfl:    losartan (COZAAR) 25 MG tablet, TAKE 1 TABLET DAILY, Disp: 90 tablet, Rfl: 0   ondansetron (ZOFRAN) 4 MG tablet, Take 1 tablet (4 mg total) by mouth every 8 (eight) hours  as needed for nausea or vomiting., Disp: 20 tablet, Rfl: 0   rizatriptan (MAXALT-MLT) 10 MG disintegrating tablet, Take 1 tablet (10 mg total) by mouth as needed for migraine. May repeat in 2 hours if needed, Disp: 27 tablet, Rfl: 1   rOPINIRole (REQUIP) 1 MG tablet, TAKE 1 TABLET AT BEDTIME, Disp: 90 tablet, Rfl: 0   Ubrogepant (UBRELVY) 100 MG TABS, Take 1 tablet by mouth daily as needed., Disp: 10 tablet, Rfl: 2   Vitamin D, Ergocalciferol, (  DRISDOL) 1.25 MG (50000 UNIT) CAPS capsule, TAKE 1 CAPSULE ONCE WEEKLY, Disp: 12 capsule, Rfl: 0   Levonorgestrel (LILETTA, 52 MG,) 19.5 MCG/DAY IUD IUD, 1 Intra Uterine Device (1 each total) by Intrauterine route once for 1 dose., Disp: 1 each, Rfl: 0   loperamide (IMODIUM) 2 MG capsule, Take 1 capsule (2 mg total) by mouth 4 (four) times daily as needed for diarrhea or loose stools. (Patient not taking: Reported on 08/04/2022), Disp: 12 capsule, Rfl: 0   nystatin (MYCOSTATIN) 100000 UNIT/ML suspension, Take 5 mLs (500,000 Units total) by mouth 4 (four) times daily. (Patient not taking: Reported on 08/04/2022), Disp: 60 mL, Rfl: 0  Allergies  Allergen Reactions   Dog Epithelium     Anaphylaxis with Rabbits.  Dogs, cats, birds cause swelling, extreme itching.   Dust Mite Extract    Pollen Extract    Soap    Tape    Tree Extract     I personally reviewed active problem list, medication list, allergies, family history, social history, health maintenance with the patient/caregiver today.   ROS  Constitutional: Negative for fever or weight change.  Respiratory: positive for cough and shortness of breath.   Cardiovascular: Negative for chest pain or palpitations.  Gastrointestinal: Negative for abdominal pain, no bowel changes.  Musculoskeletal: Negative for gait problem or joint swelling.  Skin: Negative for rash.  Neurological: Negative for dizziness , positive for intermittent  headache.  No other specific complaints in a complete review of systems  (except as listed in HPI above).   Objective  Vitals:   08/04/22 0935  BP: 118/70  Pulse: 95  Resp: 16  SpO2: 99%  Weight: 196 lb (88.9 kg)  Height: $Remove'5\' 7"'KsvVBbZ$  (1.702 m)    Body mass index is 30.7 kg/m.  Physical Exam  Constitutional: Patient appears well-developed and well-nourished. Obese  No distress.  HEENT: head atraumatic, normocephalic, pupils equal and reactive to light,, neck supple Cardiovascular: Normal rate, regular rhythm and normal heart sounds.  No murmur heard. No BLE edema. Pulmonary/Chest: Effort normal and breath sounds normal. No respiratory distress. Abdominal: Soft.  There is no tenderness. Psychiatric: Patient has a normal mood and affect. behavior is normal. Judgment and thought content normal.   Recent Results (from the past 2160 hour(s))  Lipid panel     Status: Abnormal   Collection Time: 07/04/22  3:14 PM  Result Value Ref Range   Cholesterol 231 (H) <200 mg/dL   HDL 49 (L) > OR = 50 mg/dL   Triglycerides 298 (H) <150 mg/dL    Comment: . If a non-fasting specimen was collected, consider repeat triglyceride testing on a fasting specimen if clinically indicated.  Yates Decamp et al. J. of Clin. Lipidol. 3664;4:034-742. Marland Kitchen    LDL Cholesterol (Calc) 139 (H) mg/dL (calc)    Comment: Reference range: <100 . Desirable range <100 mg/dL for primary prevention;   <70 mg/dL for patients with CHD or diabetic patients  with > or = 2 CHD risk factors. Marland Kitchen LDL-C is now calculated using the Martin-Hopkins  calculation, which is a validated novel method providing  better accuracy than the Friedewald equation in the  estimation of LDL-C.  Cresenciano Genre et al. Annamaria Helling. 5956;387(56): 2061-2068  (http://education.[facility].com/faq/FAQ164)    Total CHOL/HDL Ratio 4.7 <5.0 (calc)   Non-HDL Cholesterol (Calc) 182 (H) <130 mg/dL (calc)    Comment: For patients with diabetes plus 1 major ASCVD risk  factor, treating to a non-HDL-C goal of <100 mg/dL  (LDL-C of <70  mg/dL) is considered a therapeutic  option.   Microalbumin / creatinine urine ratio     Status: None   Collection Time: 07/04/22  3:14 PM  Result Value Ref Range   Creatinine, Urine 118 20 - 275 mg/dL   Microalb, Ur 0.3 mg/dL    Comment: Reference Range Not established    Microalb Creat Ratio 3 <30 mcg/mg creat    Comment: . The ADA defines abnormalities in albumin excretion as follows: Marland Kitchen Albuminuria Category        Result (mcg/mg creatinine) . Normal to Mildly increased   <30 Moderately increased         30-299  Severely increased           > OR = 300 . The ADA recommends that at least two of three specimens collected within a 3-6 month period be abnormal before considering a patient to be within a diagnostic category.   COMPLETE METABOLIC PANEL WITH GFR     Status: Abnormal   Collection Time: 07/04/22  3:14 PM  Result Value Ref Range   Glucose, Bld 128 (H) 65 - 99 mg/dL    Comment: .            Fasting reference interval . For someone without known diabetes, a glucose value >125 mg/dL indicates that they may have diabetes and this should be confirmed with a follow-up test. .    BUN 11 7 - 25 mg/dL   Creat 0.93 0.50 - 0.99 mg/dL   eGFR 78 > OR = 60 mL/min/1.60m2   BUN/Creatinine Ratio SEE NOTE: 6 - 22 (calc)    Comment:    Not Reported: BUN and Creatinine are within    reference range. .    Sodium 140 135 - 146 mmol/L   Potassium 4.4 3.5 - 5.3 mmol/L   Chloride 104 98 - 110 mmol/L   CO2 25 20 - 32 mmol/L   Calcium 9.6 8.6 - 10.2 mg/dL   Total Protein 7.1 6.1 - 8.1 g/dL   Albumin 4.7 3.6 - 5.1 g/dL   Globulin 2.4 1.9 - 3.7 g/dL (calc)   AG Ratio 2.0 1.0 - 2.5 (calc)   Total Bilirubin 0.6 0.2 - 1.2 mg/dL   Alkaline phosphatase (APISO) 68 31 - 125 U/L   AST 18 10 - 30 U/L   ALT 16 6 - 29 U/L  CBC with Differential/Platelet     Status: None   Collection Time: 07/04/22  3:14 PM  Result Value Ref Range   WBC 10.7 3.8 - 10.8 Thousand/uL   RBC 4.55 3.80 -  5.10 Million/uL   Hemoglobin 14.9 11.7 - 15.5 g/dL   HCT 42.4 35.0 - 45.0 %   MCV 93.2 80.0 - 100.0 fL   MCH 32.7 27.0 - 33.0 pg   MCHC 35.1 32.0 - 36.0 g/dL   RDW 12.4 11.0 - 15.0 %   Platelets 257 140 - 400 Thousand/uL   MPV 9.6 7.5 - 12.5 fL   Neutro Abs 7,158 1,500 - 7,800 cells/uL   Lymphs Abs 2,504 850 - 3,900 cells/uL   Absolute Monocytes 813 200 - 950 cells/uL   Eosinophils Absolute 171 15 - 500 cells/uL   Basophils Absolute 54 0 - 200 cells/uL   Neutrophils Relative % 66.9 %   Total Lymphocyte 23.4 %   Monocytes Relative 7.6 %   Eosinophils Relative 1.6 %   Basophils Relative 0.5 %  Hemoglobin A1c     Status: Abnormal   Collection Time:  07/04/22  3:14 PM  Result Value Ref Range   Hgb A1c MFr Bld 6.2 (H) <5.7 % of total Hgb    Comment: For someone without known diabetes, a hemoglobin  A1c value between 5.7% and 6.4% is consistent with prediabetes and should be confirmed with a  follow-up test. . For someone with known diabetes, a value <7% indicates that their diabetes is well controlled. A1c targets should be individualized based on duration of diabetes, age, comorbid conditions, and other considerations. . This assay result is consistent with an increased risk of diabetes. . Currently, no consensus exists regarding use of hemoglobin A1c for diagnosis of diabetes for children. .    Mean Plasma Glucose 131 mg/dL   eAG (mmol/L) 7.3 mmol/L  Vitamin B12     Status: None   Collection Time: 07/04/22  3:14 PM  Result Value Ref Range   Vitamin B-12 497 200 - 1,100 pg/mL  VITAMIN D 25 Hydroxy (Vit-D Deficiency, Fractures)     Status: None   Collection Time: 07/04/22  3:14 PM  Result Value Ref Range   Vit D, 25-Hydroxy 52 30 - 100 ng/mL    Comment: Vitamin D Status         25-OH Vitamin D: . Deficiency:                    <20 ng/mL Insufficiency:             20 - 29 ng/mL Optimal:                 > or = 30 ng/mL . For 25-OH Vitamin D testing on patients on   D2-supplementation and patients for whom quantitation  of D2 and D3 fractions is required, the [facility](TM) 25-OH VIT D, (D2,D3), LC/MS/MS is recommended: order  code [id number] (patients >18yrs). . See Note 1 . Note 1 . For additional information, please refer to  http://education.[facility].com/faq/FAQ199  (This link is being provided for informational/ educational purposes only.)   Cytology - PAP     Status: None   Collection Time: 07/05/22  2:03 PM  Result Value Ref Range   High risk HPV Negative    Adequacy      Satisfactory for evaluation; transformation zone component ABSENT.   Diagnosis      - Negative for intraepithelial lesion or malignancy (NILM)   Comment Normal Reference Range HPV - Negative     PHQ2/9:    08/04/2022    9:39 AM 07/04/2022    2:31 PM 04/28/2022    2:43 PM 01/19/2022    7:53 AM 10/26/2021    9:22 AM  Depression screen PHQ 2/9  Decreased Interest 1 2 0 1 2  Down, Depressed, Hopeless 1 2 0 1 2  PHQ - 2 Score 2 4 0 2 4  Altered sleeping $RemoveBeforeDE'3 2  1 2  'HhJQRuSHyIHbzMo$ Tired, decreased energy 0 $Remove'3  2 2  'yGRZKcp$ Change in appetite 0 $RemoveBe'2  2 2  'PgQzGBJSn$ Feeling bad or failure about yourself  2 0  2 1  Trouble concentrating 0 0  0 1  Moving slowly or fidgety/restless 0 0  0 0  Suicidal thoughts 0 0  0 0  PHQ-9 Score $RemoveBef'7 11  9 12  'esHnAAQSpq$ Difficult doing work/chores    Somewhat difficult     phq 9 is positive   Fall Risk:    08/04/2022    9:34 AM 07/04/2022    2:31 PM 04/28/2022    2:43 PM 01/19/2022  7:53 AM 10/26/2021    9:18 AM  Fall Risk   Falls in the past year? 1 1 0 1 1  Number falls in past yr: 0 0 0 0 0  Injury with Fall? 0 0 0 0 0  Risk for fall due to : No Fall Risks No Fall Risks No Fall Risks History of fall(s) No Fall Risks  Follow up Falls prevention discussed Falls prevention discussed Falls prevention discussed Falls evaluation completed;Education provided Falls prevention discussed      Functional Status Survey: Is the patient deaf or have difficulty hearing?: No Does  the patient have difficulty seeing, even when wearing glasses/contacts?: Yes Does the patient have difficulty concentrating, remembering, or making decisions?: No Does the patient have difficulty walking or climbing stairs?: No Does the patient have difficulty dressing or bathing?: No Does the patient have difficulty doing errands alone such as visiting a doctor's office or shopping?: No    Assessment & Plan  1. GAD (generalized anxiety disorder)  - escitalopram (LEXAPRO) 20 MG tablet; Take 1 tablet (20 mg total) by mouth daily.  Dispense: 90 tablet; Refill: 0 - busPIRone (BUSPAR) 5 MG tablet; Take 1 tablet (5 mg total) by mouth daily as needed.  Dispense: 90 tablet; Refill: 0  2. Moderate major depression (HCC)  - escitalopram (LEXAPRO) 20 MG tablet; Take 1 tablet (20 mg total) by mouth daily.  Dispense: 90 tablet; Refill: 0 - busPIRone (BUSPAR) 5 MG tablet; Take 1 tablet (5 mg total) by mouth daily as needed.  Dispense: 90 tablet; Refill: 0  3. Hypertension associated with type 2 diabetes mellitus (HCC)  - losartan (COZAAR) 25 MG tablet; Take 1 tablet (25 mg total) by mouth daily.  Dispense: 90 tablet; Refill: 0  4. Essential hypertension  - losartan (COZAAR) 25 MG tablet; Take 1 tablet (25 mg total) by mouth daily.  Dispense: 90 tablet; Refill: 0  5. Migraine without aura and responsive to treatment  - Ubrogepant (UBRELVY) 100 MG TABS; Take 1 tablet by mouth daily as needed.  Dispense: 10 tablet; Refill: 2  6. Vitamin D deficiency  - Vitamin D, Ergocalciferol, (DRISDOL) 1.25 MG (50000 UNIT) CAPS capsule; Take 1 capsule (50,000 Units total) by mouth every 7 (seven) days.  Dispense: 12 capsule; Refill: 1  7. B12 deficiency   8. RLS (restless legs syndrome)  - rOPINIRole (REQUIP) 1 MG tablet; Take 1 tablet (1 mg total) by mouth at bedtime.  Dispense: 90 tablet; Refill: 0  9. COPD with asthma ([place])  - umeclidinium-vilanterol (ANORO ELLIPTA) 62.5-25 MCG/ACT AEPB; Inhale 1  puff into the lungs daily at 6 (six) AM.  Dispense: 1 each; Refill: 2

## 2022-08-04 ENCOUNTER — Ambulatory Visit (INDEPENDENT_AMBULATORY_CARE_PROVIDER_SITE_OTHER): Payer: BC Managed Care – PPO

## 2022-08-04 ENCOUNTER — Ambulatory Visit (INDEPENDENT_AMBULATORY_CARE_PROVIDER_SITE_OTHER): Payer: BC Managed Care – PPO | Admitting: Family Medicine

## 2022-08-04 ENCOUNTER — Encounter: Payer: Self-pay | Admitting: Family Medicine

## 2022-08-04 VITALS — BP 118/70 | HR 95 | Resp 16 | Ht 67.0 in | Wt 196.0 lb

## 2022-08-04 DIAGNOSIS — I152 Hypertension secondary to endocrine disorders: Secondary | ICD-10-CM

## 2022-08-04 DIAGNOSIS — F321 Major depressive disorder, single episode, moderate: Secondary | ICD-10-CM

## 2022-08-04 DIAGNOSIS — G43009 Migraine without aura, not intractable, without status migrainosus: Secondary | ICD-10-CM

## 2022-08-04 DIAGNOSIS — E538 Deficiency of other specified B group vitamins: Secondary | ICD-10-CM | POA: Diagnosis not present

## 2022-08-04 DIAGNOSIS — F411 Generalized anxiety disorder: Secondary | ICD-10-CM

## 2022-08-04 DIAGNOSIS — E559 Vitamin D deficiency, unspecified: Secondary | ICD-10-CM

## 2022-08-04 DIAGNOSIS — E1159 Type 2 diabetes mellitus with other circulatory complications: Secondary | ICD-10-CM | POA: Diagnosis not present

## 2022-08-04 DIAGNOSIS — Z975 Presence of (intrauterine) contraceptive device: Secondary | ICD-10-CM

## 2022-08-04 DIAGNOSIS — G2581 Restless legs syndrome: Secondary | ICD-10-CM

## 2022-08-04 DIAGNOSIS — J449 Chronic obstructive pulmonary disease, unspecified: Secondary | ICD-10-CM

## 2022-08-04 DIAGNOSIS — Z30431 Encounter for routine checking of intrauterine contraceptive device: Secondary | ICD-10-CM

## 2022-08-04 DIAGNOSIS — I10 Essential (primary) hypertension: Secondary | ICD-10-CM

## 2022-08-04 MED ORDER — UBRELVY 100 MG PO TABS: 1.0000 | ORAL_TABLET | Freq: Every day | ORAL | 2 refills | Status: DC | PRN

## 2022-08-04 MED ORDER — VITAMIN D (ERGOCALCIFEROL) 1.25 MG (50000 UNIT) PO CAPS: 50000.0000 [IU] | ORAL_CAPSULE | ORAL | 1 refills | Status: DC

## 2022-08-04 MED ORDER — QUETIAPINE FUMARATE 25 MG PO TABS: 25.0000 mg | ORAL_TABLET | Freq: Every day | ORAL | 0 refills | Status: DC

## 2022-08-04 MED ORDER — UMECLIDINIUM-VILANTEROL 62.5-25 MCG/ACT IN AEPB: 1.0000 | INHALATION_SPRAY | Freq: Every day | RESPIRATORY_TRACT | 2 refills | Status: DC

## 2022-08-04 MED ORDER — LOSARTAN POTASSIUM 25 MG PO TABS: 25.0000 mg | ORAL_TABLET | Freq: Every day | ORAL | 0 refills | Status: DC

## 2022-08-04 MED ORDER — BUSPIRONE HCL 5 MG PO TABS: 5.0000 mg | ORAL_TABLET | Freq: Every day | ORAL | 0 refills | Status: DC | PRN

## 2022-08-04 MED ORDER — ROPINIROLE HCL 1 MG PO TABS: 1.0000 mg | ORAL_TABLET | Freq: Every day | ORAL | 0 refills | Status: DC

## 2022-08-04 MED ORDER — ESCITALOPRAM OXALATE 20 MG PO TABS: 20.0000 mg | ORAL_TABLET | Freq: Every day | ORAL | 0 refills | Status: DC

## 2022-08-05 ENCOUNTER — Other Ambulatory Visit: Payer: Self-pay | Admitting: Family Medicine

## 2022-08-17 ENCOUNTER — Ambulatory Visit
Admission: RE | Admit: 2022-08-17 | Discharge: 2022-08-17 | Disposition: A | Discharge status: Home / Self Care | Payer: BC Managed Care – PPO | Source: Ambulatory Visit | Attending: Family Medicine | Admitting: Family Medicine

## 2022-08-17 DIAGNOSIS — Z1231 Encounter for screening mammogram for malignant neoplasm of breast: Secondary | ICD-10-CM | POA: Insufficient documentation

## 2022-08-22 ENCOUNTER — Encounter: Payer: Self-pay | Admitting: Obstetrics & Gynecology

## 2022-08-24 ENCOUNTER — Telehealth: Payer: BC Managed Care – PPO | Admitting: Nurse Practitioner

## 2022-08-24 DIAGNOSIS — J40 Bronchitis, not specified as acute or chronic: Secondary | ICD-10-CM | POA: Diagnosis not present

## 2022-08-24 MED ORDER — BENZONATATE 100 MG PO CAPS: 100.0000 mg | ORAL_CAPSULE | Freq: Three times a day (TID) | ORAL | 0 refills | Status: DC | PRN

## 2022-08-24 MED ORDER — DOXYCYCLINE HYCLATE 100 MG PO TABS: 100.0000 mg | ORAL_TABLET | Freq: Two times a day (BID) | ORAL | 0 refills | Status: DC

## 2022-08-24 MED ORDER — PREDNISONE 20 MG PO TABS: 40.0000 mg | ORAL_TABLET | Freq: Every day | ORAL | 0 refills | Status: AC

## 2022-08-24 NOTE — Progress Notes (Signed)
We are sorry that you are not feeling well.  Here is how we plan to help!  Based on your presentation I believe you most likely have A cough due to bacteria.  When patients have a fever and a productive cough with a change in color or increased sputum production, we are concerned about bacterial bronchitis.  If left untreated it can progress to pneumonia.  If your symptoms do not improve with your treatment plan it is important that you contact your provider.   I have prescribed Doxycycline 100 mg twice a day for 7 days     In addition you may use Tessalon perles  Prednisone 20mg  2 tablets at same time daily for 5 days  From your responses in the eVisit questionnaire you describe inflammation in the upper respiratory tract which is causing a significant cough.  This is commonly called Bronchitis and has four common causes:   Allergies Viral Infections Acid Reflux Bacterial Infection Allergies, viruses and acid reflux are treated by controlling symptoms or eliminating the cause. An example might be a cough caused by taking certain blood pressure medications. You stop the cough by changing the medication. Another example might be a cough caused by acid reflux. Controlling the reflux helps control the cough.  USE OF BRONCHODILATOR ("RESCUE") INHALERS: There is a risk from using your bronchodilator too frequently.  The risk is that over-reliance on a medication which only relaxes the muscles surrounding the breathing tubes can reduce the effectiveness of medications prescribed to reduce swelling and congestion of the tubes themselves.  Although you feel brief relief from the bronchodilator inhaler, your asthma may actually be worsening with the tubes becoming more swollen and filled with mucus.  This can delay other crucial treatments, such as oral steroid medications. If you need to use a bronchodilator inhaler daily, several times per day, you should discuss this with your provider.  There are probably  better treatments that could be used to keep your asthma under control.     HOME CARE Only take medications as instructed by your medical team. Complete the entire course of an antibiotic. Drink plenty of fluids and get plenty of rest. Avoid close contacts especially the very young and the elderly Cover your mouth if you cough or cough into your sleeve. Always remember to wash your hands A steam or ultrasonic humidifier can help congestion.   GET HELP RIGHT AWAY IF: You develop worsening fever. You become short of breath You cough up blood. Your symptoms persist after you have completed your treatment plan MAKE SURE YOU  Understand these instructions. Will watch your condition. Will get help right away if you are not doing well or get worse.    Thank you for choosing an e-visit.  Your e-visit answers were reviewed by a board certified advanced clinical practitioner to complete your personal care plan. Depending upon the condition, your plan could have included both over the counter or prescription medications.  Please review your pharmacy choice. Make sure the pharmacy is open so you can pick up prescription now. If there is a problem, you may contact your provider through [employer] and have the prescription routed to another pharmacy.  Your safety is important to [place]. If you have drug allergies check your prescription carefully.   For the next 24 hours you can use MyChart to ask questions about today's visit, request a non-urgent call back, or ask for a work or school excuse. You will get an email in the  next two days asking about your experience. I hope that your e-visit has been valuable and will speed your recovery.  Mary-Margaret Daphine Deutscher, FNP   5-10 minutes spent reviewing and documenting in chart.

## 2022-08-25 ENCOUNTER — Other Ambulatory Visit: Payer: Self-pay | Admitting: *Deleted

## 2022-08-25 ENCOUNTER — Inpatient Hospital Stay
Admission: RE | Admit: 2022-08-25 | Discharge: 2022-08-25 | Disposition: A | Discharge status: Home / Self Care | Payer: Self-pay | Source: Ambulatory Visit | Attending: *Deleted | Admitting: *Deleted

## 2022-08-25 DIAGNOSIS — Z1231 Encounter for screening mammogram for malignant neoplasm of breast: Secondary | ICD-10-CM

## 2022-09-06 ENCOUNTER — Ambulatory Visit
Admission: RE | Admit: 2022-09-06 | Discharge: 2022-09-06 | Disposition: A | Discharge status: Home / Self Care | Payer: BC Managed Care – PPO | Source: Ambulatory Visit | Attending: Family Medicine | Admitting: Family Medicine

## 2022-09-06 ENCOUNTER — Ambulatory Visit
Admission: RE | Admit: 2022-09-06 | Discharge: 2022-09-06 | Disposition: A | Discharge status: Home / Self Care | Payer: BC Managed Care – PPO | Attending: Family Medicine | Admitting: Family Medicine

## 2022-09-06 ENCOUNTER — Encounter: Payer: Self-pay | Admitting: Family Medicine

## 2022-09-06 ENCOUNTER — Ambulatory Visit (INDEPENDENT_AMBULATORY_CARE_PROVIDER_SITE_OTHER): Payer: BC Managed Care – PPO | Admitting: Family Medicine

## 2022-09-06 VITALS — BP 112/70 | HR 94 | Temp 98.1°F | Resp 18 | Ht 67.0 in | Wt 195.0 lb

## 2022-09-06 DIAGNOSIS — B370 Candidal stomatitis: Secondary | ICD-10-CM | POA: Diagnosis not present

## 2022-09-06 DIAGNOSIS — R042 Hemoptysis: Secondary | ICD-10-CM

## 2022-09-06 DIAGNOSIS — J4541 Moderate persistent asthma with (acute) exacerbation: Secondary | ICD-10-CM | POA: Diagnosis not present

## 2022-09-06 DIAGNOSIS — R0989 Other specified symptoms and signs involving the circulatory and respiratory systems: Secondary | ICD-10-CM

## 2022-09-06 DIAGNOSIS — R059 Cough, unspecified: Secondary | ICD-10-CM | POA: Diagnosis not present

## 2022-09-06 DIAGNOSIS — J209 Acute bronchitis, unspecified: Secondary | ICD-10-CM | POA: Insufficient documentation

## 2022-09-06 MED ORDER — BREZTRI AEROSPHERE 160-9-4.8 MCG/ACT IN AERO: 2.0000 | INHALATION_SPRAY | Freq: Two times a day (BID) | RESPIRATORY_TRACT | 1 refills | Status: DC

## 2022-09-06 MED ORDER — PREDNISONE 20 MG PO TABS: ORAL_TABLET | ORAL | 0 refills | Status: DC

## 2022-09-06 MED ORDER — IPRATROPIUM-ALBUTEROL 0.5-2.5 (3) MG/3ML IN SOLN: 3.0000 mL | Freq: Three times a day (TID) | RESPIRATORY_TRACT | 2 refills | Status: AC | PRN

## 2022-09-06 MED ORDER — FLUCONAZOLE 150 MG PO TABS: 150.0000 mg | ORAL_TABLET | ORAL | 0 refills | Status: AC

## 2022-09-06 MED ORDER — HYDROCOD POLI-CHLORPHE POLI ER 10-8 MG/5ML PO SUER: 2.5000 mL | Freq: Two times a day (BID) | ORAL | 0 refills | Status: DC | PRN

## 2022-09-06 MED ORDER — ALBUTEROL SULFATE (2.5 MG/3ML) 0.083% IN NEBU
5.0000 mg | INHALATION_SOLUTION | Freq: Once | RESPIRATORY_TRACT | Status: AC
  Administered 2022-09-06: 5 mg via RESPIRATORY_TRACT

## 2022-09-06 MED ORDER — NYSTATIN 100000 UNIT/ML MT SUSP: 5.0000 mL | Freq: Four times a day (QID) | OROMUCOSAL | 2 refills | Status: DC | PRN

## 2022-09-06 NOTE — Progress Notes (Signed)
Patient ID: Destiny Atkinson, female    DOB: Dec 11, 1975, 46 y.o.   MRN: [id number]  PCP: Alba Cory, MD  Chief Complaint  Patient presents with   Cough    Since 08/19/22 has got worst, multiple COVID test Negatives. Pt states in the mornings coughs up blood w phelgm   Wheezing    Denies chest pain but feels chest sore of so much coughing    Subjective:   Destiny Atkinson is a 46 y.o. female, presents to clinic with CC of the following:  HPI  Pt presents with ongoing URI sx and bronchitis, onset about 2.5 weeks ago, really common for her to get an exacerbation around this time of year She did an evisit 13 days ago and was prescribed tessalon, doxycycline, prednisone - no improvement Tessalon is not effective for her She's tried mucinex and OTC cough meds It has gotten worse, mornings with the most productive sputum with hemoptysis, ribs sore from coughing so much  Maintenance inhalers were changed about a month ago due to thrush with breztri, trelegy same but not effective, Dr. Carlynn Purl switched her to [facility] and shortly after she got her URI sx with COPD exacerbation, since she didn't think anoro was helping she has stopped that and gone back to her Markus Daft and just had oral thrush  She denies fever, sweats, DOE worse than her baseline, mostly its coughing fits when its hard to catch her breath and she's out of the duonebs and albuterol rescue inhaler doesn't help her as much as duonebs She also asks for tussionex - she will hold her other sedating sleep meds at bedtime and take the cough syrup only - controlled substance database reviewed - confirm prior tussionex use - last year in October Otherwise she has xanax - which she tells me she will and dose hold when taking cough meds   Patient Active Problem List   Diagnosis Date Noted   COPD with asthma 01/19/2022   Hypertension associated with type 2 diabetes mellitus (HCC) 10/26/2021   Moderate major depression (HCC) 10/26/2021    Vitamin D deficiency 12/28/2016   Mild major depression (HCC) 11/17/2015   GAD (generalized anxiety disorder) 05/06/2015   Asthma, moderate persistent 05/06/2015   Mobitz type I incomplete atrioventricular block 05/06/2015   B12 deficiency 05/06/2015   Essential (primary) hypertension 05/06/2015   Irregular bleeding 05/06/2015   Migraine without aura and responsive to treatment 05/06/2015   BMI 31.0-31.9,adult 05/06/2015   Peripheral neuropathy 05/06/2015   Perennial allergic rhinitis 05/06/2015   Restless leg 05/06/2015   Tobacco abuse 05/06/2015      Current Outpatient Medications:    albuterol (VENTOLIN HFA) 108 (90 Base) MCG/ACT inhaler, USE 2 INHALATIONS EVERY 6 HOURS AS NEEDED FOR WHEEZING OR SHORTNESS OF BREATH, Disp: 17 g, Rfl: 0   ALPRAZolam (XANAX) 0.5 MG tablet, Take 1 tablet (0.5 mg total) by mouth daily as needed for anxiety., Disp: 18 tablet, Rfl: 0   benzonatate (TESSALON PERLES) 100 MG capsule, Take 1 capsule (100 mg total) by mouth 3 (three) times daily as needed for cough., Disp: 20 capsule, Rfl: 0   busPIRone (BUSPAR) 5 MG tablet, Take 1 tablet (5 mg total) by mouth daily as needed., Disp: 90 tablet, Rfl: 0   Cyanocobalamin (VITAMIN B12 PO), Take by mouth., Disp: , Rfl:    doxycycline (VIBRA-TABS) 100 MG tablet, Take 1 tablet (100 mg total) by mouth 2 (two) times daily. 1 po bid, Disp: 20 tablet, Rfl: 0  escitalopram (LEXAPRO) 20 MG tablet, Take 1 tablet (20 mg total) by mouth daily., Disp: 90 tablet, Rfl: 0   levocetirizine (XYZAL) 5 MG tablet, Take 1 tablet (5 mg total) by mouth daily., Disp: 90 tablet, Rfl: 1   levonorgestrel (LILETTA) 20.1 MCG/DAY IUD IUD, 1 each by Intrauterine route once., Disp: , Rfl:    loratadine (CLARITIN) 10 MG tablet, Take 10 mg by mouth daily., Disp: , Rfl:    losartan (COZAAR) 25 MG tablet, Take 1 tablet (25 mg total) by mouth daily., Disp: 90 tablet, Rfl: 0   QUEtiapine (SEROQUEL) 25 MG tablet, Take 1 tablet (25 mg total) by mouth  at bedtime., Disp: 90 tablet, Rfl: 0   rizatriptan (MAXALT-MLT) 10 MG disintegrating tablet, Take 1 tablet (10 mg total) by mouth as needed for migraine. May repeat in 2 hours if needed, Disp: 27 tablet, Rfl: 1   rOPINIRole (REQUIP) 1 MG tablet, Take 1 tablet (1 mg total) by mouth at bedtime., Disp: 90 tablet, Rfl: 0   Ubrogepant (UBRELVY) 100 MG TABS, Take 1 tablet by mouth daily as needed., Disp: 10 tablet, Rfl: 2   umeclidinium-vilanterol (ANORO ELLIPTA) 62.5-25 MCG/ACT AEPB, Inhale 1 puff into the lungs daily at 6 (six) AM., Disp: 1 each, Rfl: 2   Vitamin D, Ergocalciferol, (DRISDOL) 1.25 MG (50000 UNIT) CAPS capsule, Take 1 capsule (50,000 Units total) by mouth every 7 (seven) days., Disp: 12 capsule, Rfl: 1   Allergies  Allergen Reactions   Dog Epithelium     Anaphylaxis with Rabbits.  Dogs, cats, birds cause swelling, extreme itching.   Dust Mite Extract    Pollen Extract    Soap    Tape    Tree Extract      Social History   Tobacco Use   Smoking status: Every Day    Packs/day: 0.25    Years: 24.00    Total pack years: 6.00    Types: Cigarettes, E-cigarettes    Start date: 06/29/1995   Smokeless tobacco: Never  Vaping Use   Vaping Use: Former   Start date: 02/14/2015   Quit date: 11/15/2018  Substance Use Topics   Alcohol use: Yes    Alcohol/week: 0.0 standard drinks of alcohol    Comment: rarely   Drug use: No      Chart Review Today: I personally reviewed active problem list, medication list, allergies, family history, social history, health maintenance, notes from last encounter, lab results, imaging with the patient/caregiver today.   Review of Systems  Constitutional: Negative.   HENT: Negative.    Eyes: Negative.   Respiratory: Negative.    Cardiovascular: Negative.   Gastrointestinal: Negative.   Endocrine: Negative.   Genitourinary: Negative.   Musculoskeletal: Negative.   Skin: Negative.   Allergic/Immunologic: Negative.   Neurological: Negative.    Hematological: Negative.   Psychiatric/Behavioral: Negative.    All other systems reviewed and are negative.      Objective:   Vitals:   09/06/22 1036  BP: 112/70  Pulse: 94  Resp: 18  Temp: 98.1 F (36.7 C)  TempSrc: Oral  SpO2: 95%  Weight: 195 lb (88.5 kg)  Height: 5\' 7"  (1.702 m)    Body mass index is 30.54 kg/m.  Physical Exam Vitals and nursing note reviewed.  Constitutional:      General: She is not in acute distress.    Appearance: Normal appearance. She is well-developed and well-groomed. She is not ill-appearing, toxic-appearing or diaphoretic.     Interventions: Face mask  in place.  HENT:     Head: Normocephalic and atraumatic.     Mouth/Throat:     Comments: Matted white tongue (oral thrush)  Eyes:     General: No scleral icterus.       Right eye: No discharge.        Left eye: No discharge.     Conjunctiva/sclera: Conjunctivae normal.  Cardiovascular:     Rate and Rhythm: Normal rate and regular rhythm.     Heart sounds: Normal heart sounds.  Pulmonary:     Effort: Pulmonary effort is normal. No tachypnea, accessory muscle usage or respiratory distress.     Breath sounds: No stridor or transmitted upper airway sounds. Examination of the right-lower field reveals rales. Wheezing, rhonchi and rales present.     Comments: Severe coughing fits with deep inspiration Musculoskeletal:     Comments: scoliosis  Neurological:     Mental Status: She is alert.  Psychiatric:        Behavior: Behavior is cooperative.      Results for orders placed or performed in visit on 07/05/22  Cytology - PAP  Result Value Ref Range   High risk HPV Negative    Adequacy      Satisfactory for evaluation; transformation zone component ABSENT.   Diagnosis      - Negative for intraepithelial lesion or malignancy (NILM)   Comment Normal Reference Range HPV - Negative        Assessment & Plan:   1. Acute bronchitis, unspecified organism 2-3 weeks of sx, already did  doxy and pred 40 mg x 5 d  She had some changes in her maintenance inhaler and has been out of nebulizer treatments at home, her symptoms are not improved, she reports this is typical for her history around this time a year She was given a breathing treatment in clinic and reevaluated with improved breath sounds she is able to take deeper breaths and there was less rhonchi and improved breath sounds throughout she did continue to have coughing fits and wheeze with deep breathing Plan to do a longer steroid treatment with DuoNebs and cough medicine given - ipratropium-albuterol (DUONEB) 0.5-2.5 (3) MG/3ML SOLN; Take 3 mLs by nebulization 3 (three) times daily as needed (wheeze, coughing fits, chest tightness, SOB).  Dispense: 180 mL; Refill: 2 - predniSONE (DELTASONE) 20 MG tablet; 3 tabs poqday 1-3, 2 tabs poqday 4-6, 1 tab poqday 7-9  Dispense: 18 tablet; Refill: 0 - chlorpheniramine-HYDROcodone (TUSSIONEX) 10-8 MG/5ML; Take 2.5-5 mLs by mouth every 12 (twelve) hours as needed for cough.  Dispense: 115 mL; Refill: 0  2. Oral candidiasis Her maintenance inhalers that tend to work better for her chronic respiratory symptoms cause oral thrush chronically -currently has oral candidiasis -she can try a few doses of Diflucan in addition to the nystatin which she has at home already - fluconazole (DIFLUCAN) 150 MG tablet; Take 1 tablet (150 mg total) by mouth once a week for 4 doses.  Dispense: 4 tablet; Refill: 0 - nystatin (MYCOSTATIN) 100000 UNIT/ML suspension; Take 5 mLs (500,000 Units total) by mouth 4 (four) times daily as needed (swish and swallow for oral thrush).  Dispense: 60 mL; Refill: 2  3. Moderate persistent asthma with exacerbation She stopped Anoro and says it did not work for her and she has restarted Breztri and asked that that be sent into the pharmacy - ipratropium-albuterol (DUONEB) 0.5-2.5 (3) MG/3ML SOLN; Take 3 mLs by nebulization 3 (three) times daily as needed (wheeze,  coughing  fits, chest tightness, SOB).  Dispense: 180 mL; Refill: 2 - predniSONE (DELTASONE) 20 MG tablet; 3 tabs poqday 1-3, 2 tabs poqday 4-6, 1 tab poqday 7-9  Dispense: 18 tablet; Refill: 0 - Budeson-Glycopyrrol-Formoterol (BREZTRI AEROSPHERE) 160-9-4.8 MCG/ACT AERO; Inhale 2 puffs into the lungs in the morning and at bedtime.  Dispense: 1 each; Refill: 1   4. Abnormal lung sounds When patient was reexamined after albuterol nebulizer treatment she did have right basilar Rales -added chest x-ray -it may just be some atelectasis since she has been unable to do some deep breathing and she does have some scoliosis so her anatomy may also be contributory, chest x-ray to screen for pneumonia will add additional treatment if needed per rad report - DG Chest 2 View - albuterol (PROVENTIL) (2.5 MG/3ML) 0.083% nebulizer solution 5 mg  5. Hemoptysis In the am - likely secondary to bronchitis - DG Chest 2 View  Pt was encouraged to f/up with PCP regarding her preferences for changing maintenance inhalers  CXR added when pt walking out so other f/up may depend on those results If any worsening cough/SOB with this treatment and with nebs then she should go to UC or ED    Danelle Berry, PA-C 09/06/22 10:43 AM

## 2022-09-06 NOTE — Patient Instructions (Addendum)
If your symptoms do not improve or if they worsen then please get a follow up appointment or go to an urgent care   Please follow up soon with Dr. Ancil Boozer about your daily meds/asthma so that she can recheck you and refills your daily meds once you both determine what works best

## 2022-09-07 ENCOUNTER — Other Ambulatory Visit: Payer: Self-pay | Admitting: Family Medicine

## 2022-09-07 DIAGNOSIS — G2581 Restless legs syndrome: Secondary | ICD-10-CM

## 2022-10-03 ENCOUNTER — Other Ambulatory Visit: Payer: Self-pay | Admitting: Family Medicine

## 2022-10-03 DIAGNOSIS — F411 Generalized anxiety disorder: Secondary | ICD-10-CM

## 2022-10-11 ENCOUNTER — Other Ambulatory Visit: Payer: Self-pay | Admitting: Family Medicine

## 2022-10-11 NOTE — Progress Notes (Unsigned)
Name: Destiny Atkinson   MRN: [id number]    DOB: 29-Sep-1976   Date:10/12/2022       Progress Note  Subjective  Chief Complaint  Follow Up  HPI   Migraine : Migraine is not associated nausea or vomiting, but is associated with photophobia. Migraine is described as a sharp pain over left eye and radiates to left temporal area and sometimes nuchal area. Takes Maxalt first and sometimes Roselyn Meier but no episodes in a while    HTN: pm Losartan and tolerating it well, bp is at goal , denies dizziness, she has occasional palpitation and seems to be associated with anxiety    GAD/Major Depression: she left [employer] and started a new job at [employer] in [place] back in May 2017, she left [employer] March 2018 when she walked away from her job,  and was unemployed until 06/2017. She was back in [employer] for about one year, but switched to [facility] 07/2018. She has been taking lexapro daily, buspar and occasionally alprazolam, she is due for refill of 18 tablets to last 3 months on Nov 30 th, however her regular medications are not going to be filled until Dec 14th, we will give her 21 pills to last until mid March and all her medications can be in sink.  We gave her seroquel last visit to help her sleep and it has helped but depression and anxiety still not controlled so we will stop seroquel and start her on Vraylar to get depression in remission.   DMII:    She has been having polydipsia or polyuria but no polyphagia . She has associated HTN, dyslipidemia and obesity. She tried Rybelsus, took it for 2 months but unable to tolerate nausea, we gave her Wilder Glade but stopped  due to yeast infection. Currently on diet only. A1C still at goal at 6.5 % but trending up It was 6.7 % back in 2022 and last visit it was 6.2 %    RLS/Peripheral Neuropathy: She has a need to move her legs when sitting down, also states that at night her legs moves constantly. Numbness had been intermittent on hands and feet. She was on  Requip but stopped working after 6 months, she has failed gabapentin, she was on Mirapax on her last visit and had stopped working also , we switched back to Requip and she is sleeping better now, symptoms controlled    AR: she his allergic to cats, but has 3 cats at home, also allergic to dogs and has one of them. She tries to keep them out of her bedroom. Taking anti-histamines, she states they moved to a house that has hardwood floors and seems to have helped    Asthma/COPD: she still smokes, about 6 cigarettes per day and vapes the other times. She is back on Brezti since it works well for her and using nystatin to control oral candidiasis. She still has a cough and intermittent sob but no recent wheezing   Dyslipidemia: reviewed last labs with patient again and reminded her LDL goal is below 70 , last LDL high again. She does not want to take medications at this time Unchanged   Vitamin D deficiency: she has been compliant with vitamin D weekly   Patient Active Problem List   Diagnosis Date Noted   COPD with asthma 01/19/2022   Hypertension associated with type 2 diabetes mellitus ([place]) 10/26/2021   Moderate major depression ([place]) 10/26/2021   Vitamin D deficiency 12/28/2016   Mild  major depression ([place]) 11/17/2015   GAD (generalized anxiety disorder) 05/06/2015   Asthma, moderate persistent 05/06/2015   Mobitz type I incomplete atrioventricular block 05/06/2015   B12 deficiency 05/06/2015   Essential (primary) hypertension 05/06/2015   Irregular bleeding 05/06/2015   Migraine without aura and responsive to treatment 05/06/2015   BMI 31.0-31.9,adult 05/06/2015   Peripheral neuropathy 05/06/2015   Perennial allergic rhinitis 05/06/2015   Restless leg 05/06/2015   Tobacco abuse 05/06/2015    Past Surgical History:  Procedure Laterality Date   COLONOSCOPY     in 8th grade, not as adult   ESOPHAGOGASTRODUODENOSCOPY     age 40   NASAL SINUS SURGERY  01/21/2010   septoplasty/  sinus surgery   WISDOM TOOTH EXTRACTION  age 65    Family History  Problem Relation Age of Onset   COPD Mother    Fibromyalgia Mother    Hypertension Mother    Stroke Mother    Asthma Mother    Arthritis Mother    Depression Mother    Heart disease Mother    Crohn's disease Mother    Diabetes Father    Diabetes type II Daughter    Diabetes type II Daughter    Kidney Stones Daughter    Bipolar disorder Maternal Grandmother    Stomach cancer Paternal Grandmother 10   Epilepsy Brother    Ovarian cancer Other 60   Ovarian cancer Other 37   Breast cancer Neg Hx     Social History   Tobacco Use   Smoking status: Every Day    Packs/day: 0.25    Years: 24.00    Total pack years: 6.00    Types: Cigarettes, E-cigarettes    Start date: 06/29/1995   Smokeless tobacco: Never  Substance Use Topics   Alcohol use: Yes    Alcohol/week: 0.0 standard drinks of alcohol    Comment: rarely     Current Outpatient Medications:    albuterol (VENTOLIN HFA) 108 (90 Base) MCG/ACT inhaler, USE 2 INHALATIONS EVERY 6 HOURS AS NEEDED FOR WHEEZING OR SHORTNESS OF BREATH, Disp: 17 g, Rfl: 0   cariprazine (VRAYLAR) 1.5 MG capsule, Take 1 capsule (1.5 mg total) by mouth daily., Disp: 30 capsule, Rfl: 3   ipratropium-albuterol (DUONEB) 0.5-2.5 (3) MG/3ML SOLN, Take 3 mLs by nebulization 3 (three) times daily as needed (wheeze, coughing fits, chest tightness, SOB)., Disp: 180 mL, Rfl: 2   levocetirizine (XYZAL) 5 MG tablet, Take 1 tablet (5 mg total) by mouth daily., Disp: 90 tablet, Rfl: 1   levonorgestrel (LILETTA) 20.1 MCG/DAY IUD IUD, 1 each by Intrauterine route once., Disp: , Rfl:    loratadine (CLARITIN) 10 MG tablet, Take 10 mg by mouth daily., Disp: , Rfl:    nystatin (MYCOSTATIN) 100000 UNIT/ML suspension, Take 5 mLs (500,000 Units total) by mouth 4 (four) times daily as needed (swish and swallow for oral thrush)., Disp: 60 mL, Rfl: 2   rizatriptan (MAXALT-MLT) 10 MG disintegrating tablet, Take  1 tablet (10 mg total) by mouth as needed for migraine. May repeat in 2 hours if needed, Disp: 27 tablet, Rfl: 1   Ubrogepant (UBRELVY) 100 MG TABS, Take 1 tablet by mouth daily as needed., Disp: 10 tablet, Rfl: 2   Vitamin D, Ergocalciferol, (DRISDOL) 1.25 MG (50000 UNIT) CAPS capsule, Take 1 capsule (50,000 Units total) by mouth every 7 (seven) days., Disp: 12 capsule, Rfl: 1   ALPRAZolam (XANAX) 0.5 MG tablet, Take 1 tablet (0.5 mg total) by mouth daily as needed  for anxiety., Disp: 21 tablet, Rfl: 0   Budeson-Glycopyrrol-Formoterol (BREZTRI AEROSPHERE) 160-9-4.8 MCG/ACT AERO, Inhale 2 puffs into the lungs in the morning and at bedtime., Disp: 1 each, Rfl: 2   busPIRone (BUSPAR) 5 MG tablet, Take 1 tablet (5 mg total) by mouth daily as needed., Disp: 90 tablet, Rfl: 1   Cyanocobalamin (VITAMIN B12 PO), Take by mouth. (Patient not taking: Reported on 10/12/2022), Disp: , Rfl:    escitalopram (LEXAPRO) 20 MG tablet, Take 1 tablet (20 mg total) by mouth daily., Disp: 90 tablet, Rfl: 1   losartan (COZAAR) 25 MG tablet, Take 1 tablet (25 mg total) by mouth daily., Disp: 90 tablet, Rfl: 1   rOPINIRole (REQUIP) 1 MG tablet, Take 1 tablet (1 mg total) by mouth at bedtime., Disp: 90 tablet, Rfl: 1  Allergies  Allergen Reactions   Dog Epithelium     Anaphylaxis with Rabbits.  Dogs, cats, birds cause swelling, extreme itching.   Dust Mite Extract    Pollen Extract    Soap    Tape    Tree Extract     I personally reviewed active problem list, medication list, allergies, family history, social history, health maintenance with the patient/caregiver today.   ROS  Constitutional: Negative for fever or weight change.  Respiratory: positive  for cough and shortness of breath.   Cardiovascular: positive  for chest pain or palpitations usually when anxious .  Gastrointestinal: Negative for abdominal pain, no bowel changes.  Musculoskeletal: Negative for gait problem or joint swelling.  Skin: Negative  for rash.  Neurological: Negative for dizziness , seldom has headache.  No other specific complaints in a complete review of systems (except as listed in HPI above).   Objective  Vitals:   10/12/22 1044  BP: 112/70  Pulse: 87  Resp: 16  Temp: 99 F (37.2 C)  TempSrc: Oral  SpO2: 94%  Weight: 190 lb 14.4 oz (86.6 kg)  Height: 5\' 7"  (1.702 m)    Body mass index is 29.9 kg/m.  Physical Exam  Constitutional: Patient appears well-developed and well-nourished. No distress.  HEENT: head atraumatic, normocephalic, pupils equal and reactive to light, neck supple Cardiovascular: Normal rate, regular rhythm and normal heart sounds.  No murmur heard. No BLE edema. Pulmonary/Chest: Effort normal and breath sounds normal. No respiratory distress. Abdominal: Soft.  There is no tenderness. Psychiatric: Patient has a normal mood and affect. behavior is normal. Judgment and thought content normal.   Recent Results (from the past 2160 hour(s))  POCT HgB A1C     Status: Abnormal   Collection Time: 10/12/22 10:47 AM  Result Value Ref Range   Hemoglobin A1C 6.5 (A) 4.0 - 5.6 %   HbA1c POC (<> result, manual entry)     HbA1c, POC (prediabetic range)     HbA1c, POC (controlled diabetic range)       PHQ2/9:    10/12/2022   10:33 AM 09/06/2022   10:38 AM 08/04/2022    9:39 AM 07/04/2022    2:31 PM 04/28/2022    2:43 PM  Depression screen PHQ 2/9  Decreased Interest 2 0 1 2 0  Down, Depressed, Hopeless 2 0 1 2 0  PHQ - 2 Score 4 0 2 4 0  Altered sleeping 0 0 3 2   Tired, decreased energy 2 0 0 3   Change in appetite 2 0 0 2   Feeling bad or failure about yourself  0 0 2 0   Trouble concentrating 0 0  0 0   Moving slowly or fidgety/restless 0 0 0 0   Suicidal thoughts 0 0 0 0   PHQ-9 Score 8 0 7 11   Difficult doing work/chores Somewhat difficult Not difficult at all       phq 9 is positive     10/12/2022   11:07 AM 09/06/2022   10:38 AM 01/19/2022    7:54 AM 10/29/2020   11:37 AM   GAD 7 : Generalized Anxiety Score  Nervous, Anxious, on Edge 3 0 3 3  Control/stop worrying 1 0 1 2  Worry too much - different things 2 0 1 3  Trouble relaxing 3 0 2 3  Restless 1 0 3 1  Easily annoyed or irritable 3 0 2 2  Afraid - awful might happen 1 0 0 1  Total GAD 7 Score 14 0 12 15  Anxiety Difficulty Very difficult Not difficult at all Somewhat difficult Somewhat difficult       Fall Risk:    10/12/2022   10:32 AM 09/06/2022   10:38 AM 08/04/2022    9:34 AM 07/04/2022    2:31 PM 04/28/2022    2:43 PM  [place] in the past year? 1 1 1 1  0  Number falls in past yr: 0 0 0 0 0  Injury with Fall? 0 0 0 0 0  Risk for fall due to : Impaired balance/gait Impaired balance/gait;Impaired mobility No Fall Risks No Fall Risks No Fall Risks  Follow up Falls prevention discussed;Education provided;Falls evaluation completed Education provided;Falls prevention discussed Falls prevention discussed Falls prevention discussed Falls prevention discussed      Functional Status Survey: Is the patient deaf or have difficulty hearing?: No Does the patient have difficulty seeing, even when wearing glasses/contacts?: No Does the patient have difficulty concentrating, remembering, or making decisions?: Yes Does the patient have difficulty walking or climbing stairs?: No Does the patient have difficulty dressing or bathing?: No Does the patient have difficulty doing errands alone such as visiting a doctor's office or shopping?: No    Assessment & Plan  1. Hypertension associated with type 2 diabetes mellitus (HCC)  - POCT HgB A1C - losartan (COZAAR) 25 MG tablet; Take 1 tablet (25 mg total) by mouth daily.  Dispense: 90 tablet; Refill: 1  2. Colon cancer screening  - Cologuard  3. GAD (generalized anxiety disorder)  - ALPRAZolam (XANAX) 0.5 MG tablet; Take 1 tablet (0.5 mg total) by mouth daily as needed for anxiety.  Dispense: 21 tablet; Refill: 0 - busPIRone (BUSPAR) 5  MG tablet; Take 1 tablet (5 mg total) by mouth daily as needed.  Dispense: 90 tablet; Refill: 1 - escitalopram (LEXAPRO) 20 MG tablet; Take 1 tablet (20 mg total) by mouth daily.  Dispense: 90 tablet; Refill: 1  4. Moderate major depression (HCC)  - busPIRone (BUSPAR) 5 MG tablet; Take 1 tablet (5 mg total) by mouth daily as needed.  Dispense: 90 tablet; Refill: 1 - escitalopram (LEXAPRO) 20 MG tablet; Take 1 tablet (20 mg total) by mouth daily.  Dispense: 90 tablet; Refill: 1  5. Moderate persistent asthma with exacerbation  - Budeson-Glycopyrrol-Formoterol (BREZTRI AEROSPHERE) 160-9-4.8 MCG/ACT AERO; Inhale 2 puffs into the lungs in the morning and at bedtime.  Dispense: 1 each; Refill: 2  6. Essential hypertension  - losartan (COZAAR) 25 MG tablet; Take 1 tablet (25 mg total) by mouth daily.  Dispense: 90 tablet; Refill: 1  7. RLS (restless legs syndrome)  -  rOPINIRole (REQUIP) 1 MG tablet; Take 1 tablet (1 mg total) by mouth at bedtime.  Dispense: 90 tablet; Refill: 1  8. Vitamin D deficiency

## 2022-10-12 ENCOUNTER — Ambulatory Visit (INDEPENDENT_AMBULATORY_CARE_PROVIDER_SITE_OTHER): Payer: BC Managed Care – PPO | Admitting: Family Medicine

## 2022-10-12 ENCOUNTER — Encounter: Payer: Self-pay | Admitting: Family Medicine

## 2022-10-12 VITALS — BP 112/70 | HR 87 | Temp 99.0°F | Resp 16 | Ht 67.0 in | Wt 190.9 lb

## 2022-10-12 DIAGNOSIS — Z1211 Encounter for screening for malignant neoplasm of colon: Secondary | ICD-10-CM | POA: Diagnosis not present

## 2022-10-12 DIAGNOSIS — I152 Hypertension secondary to endocrine disorders: Secondary | ICD-10-CM

## 2022-10-12 DIAGNOSIS — I10 Essential (primary) hypertension: Secondary | ICD-10-CM

## 2022-10-12 DIAGNOSIS — E1159 Type 2 diabetes mellitus with other circulatory complications: Secondary | ICD-10-CM | POA: Diagnosis not present

## 2022-10-12 DIAGNOSIS — F321 Major depressive disorder, single episode, moderate: Secondary | ICD-10-CM

## 2022-10-12 DIAGNOSIS — J4541 Moderate persistent asthma with (acute) exacerbation: Secondary | ICD-10-CM

## 2022-10-12 DIAGNOSIS — F411 Generalized anxiety disorder: Secondary | ICD-10-CM

## 2022-10-12 DIAGNOSIS — G2581 Restless legs syndrome: Secondary | ICD-10-CM

## 2022-10-12 DIAGNOSIS — E559 Vitamin D deficiency, unspecified: Secondary | ICD-10-CM

## 2022-10-12 LAB — POCT GLYCOSYLATED HEMOGLOBIN (HGB A1C): Hemoglobin A1C: 6.5 % — AB (ref 4.0–5.6)

## 2022-10-12 MED ORDER — ALPRAZOLAM 0.5 MG PO TABS: 0.5000 mg | ORAL_TABLET | Freq: Every day | ORAL | 0 refills | Status: DC | PRN

## 2022-10-12 MED ORDER — BUSPIRONE HCL 5 MG PO TABS: 5.0000 mg | ORAL_TABLET | Freq: Every day | ORAL | 1 refills | Status: DC | PRN

## 2022-10-12 MED ORDER — BREZTRI AEROSPHERE 160-9-4.8 MCG/ACT IN AERO: 2.0000 | INHALATION_SPRAY | Freq: Two times a day (BID) | RESPIRATORY_TRACT | 2 refills | Status: DC

## 2022-10-12 MED ORDER — ESCITALOPRAM OXALATE 20 MG PO TABS: 20.0000 mg | ORAL_TABLET | Freq: Every day | ORAL | 1 refills | Status: DC

## 2022-10-12 MED ORDER — CARIPRAZINE HCL 1.5 MG PO CAPS: 1.5000 mg | ORAL_CAPSULE | Freq: Every day | ORAL | 3 refills | Status: DC

## 2022-10-12 MED ORDER — LOSARTAN POTASSIUM 25 MG PO TABS: 25.0000 mg | ORAL_TABLET | Freq: Every day | ORAL | 1 refills | Status: DC

## 2022-10-12 MED ORDER — ROPINIROLE HCL 1 MG PO TABS: 1.0000 mg | ORAL_TABLET | Freq: Every day | ORAL | 1 refills | Status: DC

## 2022-10-17 ENCOUNTER — Ambulatory Visit: Payer: Self-pay | Admitting: *Deleted

## 2022-10-17 ENCOUNTER — Telehealth: Payer: BC Managed Care – PPO | Admitting: Physician Assistant

## 2022-10-17 DIAGNOSIS — W540XXA Bitten by dog, initial encounter: Secondary | ICD-10-CM | POA: Diagnosis not present

## 2022-10-17 DIAGNOSIS — R21 Rash and other nonspecific skin eruption: Secondary | ICD-10-CM | POA: Diagnosis not present

## 2022-10-17 MED ORDER — AMOXICILLIN-POT CLAVULANATE 875-125 MG PO TABS: 1.0000 | ORAL_TABLET | Freq: Two times a day (BID) | ORAL | 0 refills | Status: AC

## 2022-10-17 MED ORDER — NAPROXEN 500 MG PO TABS: 500.0000 mg | ORAL_TABLET | Freq: Two times a day (BID) | ORAL | 0 refills | Status: AC

## 2022-10-17 NOTE — Telephone Encounter (Signed)
  Chief Complaint: Dog bite Symptoms: Pt's dog and landlords dog fighting, pt night  on right hand and right knee. Skin tear right hand, two 1/2 deep puncture wounds on knee.Areas red, painful. Tetanus up to date Frequency: LAst evening Pertinent Negatives: Patient denies fever, drainage Disposition: [] ED /[] Urgent Care (no appt availability in office) / [] Appointment(In office/virtual)/ [x]  [facility]/ [] Home Care/ [] Refused Recommended Disposition /[] [facility]/ []  Follow-up with PCP Additional Notes: No availability, [facility] secured  for pt. Care advise provided per protocol, pt verbalizes understanding.  Reason for Disposition  [1] Puncture wound or small cut AND [2] on hands or genitals  (Exception: Puncture  from small pet such as gerbil, mouse, hamster, puppy or turtle.)  Answer Assessment - Initial Assessment Questions 1. ANIMAL: "What type of animal caused the bite?" "Is the injury from a bite or a claw?" If the animal is a dog or a cat, ask: "Was it a pet or a stray?" "Was it acting ill or behaving strangely?"     Dog, owners and landlords 2. LOCATION: "Where is the bite located?"      Right knee and hand 3. SIZE: "How big is the bite?" "What does it look like?"      Puncture wounds, hand skin tear 4. ONSET: "When did the bite happen?" (Minutes or hours ago)      Last night 5. CIRCUMSTANCES: "Tell me how this happened."      Dogs fighting 6. TETANUS: "When was your last tetanus booster?"     Last 3 years 7. RABIES VACCINE: For dog or cat bites, ask: "Do you know if the pet is vaccinated against rabies?"  (e.g., yes, no, overdue for rabies shot, unknown)     Yes both  Protocols used: Animal Bite-A-AH

## 2022-10-17 NOTE — Patient Instructions (Signed)
Amala E Townley, thank you for joining [employer], PA-C for today's virtual visit.  While this provider is not your primary care provider (PCP), if your PCP is located in our provider database this encounter information will be shared with them immediately following your visit.   A [facility] MyChart account gives you access to today's visit and all your visits, tests, and labs performed at [facility] " click here if you don't have a [facility] MyChart account or go to [email or web address]  Consent: (Patient) Destiny Atkinson provided verbal consent for this virtual visit at the beginning of the encounter.  Current Medications:  Current Outpatient Medications:    amoxicillin-clavulanate (AUGMENTIN) 875-125 MG tablet, Take 1 tablet by mouth 2 (two) times daily for 7 days., Disp: 14 tablet, Rfl: 0   naproxen (NAPROSYN) 500 MG tablet, Take 1 tablet (500 mg total) by mouth 2 (two) times daily for 7 days., Disp: 14 tablet, Rfl: 0   albuterol (VENTOLIN HFA) 108 (90 Base) MCG/ACT inhaler, USE 2 INHALATIONS EVERY 6 HOURS AS NEEDED FOR WHEEZING OR SHORTNESS OF BREATH, Disp: 17 g, Rfl: 0   ALPRAZolam (XANAX) 0.5 MG tablet, Take 1 tablet (0.5 mg total) by mouth daily as needed for anxiety., Disp: 21 tablet, Rfl: 0   Budeson-Glycopyrrol-Formoterol (BREZTRI AEROSPHERE) 160-9-4.8 MCG/ACT AERO, Inhale 2 puffs into the lungs in the morning and at bedtime., Disp: 1 each, Rfl: 2   busPIRone (BUSPAR) 5 MG tablet, Take 1 tablet (5 mg total) by mouth daily as needed., Disp: 90 tablet, Rfl: 1   cariprazine (VRAYLAR) 1.5 MG capsule, Take 1 capsule (1.5 mg total) by mouth daily., Disp: 30 capsule, Rfl: 3   Cyanocobalamin (VITAMIN B12 PO), Take by mouth. (Patient not taking: Reported on 10/12/2022), Disp: , Rfl:    escitalopram (LEXAPRO) 20 MG tablet, Take 1 tablet (20 mg total) by mouth daily., Disp: 90 tablet, Rfl: 1   ipratropium-albuterol (DUONEB) 0.5-2.5 (3) MG/3ML SOLN, Take 3 mLs by  nebulization 3 (three) times daily as needed (wheeze, coughing fits, chest tightness, SOB)., Disp: 180 mL, Rfl: 2   levocetirizine (XYZAL) 5 MG tablet, Take 1 tablet (5 mg total) by mouth daily., Disp: 90 tablet, Rfl: 1   levonorgestrel (LILETTA) 20.1 MCG/DAY IUD IUD, 1 each by Intrauterine route once., Disp: , Rfl:    loratadine (CLARITIN) 10 MG tablet, Take 10 mg by mouth daily., Disp: , Rfl:    losartan (COZAAR) 25 MG tablet, Take 1 tablet (25 mg total) by mouth daily., Disp: 90 tablet, Rfl: 1   nystatin (MYCOSTATIN) 100000 UNIT/ML suspension, Take 5 mLs (500,000 Units total) by mouth 4 (four) times daily as needed (swish and swallow for oral thrush)., Disp: 60 mL, Rfl: 2   rizatriptan (MAXALT-MLT) 10 MG disintegrating tablet, Take 1 tablet (10 mg total) by mouth as needed for migraine. May repeat in 2 hours if needed, Disp: 27 tablet, Rfl: 1   rOPINIRole (REQUIP) 1 MG tablet, Take 1 tablet (1 mg total) by mouth at bedtime., Disp: 90 tablet, Rfl: 1   Ubrogepant (UBRELVY) 100 MG TABS, Take 1 tablet by mouth daily as needed., Disp: 10 tablet, Rfl: 2   Vitamin D, Ergocalciferol, (DRISDOL) 1.25 MG (50000 UNIT) CAPS capsule, Take 1 capsule (50,000 Units total) by mouth every 7 (seven) days., Disp: 12 capsule, Rfl: 1   Medications ordered in this encounter:  Meds ordered this encounter  Medications   amoxicillin-clavulanate (AUGMENTIN) 875-125 MG tablet    Sig: Take 1 tablet by  mouth 2 (two) times daily for 7 days.    Dispense:  14 tablet    Refill:  0    Order Specific Question:   Supervising Provider    Answer:   Chase Picket [id number]   naproxen (NAPROSYN) 500 MG tablet    Sig: Take 1 tablet (500 mg total) by mouth 2 (two) times daily for 7 days.    Dispense:  14 tablet    Refill:  0    Order Specific Question:   Supervising Provider    Answer:   Chase Picket [id number]     *If you need refills on other medications prior to your next appointment, please contact your  pharmacy*  Follow-Up: Call back or seek an in-person evaluation if the symptoms worsen or if the condition fails to improve as anticipated.  [facility] [phone]  Other Instructions You were given a prescription for antibiotics. Please take the antibiotic prescription fully.   Follow up for a wound check in 48 hours   Please go to urgent care or the emergency room immediately if you experience any new or worsening symptoms or any symptoms that indicate worsening infection such as fevers, increased redness/swelling/pain, warmth, or drainage from the affected area.    If you have been instructed to have an in-person evaluation today at a local Urgent Care facility, please use the link below. It will take you to a list of all of our available [facility], including address, phone number and hours of operation. Please do not delay care.  [facility]  If you or a family member do not have a primary care provider, use the link below to schedule a visit and establish care. When you choose a [facility] primary care physician or advanced practice provider, you gain a long-term partner in health. Find a Primary Care Provider  Learn more about 's in-office and virtual care options: [facility]

## 2022-10-17 NOTE — Progress Notes (Signed)
Virtual Visit Consent   Destiny Atkinson, you are scheduled for a virtual visit with a [facility] provider today. Just as with appointments in the office, your consent must be obtained to participate. Your consent will be active for this visit and any virtual visit you may have with one of our providers in the next 365 days. If you have a MyChart account, a copy of this consent can be sent to you electronically.  As this is a virtual visit, video technology does not allow for your provider to perform a traditional examination. This may limit your provider's ability to fully assess your condition. If your provider identifies any concerns that need to be evaluated in person or the need to arrange testing (such as labs, EKG, etc.), we will make arrangements to do so. Although advances in technology are sophisticated, we cannot ensure that it will always work on either your end or our end. If the connection with a video visit is poor, the visit may have to be switched to a telephone visit. With either a video or telephone visit, we are not always able to ensure that we have a secure connection.  By engaging in this virtual visit, you consent to the provision of healthcare and authorize for your insurance to be billed (if applicable) for the services provided during this visit. Depending on your insurance coverage, you may receive a charge related to this service.  I need to obtain your verbal consent now. Are you willing to proceed with your visit today? Destiny Atkinson has provided verbal consent on 10/17/2022 for a virtual visit (video or telephone). Karrie Meres, [place]  Date: 10/17/2022 11:13 AM  Virtual Visit via Video Note   I, Destiny Atkinson, connected with  Destiny Atkinson  ([id number], Jul 06, 1976) on 10/17/22 at 11:00 AM EST by a video-enabled telemedicine application and verified that I am speaking with the correct person using two identifiers.  Location: Patient: Virtual Visit Location  Patient: Other: car Provider: Virtual Visit Location Provider: Home Office   I discussed the limitations of evaluation and management by telemedicine and the availability of in person appointments. The patient expressed understanding and agreed to proceed.    History of Present Illness: Destiny Atkinson is a 46 y.o. who identifies as a female who was assigned female at birth, and is being seen today for dog bite.  Pt reports dog bite that occurred yesterday. She was bit by her landlord's dogs on her right hand and right knee. The dogs were a lab mix and husky mixed. The dogs vaccinations are UTD. Her Tdap is UTD.   States pain to her hand is mild and she just has pain mainly to the skin. Denies pain to the bones of her hand.   States wounds to the knee are deeper and located over the patella. Has some pain with rom of the knee.   HPI: HPI  Problems:  Patient Active Problem List   Diagnosis Date Noted   COPD with asthma 01/19/2022   Hypertension associated with type 2 diabetes mellitus (HCC) 10/26/2021   Moderate major depression (HCC) 10/26/2021   Vitamin D deficiency 12/28/2016   Mild major depression (HCC) 11/17/2015   GAD (generalized anxiety disorder) 05/06/2015   Asthma, moderate persistent 05/06/2015   Mobitz type I incomplete atrioventricular block 05/06/2015   B12 deficiency 05/06/2015   Essential (primary) hypertension 05/06/2015   Irregular bleeding 05/06/2015   Migraine without aura and responsive to treatment 05/06/2015  BMI 31.0-31.9,adult 05/06/2015   Peripheral neuropathy 05/06/2015   Perennial allergic rhinitis 05/06/2015   Restless leg 05/06/2015   Tobacco abuse 05/06/2015    Allergies:  Allergies  Allergen Reactions   Dog Epithelium     Anaphylaxis with Rabbits.  Dogs, cats, birds cause swelling, extreme itching.   Dust Mite Extract    Pollen Extract    Soap    Tape    Tree Extract    Medications:  Current Outpatient Medications:     amoxicillin-clavulanate (AUGMENTIN) 875-125 MG tablet, Take 1 tablet by mouth 2 (two) times daily for 7 days., Disp: 14 tablet, Rfl: 0   naproxen (NAPROSYN) 500 MG tablet, Take 1 tablet (500 mg total) by mouth 2 (two) times daily for 7 days., Disp: 14 tablet, Rfl: 0   albuterol (VENTOLIN HFA) 108 (90 Base) MCG/ACT inhaler, USE 2 INHALATIONS EVERY 6 HOURS AS NEEDED FOR WHEEZING OR SHORTNESS OF BREATH, Disp: 17 g, Rfl: 0   ALPRAZolam (XANAX) 0.5 MG tablet, Take 1 tablet (0.5 mg total) by mouth daily as needed for anxiety., Disp: 21 tablet, Rfl: 0   Budeson-Glycopyrrol-Formoterol (BREZTRI AEROSPHERE) 160-9-4.8 MCG/ACT AERO, Inhale 2 puffs into the lungs in the morning and at bedtime., Disp: 1 each, Rfl: 2   busPIRone (BUSPAR) 5 MG tablet, Take 1 tablet (5 mg total) by mouth daily as needed., Disp: 90 tablet, Rfl: 1   cariprazine (VRAYLAR) 1.5 MG capsule, Take 1 capsule (1.5 mg total) by mouth daily., Disp: 30 capsule, Rfl: 3   Cyanocobalamin (VITAMIN B12 PO), Take by mouth. (Patient not taking: Reported on 10/12/2022), Disp: , Rfl:    escitalopram (LEXAPRO) 20 MG tablet, Take 1 tablet (20 mg total) by mouth daily., Disp: 90 tablet, Rfl: 1   ipratropium-albuterol (DUONEB) 0.5-2.5 (3) MG/3ML SOLN, Take 3 mLs by nebulization 3 (three) times daily as needed (wheeze, coughing fits, chest tightness, SOB)., Disp: 180 mL, Rfl: 2   levocetirizine (XYZAL) 5 MG tablet, Take 1 tablet (5 mg total) by mouth daily., Disp: 90 tablet, Rfl: 1   levonorgestrel (LILETTA) 20.1 MCG/DAY IUD IUD, 1 each by Intrauterine route once., Disp: , Rfl:    loratadine (CLARITIN) 10 MG tablet, Take 10 mg by mouth daily., Disp: , Rfl:    losartan (COZAAR) 25 MG tablet, Take 1 tablet (25 mg total) by mouth daily., Disp: 90 tablet, Rfl: 1   nystatin (MYCOSTATIN) 100000 UNIT/ML suspension, Take 5 mLs (500,000 Units total) by mouth 4 (four) times daily as needed (swish and swallow for oral thrush)., Disp: 60 mL, Rfl: 2   rizatriptan  (MAXALT-MLT) 10 MG disintegrating tablet, Take 1 tablet (10 mg total) by mouth as needed for migraine. May repeat in 2 hours if needed, Disp: 27 tablet, Rfl: 1   rOPINIRole (REQUIP) 1 MG tablet, Take 1 tablet (1 mg total) by mouth at bedtime., Disp: 90 tablet, Rfl: 1   Ubrogepant (UBRELVY) 100 MG TABS, Take 1 tablet by mouth daily as needed., Disp: 10 tablet, Rfl: 2   Vitamin D, Ergocalciferol, (DRISDOL) 1.25 MG (50000 UNIT) CAPS capsule, Take 1 capsule (50,000 Units total) by mouth every 7 (seven) days., Disp: 12 capsule, Rfl: 1  Observations/Objective: Patient is well-developed, well-nourished in no acute distress.  Resting comfortably  Head is normocephalic, atraumatic.  No labored breathing.  Speech is clear and coherent with logical content.  Patient is alert and oriented at baseline.  Skin: 3 superficial puncture wounds noted to the hand. Two 1cm puncture wounds noted to the right knee  without surrounding erythema.   Assessment and Plan: 1. Dog bite, initial encounter  Rx for augmentin and antiinflammatories given. Advised that she will need to have a 48 hour wound check at [facility], ER or by her pcp.  Follow Up Instructions: I discussed the assessment and treatment plan with the patient. The patient was provided an opportunity to ask questions and all were answered. The patient agreed with the plan and demonstrated an understanding of the instructions.  A copy of instructions were sent to the patient via MyChart unless otherwise noted below.   The patient was advised to call back or seek an in-person evaluation if the symptoms worsen or if the condition fails to improve as anticipated.  Time:  I spent 14 minutes with the patient via telehealth technology discussing the above problems/concerns.    Elyza Whitt Manfred Shirts, PA-C

## 2022-10-26 ENCOUNTER — Ambulatory Visit: Payer: Self-pay

## 2022-10-26 ENCOUNTER — Encounter: Payer: Self-pay | Admitting: Family Medicine

## 2022-10-26 ENCOUNTER — Ambulatory Visit (INDEPENDENT_AMBULATORY_CARE_PROVIDER_SITE_OTHER): Payer: BC Managed Care – PPO | Admitting: Family Medicine

## 2022-10-26 VITALS — BP 122/70 | HR 94 | Temp 98.5°F | Resp 16 | Ht 67.0 in | Wt 195.8 lb

## 2022-10-26 DIAGNOSIS — M25561 Pain in right knee: Secondary | ICD-10-CM

## 2022-10-26 DIAGNOSIS — S81051D Open bite, right knee, subsequent encounter: Secondary | ICD-10-CM

## 2022-10-26 DIAGNOSIS — M25461 Effusion, right knee: Secondary | ICD-10-CM

## 2022-10-26 DIAGNOSIS — W540XXA Bitten by dog, initial encounter: Secondary | ICD-10-CM | POA: Diagnosis not present

## 2022-10-26 DIAGNOSIS — W540XXD Bitten by dog, subsequent encounter: Secondary | ICD-10-CM

## 2022-10-26 NOTE — Telephone Encounter (Signed)
    Chief Complaint: Dog bite last week, right knee. Has swelling and redness after completing antibiotic. Symptoms: Redness, swelling, pain Frequency: Last week Pertinent Negatives: Patient denies fever Disposition: [] ED /[] Urgent Care (no appt availability in office) / [] Appointment(In office/virtual)/ []  [facility]/ [] Home Care/ [] Refused Recommended Disposition /[] [facility]/ [x]  Follow-up with PCP Additional Notes: Pt. Asking to be worked in today. Practice currently closed for lunch.  Answer Assessment - Initial Assessment Questions 1. LOCATION: "Where is the swelling located?"  (e.g., left, right, both knees)     Right knee 2. ONSET: "When did the swelling start?" "Does it come and go, or is it there all the time?"     Last week 3. SWELLING: "How bad is the swelling?" Or, "How large is it?" (e.g., mild, moderate, severe; size of localized swelling)    - NONE: No joint swelling.   - LOCALIZED: Localized; small area of puffy or swollen skin (e.g., insect bite, skin irritation).   - MILD: Joint looks or feels mildly swollen or puffy.   - MODERATE: Swollen; interferes with normal activities (e.g., work or school); can't move joint normally (bend and straighten completely); may be limping.   - SEVERE: Very swollen; can't move swollen joint at all; limping a lot or unable to walk.     Mild 4. PAIN: "Is there any pain?" If Yes, ask: "How bad is it?" (Scale 1-10; or mild, moderate, severe)   - NONE (0): no pain.   - MILD (1-3): doesn't interfere with normal activities.    - MODERATE (4-7): interferes with normal activities (e.g., work or school) or awakens from sleep, limping.    - SEVERE (8-10): excruciating pain, unable to do any normal activities, unable to walk.      6 5. SETTING: "Has there been any recent work, exercise or other activity that involved that part of the body?"      Dog bite 6. AGGRAVATING FACTORS: "What makes the knee swelling worse?" (e.g.,  walking, climbing stairs, running)     Walking 7. ASSOCIATED SYMPTOMS: "Is there any pain or redness?"     Red 8. OTHER SYMPTOMS: "Do you have any other symptoms?" (e.g., chest pain, difficulty breathing, fever, calf pain)     Swelling 9. PREGNANCY: "Is there any chance you are pregnant?" "When was your last menstrual period?"     No  Protocols used: Knee Swelling-A-AH

## 2022-10-26 NOTE — Progress Notes (Signed)
Patient ID: Destiny Atkinson, female    DOB: 10-31-1976, 47 y.o.   MRN: [id number]  PCP: Steele Sizer, MD  Chief Complaint  Patient presents with   Animal Bite    Dog bitr about a week ago. On right knee pt states today woke up being red, swollen and with pain.    Subjective:   Destiny Atkinson is a 46 y.o. female, presents to clinic with CC of the following:  HPI  Here for continued redness and swelling after completing abx for dog bite that occurred last week:  Pt did virtual visit: History of Present Illness: Destiny Atkinson is a 46 y.o. who identifies as a female who was assigned female at birth, and is being seen today for dog bite.   Pt reports dog bite that occurred yesterday. She was bit by her landlord's dogs on her right hand and right knee. The dogs were a lab mix and husky mixed. The dogs vaccinations are UTD. Her Tdap is UTD.    States pain to her hand is mild and she just has pain mainly to the skin. Denies pain to the bones of her hand.    States wounds to the knee are deeper and located over the patella. Has some pain with rom of the knee.   She was prescribed augmentin BID x7 d and has completed whole course - last pill yesterday morning, it was feeling great - no pain at all - looked like her other knee except for the puncture wound/scab area/abrasions and there was no swelling  Then rapidly it has developed swelling, redness and severe pain to skin to the front of knee even with light touch     Patient Active Problem List   Diagnosis Date Noted   COPD with asthma 01/19/2022   Hypertension associated with type 2 diabetes mellitus ([place]) 10/26/2021   Moderate major depression ([place]) 10/26/2021   Vitamin D deficiency 12/28/2016   Mild major depression ([place]) 11/17/2015   GAD (generalized anxiety disorder) 05/06/2015   Asthma, moderate persistent 05/06/2015   Mobitz type I incomplete atrioventricular block 05/06/2015   B12 deficiency 05/06/2015   Essential  (primary) hypertension 05/06/2015   Irregular bleeding 05/06/2015   Migraine without aura and responsive to treatment 05/06/2015   BMI 31.0-31.9,adult 05/06/2015   Peripheral neuropathy 05/06/2015   Perennial allergic rhinitis 05/06/2015   Restless leg 05/06/2015   Tobacco abuse 05/06/2015      Current Outpatient Medications:    albuterol (VENTOLIN HFA) 108 (90 Base) MCG/ACT inhaler, USE 2 INHALATIONS EVERY 6 HOURS AS NEEDED FOR WHEEZING OR SHORTNESS OF BREATH, Disp: 17 g, Rfl: 0   ALPRAZolam (XANAX) 0.5 MG tablet, Take 1 tablet (0.5 mg total) by mouth daily as needed for anxiety., Disp: 21 tablet, Rfl: 0   B Complex Vitamins (VITAMIN B-COMPLEX) TABS, Take 1 tablet by mouth daily., Disp: , Rfl:    Budeson-Glycopyrrol-Formoterol (BREZTRI AEROSPHERE) 160-9-4.8 MCG/ACT AERO, Inhale 2 puffs into the lungs in the morning and at bedtime., Disp: 1 each, Rfl: 2   busPIRone (BUSPAR) 5 MG tablet, Take 1 tablet (5 mg total) by mouth daily as needed., Disp: 90 tablet, Rfl: 1   cariprazine (VRAYLAR) 1.5 MG capsule, Take 1 capsule (1.5 mg total) by mouth daily., Disp: 30 capsule, Rfl: 3   Cyanocobalamin (VITAMIN B12 PO), Take by mouth., Disp: , Rfl:    escitalopram (LEXAPRO) 20 MG tablet, Take 1 tablet (20 mg total) by mouth daily., Disp: 90 tablet, Rfl:  1   ipratropium-albuterol (DUONEB) 0.5-2.5 (3) MG/3ML SOLN, Take 3 mLs by nebulization 3 (three) times daily as needed (wheeze, coughing fits, chest tightness, SOB)., Disp: 180 mL, Rfl: 2   levocetirizine (XYZAL) 5 MG tablet, Take 1 tablet (5 mg total) by mouth daily., Disp: 90 tablet, Rfl: 1   levonorgestrel (LILETTA) 20.1 MCG/DAY IUD IUD, 1 each by Intrauterine route once., Disp: , Rfl:    loratadine (CLARITIN) 10 MG tablet, Take 10 mg by mouth daily., Disp: , Rfl:    losartan (COZAAR) 25 MG tablet, Take 1 tablet (25 mg total) by mouth daily., Disp: 90 tablet, Rfl: 1   nystatin (MYCOSTATIN) 100000 UNIT/ML suspension, Take 5 mLs (500,000 Units total) by  mouth 4 (four) times daily as needed (swish and swallow for oral thrush)., Disp: 60 mL, Rfl: 2   QUEtiapine (SEROQUEL) 25 MG tablet, Take by mouth., Disp: , Rfl:    rizatriptan (MAXALT-MLT) 10 MG disintegrating tablet, Take 1 tablet (10 mg total) by mouth as needed for migraine. May repeat in 2 hours if needed, Disp: 27 tablet, Rfl: 1   rOPINIRole (REQUIP) 1 MG tablet, Take 1 tablet (1 mg total) by mouth at bedtime., Disp: 90 tablet, Rfl: 1   Ubrogepant (UBRELVY) 100 MG TABS, Take 1 tablet by mouth daily as needed., Disp: 10 tablet, Rfl: 2   Vitamin D, Ergocalciferol, (DRISDOL) 1.25 MG (50000 UNIT) CAPS capsule, Take 1 capsule (50,000 Units total) by mouth every 7 (seven) days., Disp: 12 capsule, Rfl: 1   Allergies  Allergen Reactions   Dog Epithelium     Anaphylaxis with Rabbits.  Dogs, cats, birds cause swelling, extreme itching.   Dust Mite Extract    Pollen Extract    Soap    Tape    Tree Extract      Social History   Tobacco Use   Smoking status: Every Day    Packs/day: 0.25    Years: 24.00    Total pack years: 6.00    Types: Cigarettes, E-cigarettes    Start date: 06/29/1995   Smokeless tobacco: Never  Vaping Use   Vaping Use: Former   Start date: 02/14/2015   Quit date: 11/15/2018  Substance Use Topics   Alcohol use: Yes    Alcohol/week: 0.0 standard drinks of alcohol    Comment: rarely   Drug use: No      Chart Review Today: I personally reviewed active problem list, medication list, allergies, family history, social history, health maintenance, notes from last encounter, lab results, imaging with the patient/caregiver today.   Review of Systems  Constitutional: Negative.  Negative for fever.  HENT: Negative.    Eyes: Negative.   Respiratory: Negative.    Cardiovascular: Negative.   Gastrointestinal: Negative.   Endocrine: Negative.   Genitourinary: Negative.   Musculoskeletal:  Positive for joint swelling.  Skin: Negative.   Allergic/Immunologic:  Negative.   Neurological: Negative.   Hematological: Negative.   Psychiatric/Behavioral: Negative.    All other systems reviewed and are negative.      Objective:   Vitals:   10/26/22 1435  BP: 122/70  Pulse: 94  Resp: 16  Temp: 98.5 F (36.9 C)  TempSrc: Oral  SpO2: 95%  Weight: 195 lb 12.8 oz (88.8 kg)  Height: 5\' 7"  (1.702 m)    Body mass index is 30.67 kg/m.  Physical Exam Vitals and nursing note reviewed.  Constitutional:      Appearance: She is well-developed.  HENT:     Head: Normocephalic  and atraumatic.     Nose: Nose normal.  Eyes:     General:        Right eye: No discharge.        Left eye: No discharge.     Conjunctiva/sclera: Conjunctivae normal.  Neck:     Trachea: No tracheal deviation.  Cardiovascular:     Rate and Rhythm: Normal rate and regular rhythm.  Pulmonary:     Effort: Pulmonary effort is normal. No respiratory distress.     Breath sounds: No stridor.  Musculoskeletal:     Left knee: Swelling and erythema present. Decreased range of motion. Tenderness present.     Comments: 2 medial puncture wounds, 1 lateral, no drainage Severe ttp to skin below knee to edematous erythematous area anteriorly and just below patella Able to manipulate patella w/o pain No popliteal ttp   Skin:    General: Skin is warm and dry.     Findings: No rash.  Neurological:     Mental Status: She is alert.     Motor: No abnormal muscle tone.     Coordination: Coordination normal.  Psychiatric:        Behavior: Behavior normal.             Results for orders placed or performed in visit on 10/12/22  POCT HgB A1C  Result Value Ref Range   Hemoglobin A1C 6.5 (A) 4.0 - 5.6 %   HbA1c POC (<> result, manual entry)     HbA1c, POC (prediabetic range)     HbA1c, POC (controlled diabetic range)         Assessment & Plan:   Discussed with pt concern for deep space infection with the anatomical location, rapid progression and severe pain on exam. Pt  was also seen briefly by Dr. Ancil Boozer who advised the pt to go to the ED so she can be seen right away in case she needs to be taken to OR for washout  We discussed doing urgent care visit/walk in at [facility] but they may also have to send her to the OR Likely needs some IV abx since she already took and completed augmentin    ICD-10-CM   1. Acute pain of right knee  M25.561     2. Dog bite of right knee, subsequent encounter  S81.051D    W54.0XXD    acute worsening after completing abx, pt needs higher level care ortho vs ED    3. Pain and swelling of right knee  M25.561    M25.461    w/o imaging difficult to say if cellulitis, abscess, or deep space infection which is possible with how the dog bite occured        Delsa Grana, PA-C 10/26/22 3:03 PM

## 2022-10-31 ENCOUNTER — Telehealth: Payer: Self-pay | Admitting: Family Medicine

## 2022-10-31 DIAGNOSIS — W540XXA Bitten by dog, initial encounter: Secondary | ICD-10-CM | POA: Diagnosis not present

## 2022-10-31 DIAGNOSIS — M25561 Pain in right knee: Secondary | ICD-10-CM | POA: Diagnosis not present

## 2022-10-31 NOTE — Telephone Encounter (Signed)
PA submitted.

## 2022-10-31 NOTE — Telephone Encounter (Signed)
Bree from Cover [employer]. Stated PA for the medication cariprazine (VRAYLAR) 1.5 MG capsule was submitted to [employer], which will go directly to pt plan, which [employer] will handle.  A new request was started Key: [id number] to access online.  Please advise.

## 2022-11-07 ENCOUNTER — Other Ambulatory Visit: Payer: Self-pay | Admitting: Family Medicine

## 2022-11-08 ENCOUNTER — Other Ambulatory Visit: Payer: Self-pay

## 2022-11-10 ENCOUNTER — Encounter: Payer: Self-pay | Admitting: Family Medicine

## 2022-11-10 DIAGNOSIS — S81051D Open bite, right knee, subsequent encounter: Secondary | ICD-10-CM | POA: Diagnosis not present

## 2022-11-10 DIAGNOSIS — M25561 Pain in right knee: Secondary | ICD-10-CM | POA: Diagnosis not present

## 2022-11-11 ENCOUNTER — Other Ambulatory Visit: Payer: Self-pay | Admitting: Family Medicine

## 2022-11-11 MED ORDER — QUETIAPINE FUMARATE 25 MG PO TABS: 25.0000 mg | ORAL_TABLET | Freq: Every day | ORAL | 0 refills | Status: DC

## 2022-11-18 ENCOUNTER — Ambulatory Visit: Payer: Self-pay

## 2022-11-18 ENCOUNTER — Encounter: Payer: Self-pay | Admitting: Family Medicine

## 2022-11-18 ENCOUNTER — Ambulatory Visit (INDEPENDENT_AMBULATORY_CARE_PROVIDER_SITE_OTHER): Payer: BC Managed Care – PPO | Admitting: Family Medicine

## 2022-11-18 VITALS — BP 118/76 | HR 94 | Resp 16 | Ht 67.0 in | Wt 195.0 lb

## 2022-11-18 DIAGNOSIS — J029 Acute pharyngitis, unspecified: Secondary | ICD-10-CM

## 2022-11-18 DIAGNOSIS — R09A2 Foreign body sensation, throat: Secondary | ICD-10-CM

## 2022-11-18 MED ORDER — PREDNISOLONE 15 MG/5ML PO SOLN: ORAL | 0 refills | Status: DC

## 2022-11-18 MED ORDER — LIDOCAINE VISCOUS HCL 2 % MT SOLN: 5.0000 mL | OROMUCOSAL | 0 refills | Status: DC | PRN

## 2022-11-18 NOTE — Telephone Encounter (Signed)
  Chief Complaint: sore throat Symptoms: right side of throat with severe pain,  Frequency: Tuesday Pertinent Negatives: Patient denies fever, rash, difficulty breathing, headache or rash Disposition: [] ED /[] Urgent Care (no appt availability in office) / [x] Appointment(In office/virtual)/ []  [place] [facility]/ [] Home Care/ [] Refused Recommended Disposition /[] [place] [facility]/ []  Follow-up with PCP Additional Notes: appt today Reason for Disposition  SEVERE (e.g., excruciating) throat pain  Answer Assessment - Initial Assessment Questions 1. ONSET: "When did the throat start hurting?" (Hours or days ago)      11/15/22 2. SEVERITY: "How bad is the sore throat?" (Scale 1-10; mild, moderate or severe)   - MILD (1-3):  Doesn't interfere with eating or normal activities.   - MODERATE (4-7): Interferes with eating some solids and normal activities.   - SEVERE (8-10):  Excruciating pain, interferes with most normal activities.   - SEVERE WITH DYSPHAGIA (10): Can't swallow liquids, drooling.     Severe to right side of throat 3. STREP EXPOSURE: "Has there been any exposure to strep within the past week?" If Yes, ask: "What type of contact occurred?"      no 4.  VIRAL SYMPTOMS: "Are there any symptoms of a cold, such as a runny nose, cough, hoarse voice or red eyes?"      Sore throat negative for covid  5. FEVER: "Do you have a fever?" If Yes, ask: "What is your temperature, how was it measured, and when did it start?"     no 6. PUS ON THE TONSILS: "Is there pus on the tonsils in the back of your throat?"     no 7. OTHER SYMPTOMS: "Do you have any other symptoms?" (e.g., difficulty breathing, headache, rash)     no 8. PREGNANCY: "Is there any chance you are pregnant?" "When was your last menstrual period?"     N/a  Protocols used: Sore Throat-A-AH

## 2022-11-18 NOTE — Progress Notes (Signed)
Patient ID: Destiny Atkinson, female    DOB: 10/08/1976, 46 y.o.   MRN: [id number]  PCP: Alba Cory, MD  Chief Complaint  Patient presents with   Sore Throat    Ate popcorn 11/15/22- piece got stuck on back of throat causing irritation/ burning feeling.     Subjective:   Destiny Atkinson is a 46 y.o. female, presents to clinic with CC of the following:  Sore Throat     Piece of popcorn in throat irritating for 3+ days She's tried gargling salt water, tea Cannot it to move Pain/scratchy burning worsening to throat Using numbing spray for that  Patient Active Problem List   Diagnosis Date Noted   COPD with asthma 01/19/2022   Hypertension associated with type 2 diabetes mellitus ([facility]) 10/26/2021   Moderate major depression ([facility]) 10/26/2021   Vitamin D deficiency 12/28/2016   Mild major depression ([facility]) 11/17/2015   GAD (generalized anxiety disorder) 05/06/2015   Asthma, moderate persistent 05/06/2015   Mobitz type I incomplete atrioventricular block 05/06/2015   B12 deficiency 05/06/2015   Essential (primary) hypertension 05/06/2015   Irregular bleeding 05/06/2015   Migraine without aura and responsive to treatment 05/06/2015   BMI 31.0-31.9,adult 05/06/2015   Peripheral neuropathy 05/06/2015   Perennial allergic rhinitis 05/06/2015   Restless leg 05/06/2015   Tobacco abuse 05/06/2015      Current Outpatient Medications:    albuterol (VENTOLIN HFA) 108 (90 Base) MCG/ACT inhaler, USE 2 INHALATIONS EVERY 6 HOURS AS NEEDED FOR WHEEZING OR SHORTNESS OF BREATH, Disp: 17 g, Rfl: 0   ALPRAZolam (XANAX) 0.5 MG tablet, Take 1 tablet (0.5 mg total) by mouth daily as needed for anxiety., Disp: 21 tablet, Rfl: 0   B Complex Vitamins (VITAMIN B-COMPLEX) TABS, Take 1 tablet by mouth daily., Disp: , Rfl:    Budeson-Glycopyrrol-Formoterol (BREZTRI AEROSPHERE) 160-9-4.8 MCG/ACT AERO, Inhale 2 puffs into the lungs in the morning and at bedtime., Disp: 1 each, Rfl: 2   busPIRone  (BUSPAR) 5 MG tablet, Take 1 tablet (5 mg total) by mouth daily as needed., Disp: 90 tablet, Rfl: 1   Cyanocobalamin (VITAMIN B12 PO), Take by mouth., Disp: , Rfl:    escitalopram (LEXAPRO) 20 MG tablet, Take 1 tablet (20 mg total) by mouth daily., Disp: 90 tablet, Rfl: 1   ipratropium-albuterol (DUONEB) 0.5-2.5 (3) MG/3ML SOLN, Take 3 mLs by nebulization 3 (three) times daily as needed (wheeze, coughing fits, chest tightness, SOB)., Disp: 180 mL, Rfl: 2   levocetirizine (XYZAL) 5 MG tablet, Take 1 tablet (5 mg total) by mouth daily., Disp: 90 tablet, Rfl: 1   levonorgestrel (LILETTA) 20.1 MCG/DAY IUD IUD, 1 each by Intrauterine route once., Disp: , Rfl:    loratadine (CLARITIN) 10 MG tablet, Take 10 mg by mouth daily., Disp: , Rfl:    losartan (COZAAR) 25 MG tablet, Take 1 tablet (25 mg total) by mouth daily., Disp: 90 tablet, Rfl: 1   nystatin (MYCOSTATIN) 100000 UNIT/ML suspension, Take 5 mLs (500,000 Units total) by mouth 4 (four) times daily as needed (swish and swallow for oral thrush)., Disp: 60 mL, Rfl: 2   QUEtiapine (SEROQUEL) 25 MG tablet, Take 1 tablet (25 mg total) by mouth at bedtime., Disp: 90 tablet, Rfl: 0   rizatriptan (MAXALT-MLT) 10 MG disintegrating tablet, Take 1 tablet (10 mg total) by mouth as needed for migraine. May repeat in 2 hours if needed, Disp: 27 tablet, Rfl: 1   rOPINIRole (REQUIP) 1 MG tablet, Take 1 tablet (1  mg total) by mouth at bedtime., Disp: 90 tablet, Rfl: 1   Ubrogepant (UBRELVY) 100 MG TABS, Take 1 tablet by mouth daily as needed., Disp: 10 tablet, Rfl: 2   Vitamin D, Ergocalciferol, (DRISDOL) 1.25 MG (50000 UNIT) CAPS capsule, Take 1 capsule (50,000 Units total) by mouth every 7 (seven) days., Disp: 12 capsule, Rfl: 1   Allergies  Allergen Reactions   Dog Epithelium     Anaphylaxis with Rabbits.  Dogs, cats, birds cause swelling, extreme itching.   Dust Mite Extract    Pollen Extract    Soap    Tape    Tree Extract      Social History    Tobacco Use   Smoking status: Every Day    Packs/day: 0.25    Years: 24.00    Total pack years: 6.00    Types: Cigarettes, E-cigarettes    Start date: 06/29/1995   Smokeless tobacco: Never  Vaping Use   Vaping Use: Former   Start date: 02/14/2015   Quit date: 11/15/2018  Substance Use Topics   Alcohol use: Yes    Alcohol/week: 0.0 standard drinks of alcohol    Comment: rarely   Drug use: No      Chart Review Today: I personally reviewed active problem list, medication list, allergies, family history, social history, health maintenance, notes from last encounter, lab results, imaging with the patient/caregiver today.   Review of Systems  Constitutional: Negative.   HENT: Negative.    Eyes: Negative.   Respiratory: Negative.    Cardiovascular: Negative.   Gastrointestinal: Negative.   Endocrine: Negative.   Genitourinary: Negative.   Musculoskeletal: Negative.   Skin: Negative.   Allergic/Immunologic: Negative.   Neurological: Negative.   Hematological: Negative.   Psychiatric/Behavioral: Negative.    All other systems reviewed and are negative.      Objective:   Vitals:   11/18/22 1335  BP: 118/76  Pulse: 94  Resp: 16  SpO2: 98%  Weight: 195 lb (88.5 kg)  Height: 5\' 7"  (1.702 m)    Body mass index is 30.54 kg/m.  Physical Exam Vitals and nursing note reviewed.  Constitutional:      General: She is not in acute distress.    Appearance: She is well-developed. She is not toxic-appearing or diaphoretic.  HENT:     Head: Normocephalic and atraumatic.     Nose: Nose normal.     Mouth/Throat:     Pharynx: Uvula midline. Pharyngeal swelling (mild) and posterior oropharyngeal erythema present. No oropharyngeal exudate or uvula swelling.     Tonsils: 0 on the right. 0 on the left.     Comments: Corn kernel piece visible by right tonsilar pillar Removed with sterile cotton swab using light and tongue depressor  Eyes:     General:        Right eye: No  discharge.        Left eye: No discharge.     Conjunctiva/sclera: Conjunctivae normal.  Neck:     Trachea: No tracheal deviation.  Cardiovascular:     Rate and Rhythm: Normal rate and regular rhythm.  Pulmonary:     Effort: Pulmonary effort is normal. No respiratory distress.     Breath sounds: No stridor.  Musculoskeletal:        General: Normal range of motion.  Skin:    General: Skin is warm and dry.     Findings: No rash.  Neurological:     Mental Status: She is alert.  Motor: No abnormal muscle tone.     Coordination: Coordination normal.  Psychiatric:        Behavior: Behavior normal.      Results for orders placed or performed in visit on 10/12/22  POCT HgB A1C  Result Value Ref Range   Hemoglobin A1C 6.5 (A) 4.0 - 5.6 %   HbA1c POC (<> result, manual entry)     HbA1c, POC (prediabetic range)     HbA1c, POC (controlled diabetic range)         Assessment & Plan:     ICD-10-CM   1. Foreign body sensation in throat  R09.A2    popcorn stuck a few days ago - irritated, removed today in office    2. Pharyngitis, unspecified etiology  J02.9 lidocaine (XYLOCAINE) 2 % solution    prednisoLONE (PRELONE) 15 MG/5ML SOLN        Pt advised to use maalox and add viscous lidocaine and steroid to liquid to gargle with for swelling/pain for the next few days  Danelle Berry, PA-C 11/18/22 1:49 PM

## 2022-12-06 ENCOUNTER — Other Ambulatory Visit: Payer: Self-pay | Admitting: Family Medicine

## 2022-12-06 DIAGNOSIS — G2581 Restless legs syndrome: Secondary | ICD-10-CM

## 2022-12-19 ENCOUNTER — Other Ambulatory Visit: Payer: Self-pay | Admitting: Family Medicine

## 2022-12-26 IMAGING — CT CT ABD-PELV W/ CM
2 of 4 series · 16 of 46 positions shown, 18 images · IV contrast (agent unspecified)
Comparison: Images only from outside pelvic ultrasound 06/14/2018

CLINICAL DATA: Right lower quadrant abdominal pain for 2 weeks with
nausea, vomiting and diarrhea. No previous relevant surgery.

EXAM:
CT ABDOMEN AND PELVIS WITH CONTRAST
TECHNIQUE: Multidetector CT imaging of the abdomen and pelvis was performed
using the standard protocol following bolus administration of
intravenous contrast.

[Series 2: abd pelvis 5.00 · axial · 0.88mm/px · z∈[-1570,-1130]mm · 13 of 96 slices shown, 15 images]
[im 4/96  soft-tissue]
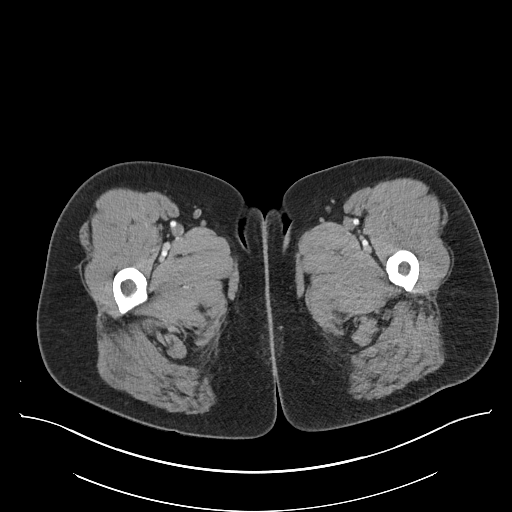
[im 4/96  bone]
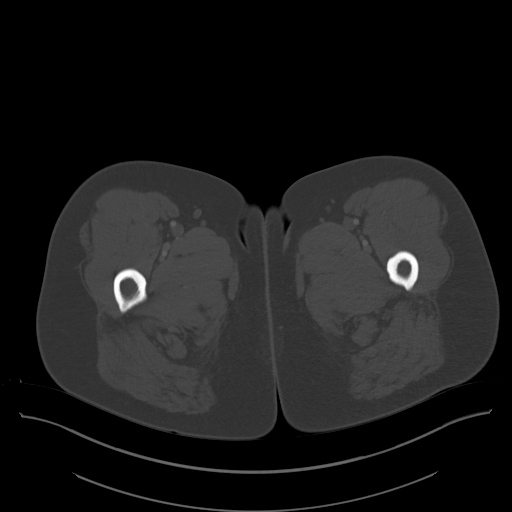
[im 12/96  soft-tissue]
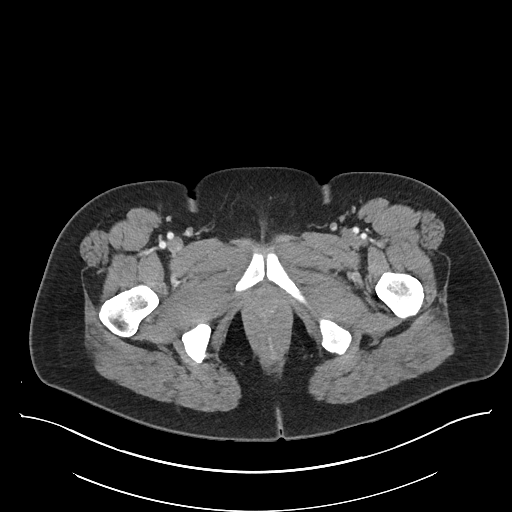
[im 20/96  soft-tissue]
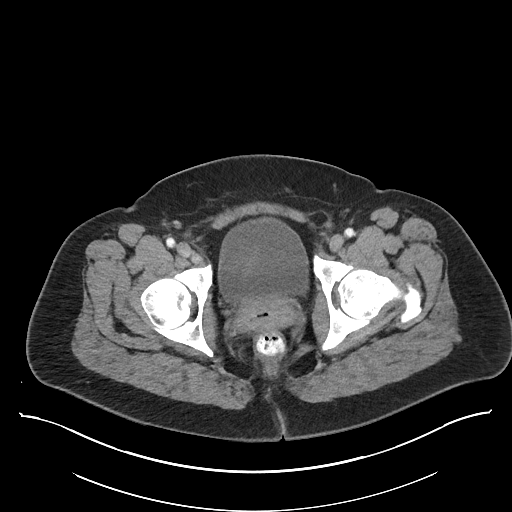
[im 28/96  soft-tissue]
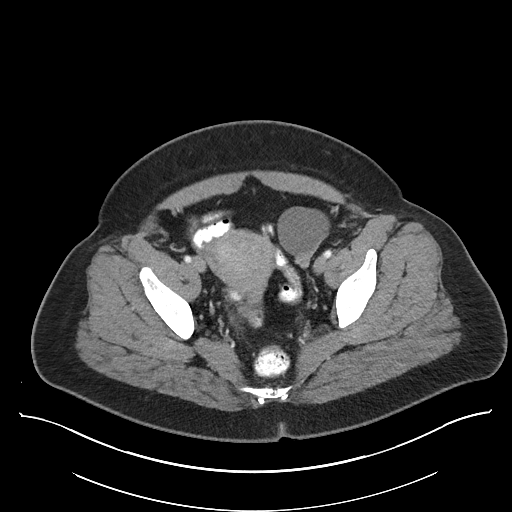
[im 32/96  soft-tissue]
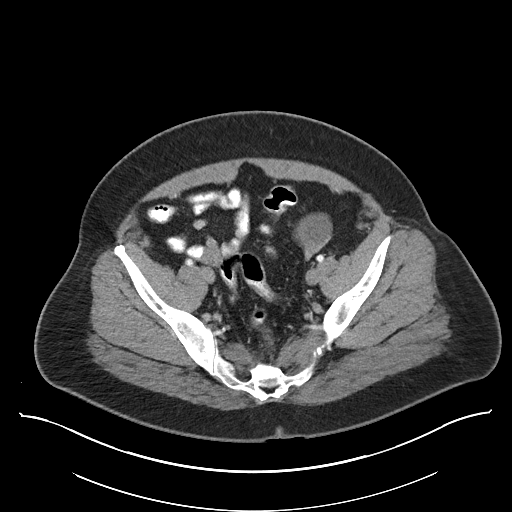
[im 40/96  soft-tissue]
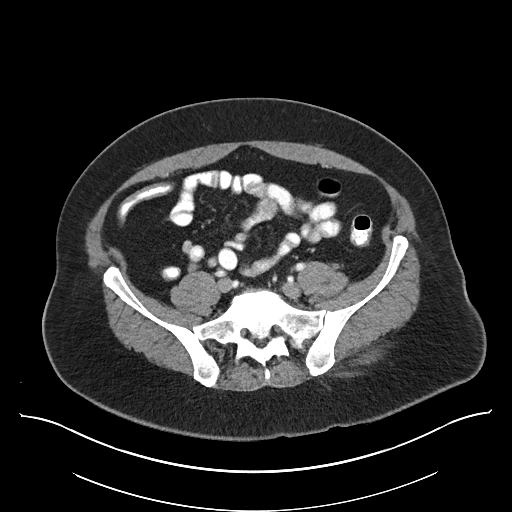
[im 48/96  soft-tissue]
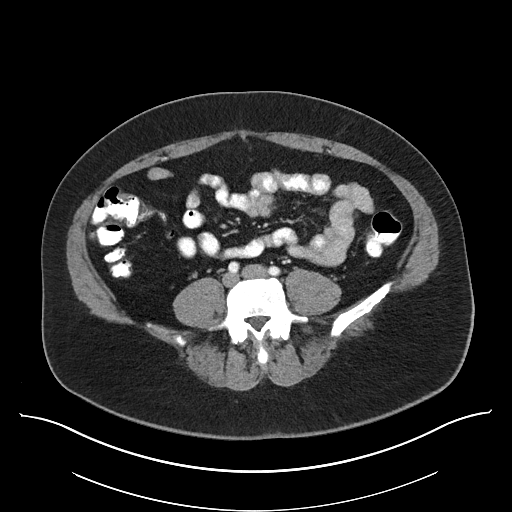
[im 56/96  soft-tissue]
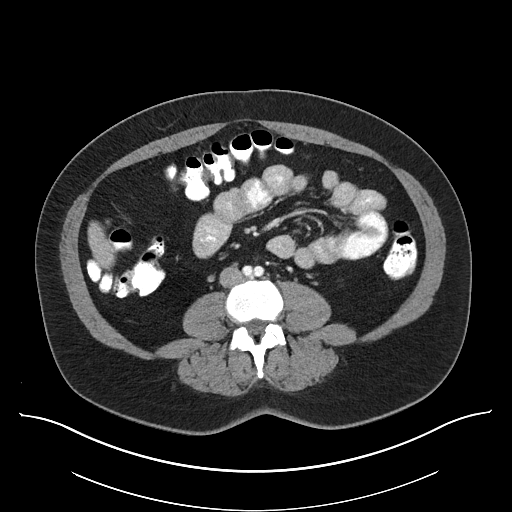
[im 64/96  soft-tissue]
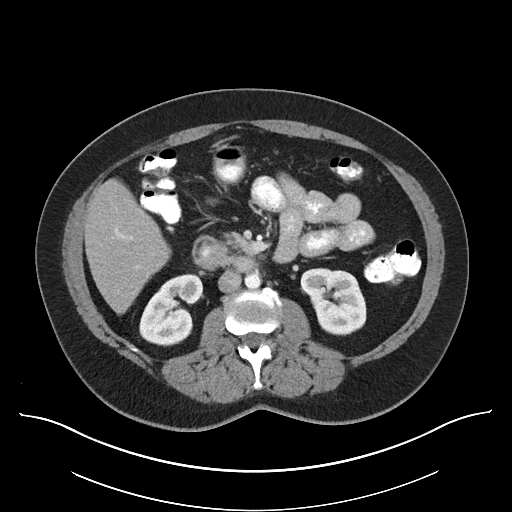
[im 64/96  bone]
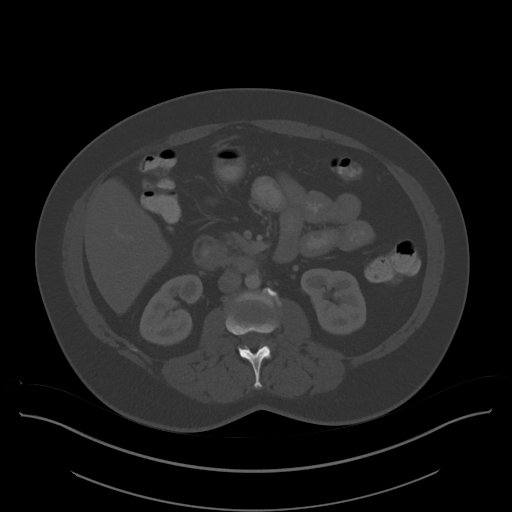
[im 68/96  soft-tissue]
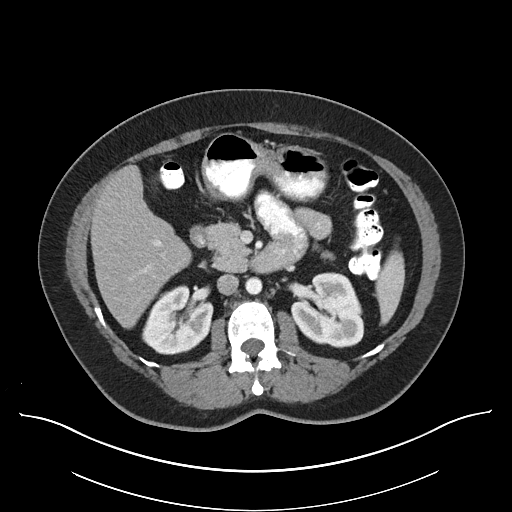
[im 76/96  soft-tissue]
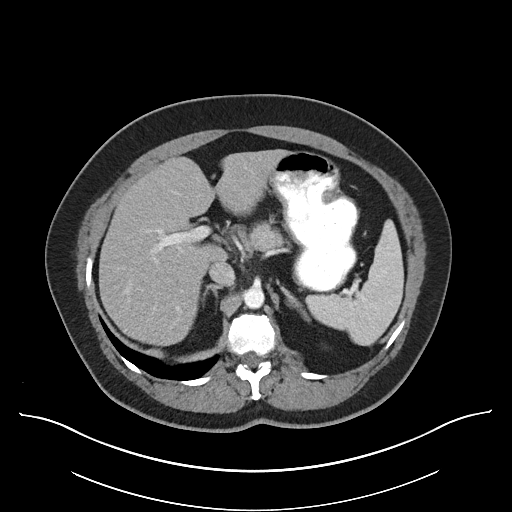
[im 84/96  soft-tissue]
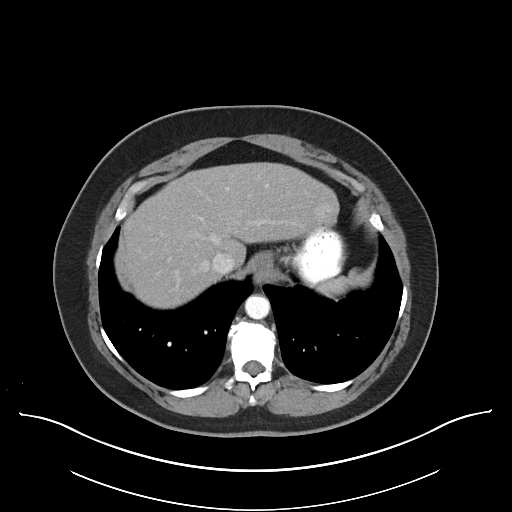
[im 92/96  soft-tissue]
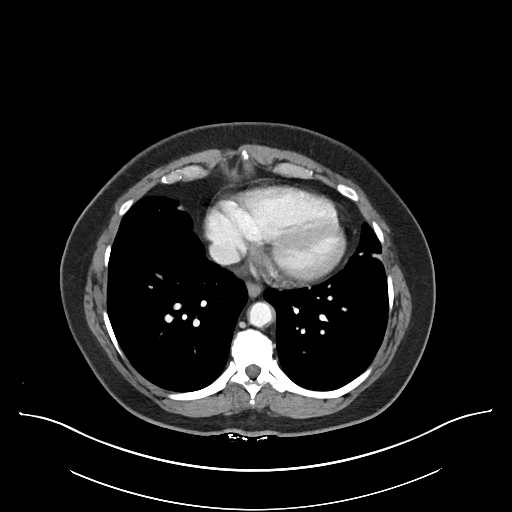

[Series 4: coronals abd pelvis 2.00 cor · coronal · 0.85mm/px · 3 of 158 slices shown]
[im 53/158  soft-tissue]
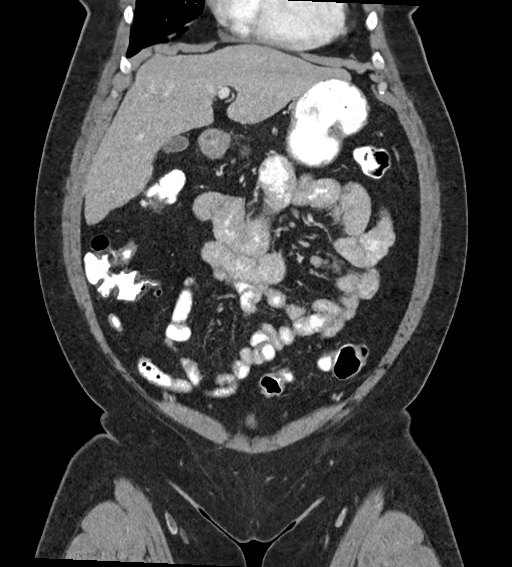
[im 70/158  soft-tissue]
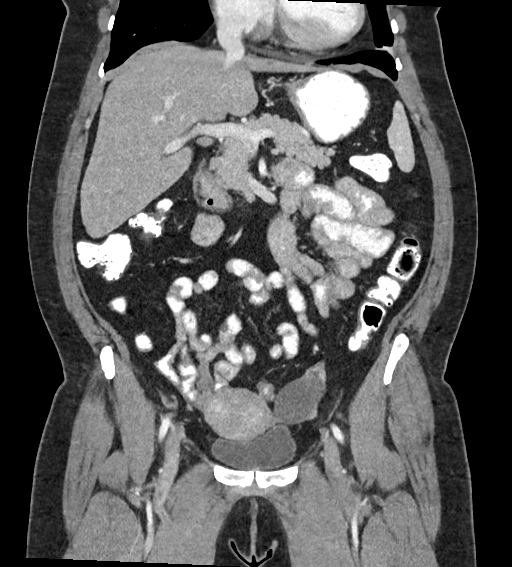
[im 88/158  soft-tissue]
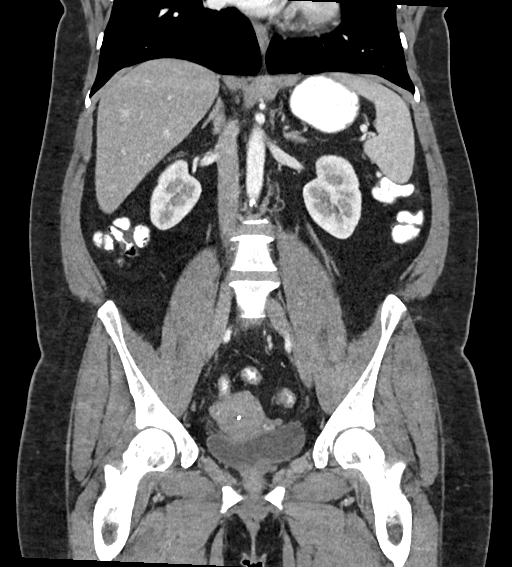

[16 of 46 positions shown; findings below may reference images not displayed]

RADIATION DOSE REDUCTION: This exam was performed according to the
departmental dose-optimization program which includes automated
exposure control, adjustment of the mA and/or kV according to
patient size and/or use of iterative reconstruction technique.

CONTRAST:  100mL OMNIPAQUE IOHEXOL 300 MG/ML  SOLN
FINDINGS: Lower chest: Clear lung bases. No significant pleural or pericardial
effusion.

Hepatobiliary: The liver is normal in density without suspicious
focal abnormality. No evidence of gallstones, gallbladder wall
thickening or biliary dilatation.

Pancreas: Unremarkable. No pancreatic ductal dilatation or
surrounding inflammatory changes.

Spleen: Normal in size without focal abnormality.

Adrenals/Urinary Tract: Both adrenal glands appear normal. The
kidneys appear normal without evidence of urinary tract calculus,
suspicious lesion or hydronephrosis. The bladder appears
unremarkable for its degree of distention.

Stomach/Bowel: Enteric contrast was administered and has passed into
the distal colon. The stomach appears unremarkable for its degree of
distension. No evidence of bowel wall thickening, distention or
surrounding inflammatory change. The appendix appears normal.

Vascular/Lymphatic: There are no enlarged abdominal or pelvic lymph
nodes. Aortic and branch vessel atherosclerosis without aneurysm or
large vessel occlusion.

Reproductive: Intrauterine device in place. Cystic appearing left
adnexal lesion measuring 5.2 x 3.9 cm and demonstrating a density of
24 HU. This measures up to 6.0 cm in greatest dimension on coronal
image 64/4. No suspicious right adnexal findings or surrounding
inflammatory changes.

Other: No evidence of abdominal wall mass or hernia. No ascites.

Musculoskeletal: No acute or significant osseous findings.
IMPRESSION: 1. No acute findings or explanation for the patient's symptoms. The
appendix appears normal.
2. 6.0 cm left adnexal simple-appearing cyst. Recommend follow-up
pelvic US in 3-6 months.
Reference: JACR [DATE]):[id number] Note: This recommendation
does not apply to premenarchal patients and to those with increased
risk (genetic, family history, elevated tumor markers or other
high-risk factors) of ovarian cancer. Reference: JACR [DATE].  Aortic Atherosclerosis ([id number]).

## 2022-12-30 ENCOUNTER — Encounter: Payer: Self-pay | Admitting: Family Medicine

## 2023-01-30 ENCOUNTER — Other Ambulatory Visit: Payer: Self-pay | Admitting: Family Medicine

## 2023-01-30 DIAGNOSIS — E559 Vitamin D deficiency, unspecified: Secondary | ICD-10-CM

## 2023-01-30 NOTE — Progress Notes (Unsigned)
Name: Destiny Atkinson   MRN: [id number]    DOB: 01/16/1976   Date:01/31/2023       Progress Note  Subjective  Chief Complaint  Follow Up  HPI  Migraine : Migraine is not associated nausea or vomiting, but is associated with photophobia. Migraine is described as a sharp pain over left eye and radiates to left temporal area and sometimes nuchal area. Takes Maxalt first and sometimes Roselyn Meier but no episodes in a while . Unchanged    HTN: she has been taking losartan low dose and denies dizziness, bp towards low range of normal, discussed taking half dose and monitor    GAD/Major Depression: she left [place] and started a new job at [employer] in [place] back in May 2017, she left [employer] March 2018 when she walked away from her job,  and was unemployed until 06/2017. 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 She has been taking lexapro daily, buspar and occasionally alprazolam, she is due for refill of 18 tablets to last 3 months on Nov 30 th, however her regular medications are not going to be filled until Dec 14th, we will give her 21 pills to last until mid March and all her medications can be in sink.  We gave her seroquel last visit to help her sleep and it has helped but depression and anxiety still not controlled so we will stop seroquel and tried Vraylar but not covered by insurance. 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 She has associated HTN, dyslipidemia and obesity. She tried Rybelsus, took it for 2 months but unable to tolerate nausea, we gave her Wilder Glade but stopped  due to yeast infection. Currently on diet only. A1C still at goal at 6.5 % but trending up It was 6.7 % back in 2022 , today is up to 6.8 %. Discussed Metformin but she would like to hold off for now     RLS/Peripheral Neuropathy: She has a  need to move her legs when sitting down, also states that at night her legs moves constantly. Numbness had been intermittent on hands and feet. She was on Requip but stopped working after 6 months, she has failed gabapentin, she was on Mirapax , but doing well on Requip    AR: she his allergic to cats, but has 3 cats at home, also allergic to dogs and has one of them. She tries to keep them out of her bedroom. She also has environmental allergies and it is not good at this time    Asthma Moderate / Chronic bronchitis : she still smokes, about 6 cigarettes per day and vapes the other times. She is back on Brezti and is doing better, still wheezes at night  She has an occasional cough but no SOB  Dyslipidemia: reviewed last labs with patient again and reminded her LDL goal is below 70 , last LDL high again. She still refuses statin therapy   Vitamin D deficiency: she has been compliant with vitamin D weekly   B12 deficiency: she will resume supplements  Patient Active Problem List   Diagnosis Date Noted   Hypertension associated with type 2 diabetes mellitus ([place]) 10/26/2021   Moderate major depression ([place]) 10/26/2021   Vitamin D deficiency 12/28/2016   Mild  major depression ([place]) 11/17/2015   GAD (generalized anxiety disorder) 05/06/2015   Moderate persistent asthma with allergic rhinitis without complication [id number]   Mobitz type I incomplete atrioventricular block 05/06/2015   B12 deficiency 05/06/2015   Essential (primary) hypertension 05/06/2015   Irregular bleeding 05/06/2015   Migraine without aura and responsive to treatment 05/06/2015   BMI 31.0-31.9,adult 05/06/2015   Peripheral neuropathy 05/06/2015   Perennial allergic rhinitis 05/06/2015   Restless leg 05/06/2015   Tobacco abuse 05/06/2015    Past Surgical History:  Procedure Laterality Date   COLONOSCOPY     in 8th grade, not as adult   ESOPHAGOGASTRODUODENOSCOPY     age 68   NASAL SINUS SURGERY  01/21/2010    septoplasty/ sinus surgery   WISDOM TOOTH EXTRACTION  age 88    Family History  Problem Relation Age of Onset   COPD Mother    Fibromyalgia Mother    Hypertension Mother    Stroke Mother    Asthma Mother    Arthritis Mother    Depression Mother    Heart disease Mother    Crohn's disease Mother    Diabetes Father    Diabetes type II Daughter    Diabetes type II Daughter    Kidney Stones Daughter    Bipolar disorder Maternal Grandmother    Stomach cancer Paternal Grandmother 26   Epilepsy Brother    Ovarian cancer Other 43   Ovarian cancer Other 73   Breast cancer Neg Hx     Social History   Tobacco Use   Smoking status: Every Day    Packs/day: 0.25    Years: 24.00    Total pack years: 6.00    Types: Cigarettes, E-cigarettes    Start date: 06/29/1995   Smokeless tobacco: Never  Substance Use Topics   Alcohol use: Yes    Alcohol/week: 0.0 standard drinks of alcohol    Comment: rarely     Current Outpatient Medications:    albuterol (VENTOLIN HFA) 108 (90 Base) MCG/ACT inhaler, USE 2 INHALATIONS EVERY 6 HOURS AS NEEDED FOR WHEEZING OR SHORTNESS OF BREATH, Disp: 17 g, Rfl: 0   B Complex Vitamins (VITAMIN B-COMPLEX) TABS, Take 1 tablet by mouth daily., Disp: , Rfl:    busPIRone (BUSPAR) 5 MG tablet, Take 1 tablet (5 mg total) by mouth daily as needed., Disp: 90 tablet, Rfl: 1   Cyanocobalamin (VITAMIN B12 PO), Take by mouth., Disp: , Rfl:    escitalopram (LEXAPRO) 20 MG tablet, Take 1 tablet (20 mg total) by mouth daily., Disp: 90 tablet, Rfl: 1   ipratropium-albuterol (DUONEB) 0.5-2.5 (3) MG/3ML SOLN, Take 3 mLs by nebulization 3 (three) times daily as needed (wheeze, coughing fits, chest tightness, SOB)., Disp: 180 mL, Rfl: 2   levocetirizine (XYZAL) 5 MG tablet, Take 1 tablet (5 mg total) by mouth daily., Disp: 90 tablet, Rfl: 1   levonorgestrel (LILETTA) 20.1 MCG/DAY IUD IUD, 1 each by Intrauterine route once., Disp: , Rfl:    lidocaine (XYLOCAINE) 2 % solution, Use  as directed 5-10 mLs in the mouth or throat every 4 (four) hours as needed for mouth pain., Disp: 100 mL, Rfl: 0   loratadine (CLARITIN) 10 MG tablet, Take 10 mg by mouth daily., Disp: , Rfl:    losartan (COZAAR) 25 MG tablet, Take 1 tablet (25 mg total) by mouth daily., Disp: 90 tablet, Rfl: 1   nystatin (MYCOSTATIN) 100000 UNIT/ML suspension, Take 5 mLs (500,000 Units total) by mouth 4 (four) times daily as  needed (swish and swallow for oral thrush)., Disp: 60 mL, Rfl: 2   rizatriptan (MAXALT-MLT) 10 MG disintegrating tablet, Take 1 tablet (10 mg total) by mouth as needed for migraine. May repeat in 2 hours if needed, Disp: 27 tablet, Rfl: 1   rOPINIRole (REQUIP) 1 MG tablet, Take 1 tablet (1 mg total) by mouth at bedtime., Disp: 90 tablet, Rfl: 1   Ubrogepant (UBRELVY) 100 MG TABS, Take 1 tablet by mouth daily as needed., Disp: 10 tablet, Rfl: 2   ALPRAZolam (XANAX) 0.5 MG tablet, Take 1 tablet (0.5 mg total) by mouth daily as needed for anxiety., Disp: 20 tablet, Rfl: 0   Budeson-Glycopyrrol-Formoterol (BREZTRI AEROSPHERE) 160-9-4.8 MCG/ACT AERO, Inhale 2 puffs into the lungs in the morning and at bedtime., Disp: 3 each, Rfl: 1   QUEtiapine (SEROQUEL) 25 MG tablet, Take 1 tablet (25 mg total) by mouth at bedtime., Disp: 90 tablet, Rfl: 1   Vitamin D, Ergocalciferol, (DRISDOL) 1.25 MG (50000 UNIT) CAPS capsule, Take 1 capsule (50,000 Units total) by mouth every 7 (seven) days., Disp: 12 capsule, Rfl: 1  Allergies  Allergen Reactions   Dog Epithelium     Anaphylaxis with Rabbits.  Dogs, cats, birds cause swelling, extreme itching.   Dust Mite Extract    Pollen Extract    Soap    Tape    Tree Extract     I personally reviewed active problem list, medication list, allergies, family history, social history, health maintenance with the patient/caregiver today.   ROS  Ten systems reviewed and is negative except as mentioned in HPI   Objective  Vitals:   01/31/23 0813  BP: 116/68   Pulse: 94  Resp: 14  Temp: 98.1 F (36.7 C)  TempSrc: Oral  SpO2: 97%  Weight: 196 lb (88.9 kg)  Height: '5\' 7"'$  (1.702 m)    Body mass index is 30.7 kg/m.  Physical Exam  Constitutional: Patient appears well-developed and well-nourished. Obese  No distress.  HEENT: head atraumatic, normocephalic, pupils equal and reactive to light, neck supple Cardiovascular: Normal rate, regular rhythm and normal heart sounds.  No murmur heard. No BLE edema. Pulmonary/Chest: Effort normal and breath sounds normal. No respiratory distress. Abdominal: Soft.  There is no tenderness. Psychiatric: Patient has a normal mood and affect. behavior is normal. Judgment and thought content normal.   Recent Results (from the past 2160 hour(s))  POCT HgB A1C     Status: Abnormal   Collection Time: 01/31/23  8:18 AM  Result Value Ref Range   Hemoglobin A1C 6.8 (A) 4.0 - 5.6 %   HbA1c POC (<> result, manual entry)     HbA1c, POC (prediabetic range)     HbA1c, POC (controlled diabetic range)      Diabetic Foot Exam: Diabetic Foot Exam - Simple   Simple Foot Form Visual Inspection No deformities, no ulcerations, no other skin breakdown bilaterally: Yes Sensation Testing Intact to touch and monofilament testing bilaterally: Yes Pulse Check Posterior Tibialis and Dorsalis pulse intact bilaterally: Yes Comments      PHQ2/9:    01/31/2023    8:16 AM 11/18/2022    1:34 PM 10/26/2022    2:35 PM 10/12/2022   10:33 AM 09/06/2022   10:38 AM  Depression screen PHQ 2/9  Decreased Interest 3 0 0 2 0  Down, Depressed, Hopeless 3 0 0 2 0  PHQ - 2 Score 6 0 0 4 0  Altered sleeping 2 0 0 0 0  Tired, decreased energy 2  0 0 2 0  Change in appetite 2 0 0 2 0  Feeling bad or failure about yourself  1 0 0 0 0  Trouble concentrating 2 0 0 0 0  Moving slowly or fidgety/restless 0 0 0 0 0  Suicidal thoughts 0 0 0 0 0  PHQ-9 Score 15 0 0 8 0  Difficult doing work/chores Very difficult  Not difficult at all  Somewhat difficult Not difficult at all    phq 9 is positive   Fall Risk:    01/31/2023    8:12 AM 11/18/2022    1:34 PM 10/26/2022    2:34 PM 10/12/2022   10:32 AM 09/06/2022   10:38 AM  Fall Risk   Falls in the past year? 0 '1 1 1 1  '$ Number falls in past yr:  0 0 0 0  Injury with Fall?  0 0 0 0  Risk for fall due to : No Fall Risks No Fall Risks Impaired balance/gait Impaired balance/gait Impaired balance/gait;Impaired mobility  Follow up Falls prevention discussed Falls prevention discussed Falls prevention discussed;Education provided;Falls evaluation completed Falls prevention discussed;Education provided;Falls evaluation completed Education provided;Falls prevention discussed      Functional Status Survey: Is the patient deaf or have difficulty hearing?: No Does the patient have difficulty seeing, even when wearing glasses/contacts?: No Does the patient have difficulty concentrating, remembering, or making decisions?: Yes Does the patient have difficulty walking or climbing stairs?: No Does the patient have difficulty dressing or bathing?: No Does the patient have difficulty doing errands alone such as visiting a doctor's office or shopping?: No    Assessment & Plan  1. Hypertension associated with type 2 diabetes mellitus ([place])  - POCT HgB A1C - HM Diabetes Foot Exam  2. Dyslipidemia associated with type 2 diabetes mellitus ([place])  Continue medication  3. Vitamin D deficiency  - Vitamin D, Ergocalciferol, (DRISDOL) 1.25 MG (50000 UNIT) CAPS capsule; Take 1 capsule (50,000 Units total) by mouth every 7 (seven) days.  Dispense: 12 capsule; Refill: 1  4. GAD (generalized anxiety disorder)  - ALPRAZolam (XANAX) 0.5 MG tablet; Take 1 tablet (0.5 mg total) by mouth daily as needed for anxiety.  Dispense: 20 tablet; Refill: 0  5. RLS (restless legs syndrome)  Stable   6. B12 deficiency  Discussed supplementation   7. Simple chronic bronchitis ([place])  Continue  Brezti   8. Migraine without aura and responsive to treatment   9. Colon cancer screening  - Fecal Globin By Immunochemistry  10. Insomnia due to other mental disorder  - QUEtiapine (SEROQUEL) 25 MG tablet; Take 1 tablet (25 mg total) by mouth at bedtime.  Dispense: 90 tablet; Refill: 1  11. Moderate persistent asthma with allergic rhinitis without complication  - Budeson-Glycopyrrol-Formoterol (BREZTRI AEROSPHERE) 160-9-4.8 MCG/ACT AERO; Inhale 2 puffs into the lungs in the morning and at bedtime.  Dispense: 3 each; Refill: 1

## 2023-01-31 ENCOUNTER — Ambulatory Visit (INDEPENDENT_AMBULATORY_CARE_PROVIDER_SITE_OTHER): Payer: BC Managed Care – PPO | Admitting: Family Medicine

## 2023-01-31 ENCOUNTER — Encounter: Payer: Self-pay | Admitting: Family Medicine

## 2023-01-31 VITALS — BP 116/68 | HR 94 | Temp 98.1°F | Resp 14 | Ht 67.0 in | Wt 196.0 lb

## 2023-01-31 DIAGNOSIS — F411 Generalized anxiety disorder: Secondary | ICD-10-CM | POA: Diagnosis not present

## 2023-01-31 DIAGNOSIS — E1169 Type 2 diabetes mellitus with other specified complication: Secondary | ICD-10-CM | POA: Diagnosis not present

## 2023-01-31 DIAGNOSIS — E785 Hyperlipidemia, unspecified: Secondary | ICD-10-CM

## 2023-01-31 DIAGNOSIS — E559 Vitamin D deficiency, unspecified: Secondary | ICD-10-CM | POA: Diagnosis not present

## 2023-01-31 DIAGNOSIS — E1159 Type 2 diabetes mellitus with other circulatory complications: Secondary | ICD-10-CM | POA: Diagnosis not present

## 2023-01-31 DIAGNOSIS — I152 Hypertension secondary to endocrine disorders: Secondary | ICD-10-CM

## 2023-01-31 DIAGNOSIS — E538 Deficiency of other specified B group vitamins: Secondary | ICD-10-CM

## 2023-01-31 DIAGNOSIS — F5105 Insomnia due to other mental disorder: Secondary | ICD-10-CM

## 2023-01-31 DIAGNOSIS — F99 Mental disorder, not otherwise specified: Secondary | ICD-10-CM

## 2023-01-31 DIAGNOSIS — G2581 Restless legs syndrome: Secondary | ICD-10-CM

## 2023-01-31 DIAGNOSIS — J410 Simple chronic bronchitis: Secondary | ICD-10-CM

## 2023-01-31 DIAGNOSIS — Z1211 Encounter for screening for malignant neoplasm of colon: Secondary | ICD-10-CM

## 2023-01-31 DIAGNOSIS — G43009 Migraine without aura, not intractable, without status migrainosus: Secondary | ICD-10-CM

## 2023-01-31 DIAGNOSIS — J4540 Moderate persistent asthma, uncomplicated: Secondary | ICD-10-CM

## 2023-01-31 LAB — POCT GLYCOSYLATED HEMOGLOBIN (HGB A1C): Hemoglobin A1C: 6.8 % — AB (ref 4.0–5.6)

## 2023-01-31 MED ORDER — BREZTRI AEROSPHERE 160-9-4.8 MCG/ACT IN AERO: 2.0000 | INHALATION_SPRAY | Freq: Two times a day (BID) | RESPIRATORY_TRACT | 1 refills | Status: DC

## 2023-01-31 MED ORDER — QUETIAPINE FUMARATE 25 MG PO TABS: 25.0000 mg | ORAL_TABLET | Freq: Every day | ORAL | 1 refills | Status: DC

## 2023-01-31 MED ORDER — ALPRAZOLAM 0.5 MG PO TABS: 0.5000 mg | ORAL_TABLET | Freq: Every day | ORAL | 0 refills | Status: DC | PRN

## 2023-01-31 MED ORDER — VITAMIN D (ERGOCALCIFEROL) 1.25 MG (50000 UNIT) PO CAPS: 50000.0000 [IU] | ORAL_CAPSULE | ORAL | 1 refills | Status: DC

## 2023-02-22 ENCOUNTER — Other Ambulatory Visit: Payer: Self-pay | Admitting: Family Medicine

## 2023-04-17 ENCOUNTER — Other Ambulatory Visit: Payer: Self-pay | Admitting: Family Medicine

## 2023-04-17 DIAGNOSIS — E1159 Type 2 diabetes mellitus with other circulatory complications: Secondary | ICD-10-CM

## 2023-04-17 DIAGNOSIS — I10 Essential (primary) hypertension: Secondary | ICD-10-CM

## 2023-04-17 DIAGNOSIS — F321 Major depressive disorder, single episode, moderate: Secondary | ICD-10-CM

## 2023-04-17 DIAGNOSIS — F411 Generalized anxiety disorder: Secondary | ICD-10-CM

## 2023-05-23 LAB — HM DIABETES EYE EXAM

## 2023-05-30 ENCOUNTER — Encounter: Payer: Self-pay | Admitting: Family Medicine

## 2023-05-30 DIAGNOSIS — G43009 Migraine without aura, not intractable, without status migrainosus: Secondary | ICD-10-CM

## 2023-05-30 MED ORDER — RIZATRIPTAN BENZOATE 10 MG PO TBDP: 10.0000 mg | ORAL_TABLET | ORAL | 0 refills | Status: DC | PRN

## 2023-06-01 NOTE — Progress Notes (Deleted)
Name: Destiny Atkinson   MRN: [id number]    DOB: 08-22-1976   Date:06/01/2023       Progress Note  Subjective  Chief Complaint  Follow Up  HPI  Migraine : Migraine is not associated nausea or vomiting, but is associated with photophobia. Migraine is described as a sharp pain over left eye and radiates to left temporal area and sometimes nuchal area. Takes Maxalt first and sometimes Bernita Raisin but no episodes in a while . Unchanged    HTN: she has been taking losartan low dose and denies dizziness, bp towards low range of normal, discussed taking half dose and monitor    GAD/Major Depression: she left [place] and started a new job at [employer] in [place] back in May 2017, she left [employer] March 2018 when she walked away from her job,  and was unemployed until 06/2017. 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 She has been taking lexapro daily, buspar and occasionally alprazolam, she is due for refill of 18 tablets to last 3 months on Nov 30 th, however her regular medications are not going to be filled until Dec 14th, we will give her 21 pills to last until mid March and all her medications can be in sink.  We gave her seroquel last visit to help her sleep and it has helped but depression and anxiety still not controlled so we will stop seroquel and tried Vraylar but not covered by insurance. 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 She has associated HTN, dyslipidemia and obesity. She tried Rybelsus, took it for 2 months but unable to tolerate nausea, we gave her Marcelline Deist but stopped  due to yeast infection. Currently on diet only. A1C still at goal at 6.5 % but trending up It was 6.7 % back in 2022 , today is up to 6.8 %. Discussed Metformin but she would like to hold off for now     RLS/Peripheral Neuropathy: She has a  need to move her legs when sitting down, also states that at night her legs moves constantly. Numbness had been intermittent on hands and feet. She was on Requip but stopped working after 6 months, she has failed gabapentin, she was on Mirapax , but doing well on Requip    AR: she his allergic to cats, but has 3 cats at home, also allergic to dogs and has one of them. She tries to keep them out of her bedroom. She also has environmental allergies and it is not good at this time    Asthma Moderate / Chronic bronchitis : she still smokes, about 6 cigarettes per day and vapes the other times. She is back on Brezti and is doing better, still wheezes at night  She has an occasional cough but no SOB  Dyslipidemia: reviewed last labs with patient again and reminded her LDL goal is below 70 , last LDL high again. She still refuses statin therapy   Vitamin D deficiency: she has been compliant with vitamin D weekly   B12 deficiency: she will resume supplements  Patient Active Problem List   Diagnosis Date Noted   Hypertension associated with type 2 diabetes mellitus ([facility]) 10/26/2021   Moderate major depression ([facility]) 10/26/2021   Vitamin D deficiency 12/28/2016   Mild  major depression ([facility]) 11/17/2015   GAD (generalized anxiety disorder) 05/06/2015   Moderate persistent asthma with allergic rhinitis without complication 05/06/2015   Mobitz type I incomplete atrioventricular block 05/06/2015   B12 deficiency 05/06/2015   Essential (primary) hypertension 05/06/2015   Irregular bleeding 05/06/2015   Migraine without aura and responsive to treatment 05/06/2015   BMI 31.0-31.9,adult 05/06/2015   Peripheral neuropathy 05/06/2015   Perennial allergic rhinitis 05/06/2015   Restless leg 05/06/2015   Tobacco abuse 05/06/2015    Past Surgical History:  Procedure Laterality Date   COLONOSCOPY     in 8th grade, not as adult   ESOPHAGOGASTRODUODENOSCOPY     age 2   NASAL SINUS SURGERY  01/21/2010    septoplasty/ sinus surgery   WISDOM TOOTH EXTRACTION  age 70    Family History  Problem Relation Age of Onset   COPD Mother    Fibromyalgia Mother    Hypertension Mother    Stroke Mother    Asthma Mother    Arthritis Mother    Depression Mother    Heart disease Mother    Crohn's disease Mother    Diabetes Father    Diabetes type II Daughter    Diabetes type II Daughter    Kidney Stones Daughter    Bipolar disorder Maternal Grandmother    Stomach cancer Paternal Grandmother 39   Epilepsy Brother    Ovarian cancer Other 45   Ovarian cancer Other 50   Breast cancer Neg Hx     Social History   Tobacco Use   Smoking status: Every Day    Current packs/day: 0.25    Average packs/day: 0.3 packs/day for 27.9 years (7.0 ttl pk-yrs)    Types: Cigarettes, E-cigarettes    Start date: 06/29/1995   Smokeless tobacco: Never  Substance Use Topics   Alcohol use: Yes    Alcohol/week: 0.0 standard drinks of alcohol    Comment: rarely     Current Outpatient Medications:    albuterol (VENTOLIN HFA) 108 (90 Base) MCG/ACT inhaler, USE 2 INHALATIONS EVERY 6 HOURS AS NEEDED FOR WHEEZING OR SHORTNESS OF BREATH, Disp: 17 g, Rfl: 4   ALPRAZolam (XANAX) 0.5 MG tablet, Take 1 tablet (0.5 mg total) by mouth daily as needed for anxiety., Disp: 20 tablet, Rfl: 0   B Complex Vitamins (VITAMIN B-COMPLEX) TABS, Take 1 tablet by mouth daily., Disp: , Rfl:    Budeson-Glycopyrrol-Formoterol (BREZTRI AEROSPHERE) 160-9-4.8 MCG/ACT AERO, Inhale 2 puffs into the lungs in the morning and at bedtime., Disp: 3 each, Rfl: 1   busPIRone (BUSPAR) 5 MG tablet, Take 1 tablet (5 mg total) by mouth daily as needed., Disp: 90 tablet, Rfl: 1   Cyanocobalamin (VITAMIN B12 PO), Take by mouth., Disp: , Rfl:    escitalopram (LEXAPRO) 20 MG tablet, TAKE 1 TABLET DAILY, Disp: 90 tablet, Rfl: 0   ipratropium-albuterol (DUONEB) 0.5-2.5 (3) MG/3ML SOLN, Take 3 mLs by nebulization 3 (three) times daily as needed (wheeze, coughing  fits, chest tightness, SOB)., Disp: 180 mL, Rfl: 2   levocetirizine (XYZAL) 5 MG tablet, Take 1 tablet (5 mg total) by mouth daily., Disp: 90 tablet, Rfl: 1   levonorgestrel (LILETTA) 20.1 MCG/DAY IUD IUD, 1 each by Intrauterine route once., Disp: , Rfl:    lidocaine (XYLOCAINE) 2 % solution, Use as directed 5-10 mLs in the mouth or throat every 4 (four) hours as needed for mouth pain., Disp: 100 mL, Rfl: 0   loratadine (CLARITIN) 10 MG tablet, Take 10 mg by  mouth daily., Disp: , Rfl:    losartan (COZAAR) 25 MG tablet, TAKE 1 TABLET DAILY, Disp: 90 tablet, Rfl: 0   nystatin (MYCOSTATIN) 100000 UNIT/ML suspension, Take 5 mLs (500,000 Units total) by mouth 4 (four) times daily as needed (swish and swallow for oral thrush)., Disp: 60 mL, Rfl: 2   QUEtiapine (SEROQUEL) 25 MG tablet, Take 1 tablet (25 mg total) by mouth at bedtime., Disp: 90 tablet, Rfl: 1   rizatriptan (MAXALT-MLT) 10 MG disintegrating tablet, Take 1 tablet (10 mg total) by mouth as needed for migraine. May repeat in 2 hours if needed, Disp: 27 tablet, Rfl: 0   rOPINIRole (REQUIP) 1 MG tablet, Take 1 tablet (1 mg total) by mouth at bedtime., Disp: 90 tablet, Rfl: 1   Ubrogepant (UBRELVY) 100 MG TABS, Take 1 tablet by mouth daily as needed., Disp: 10 tablet, Rfl: 2   Vitamin D, Ergocalciferol, (DRISDOL) 1.25 MG (50000 UNIT) CAPS capsule, Take 1 capsule (50,000 Units total) by mouth every 7 (seven) days., Disp: 12 capsule, Rfl: 1  Allergies  Allergen Reactions   Dog Epithelium     Anaphylaxis with Rabbits.  Dogs, cats, birds cause swelling, extreme itching.   Dust Mite Extract    Pollen Extract    Soap    Tape    Tree Extract     I personally reviewed active problem list, medication list, allergies, family history, social history, health maintenance with the patient/caregiver today.   ROS  ***  Objective  There were no vitals filed for this visit.  There is no height or weight on file to calculate BMI.  Physical  Exam ***  Recent Results (from the past 2160 hour(s))  HM DIABETES EYE EXAM     Status: None   Collection Time: 05/23/23 12:00 AM  Result Value Ref Range   HM Diabetic Eye Exam No Retinopathy No Retinopathy    PHQ2/9:    01/31/2023    8:16 AM 11/18/2022    1:34 PM 10/26/2022    2:35 PM 10/12/2022   10:33 AM 09/06/2022   10:38 AM  Depression screen PHQ 2/9  Decreased Interest 3 0 0 2 0  Down, Depressed, Hopeless 3 0 0 2 0  PHQ - 2 Score 6 0 0 4 0  Altered sleeping 2 0 0 0 0  Tired, decreased energy 2 0 0 2 0  Change in appetite 2 0 0 2 0  Feeling bad or failure about yourself  1 0 0 0 0  Trouble concentrating 2 0 0 0 0  Moving slowly or fidgety/restless 0 0 0 0 0  Suicidal thoughts 0 0 0 0 0  PHQ-9 Score 15 0 0 8 0  Difficult doing work/chores Very difficult  Not difficult at all Somewhat difficult Not difficult at all    phq 9 is {gen pos [id number]}   Fall Risk:    01/31/2023    8:12 AM 11/18/2022    1:34 PM 10/26/2022    2:34 PM 10/12/2022   10:32 AM 09/06/2022   10:38 AM  Fall Risk   Falls in the past year? 0 1 1 1 1   Number falls in past yr:  0 0 0 0  Injury with Fall?  0 0 0 0  Risk for fall due to : No Fall Risks No Fall Risks Impaired balance/gait Impaired balance/gait Impaired balance/gait;Impaired mobility  Follow up Falls prevention discussed Falls prevention discussed Falls prevention discussed;Education provided;Falls evaluation completed Falls prevention discussed;Education provided;Falls evaluation completed  Education provided;Falls prevention discussed      Functional Status Survey:      Assessment & Plan  *** There are no diagnoses linked to this encounter.

## 2023-06-02 ENCOUNTER — Ambulatory Visit: Payer: 59 | Admitting: Family Medicine

## 2023-06-02 DIAGNOSIS — E1169 Type 2 diabetes mellitus with other specified complication: Secondary | ICD-10-CM

## 2023-06-06 NOTE — Progress Notes (Deleted)
Name: Destiny Atkinson   MRN: [id number]    DOB: Dec 13, 1975   Date:06/06/2023       Progress Note  Subjective  Chief Complaint  Follow Up  HPI  Migraine : Migraine is not associated nausea or vomiting, but is associated with photophobia. Migraine is described as a sharp pain over left eye and radiates to left temporal area and sometimes nuchal area. Takes Maxalt first and sometimes Bernita Raisin but no episodes in a while . Unchanged    HTN: she has been taking losartan low dose and denies dizziness, bp towards low range of normal, discussed taking half dose and monitor    GAD/Major Depression: she left [employer] and started a new job at [employer] in [place] back in May 2017, she left [employer] March 2018 when she walked away from her job,  and was unemployed until 06/2017. She was back in [employer] for about one year, but switched to [facility] 07/2018. She has been taking lexapro daily, buspar and occasionally alprazolam, she is due for refill of 18 tablets to last 3 months on Nov 30 th, however her regular medications are not going to be filled until Dec 14th, we will give her 21 pills to last until mid March and all her medications can be in sink.  We gave her seroquel last visit to help her sleep and it has helped but depression and anxiety still not controlled so we will stop seroquel and tried Vraylar but not covered by insurance. 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 She has associated HTN, dyslipidemia and obesity. She tried Rybelsus, took it for 2 months but unable to tolerate nausea, we gave her Marcelline Deist but stopped  due to yeast infection. Currently on diet only. A1C still at goal at 6.5 % but trending up It was 6.7 % back in 2022 , today is up to 6.8 %. Discussed Metformin but she would like to hold off for now     RLS/Peripheral Neuropathy: She has a  need to move her legs when sitting down, also states that at night her legs moves constantly. Numbness had been intermittent on hands and feet. She was on Requip but stopped working after 6 months, she has failed gabapentin, she was on Mirapax , but doing well on Requip    AR: she his allergic to cats, but has 3 cats at home, also allergic to dogs and has one of them. She tries to keep them out of her bedroom. She also has environmental allergies and it is not good at this time    Asthma Moderate / Chronic bronchitis : she still smokes, about 6 cigarettes per day and vapes the other times. She is back on Brezti and is doing better, still wheezes at night  She has an occasional cough but no SOB  Dyslipidemia: reviewed last labs with patient again and reminded her LDL goal is below 70 , last LDL high again. She still refuses statin therapy   Vitamin D deficiency: she has been compliant with vitamin D weekly   B12 deficiency: she will resume supplements  Patient Active Problem List   Diagnosis Date Noted   Hypertension associated with type 2 diabetes mellitus (HCC) 10/26/2021   Moderate major depression (HCC) 10/26/2021   Vitamin D deficiency 12/28/2016   Mild  major depression (HCC) 11/17/2015   GAD (generalized anxiety disorder) 05/06/2015   Moderate persistent asthma with allergic rhinitis without complication 05/06/2015   Mobitz type I incomplete atrioventricular block 05/06/2015   B12 deficiency 05/06/2015   Essential (primary) hypertension 05/06/2015   Irregular bleeding 05/06/2015   Migraine without aura and responsive to treatment 05/06/2015   BMI 31.0-31.9,adult 05/06/2015   Peripheral neuropathy 05/06/2015   Perennial allergic rhinitis 05/06/2015   Restless leg 05/06/2015   Tobacco abuse 05/06/2015    Past Surgical History:  Procedure Laterality Date   COLONOSCOPY     in 8th grade, not as adult   ESOPHAGOGASTRODUODENOSCOPY     age 89   NASAL SINUS SURGERY  01/21/2010    septoplasty/ sinus surgery   WISDOM TOOTH EXTRACTION  age 35    Family History  Problem Relation Age of Onset   COPD Mother    Fibromyalgia Mother    Hypertension Mother    Stroke Mother    Asthma Mother    Arthritis Mother    Depression Mother    Heart disease Mother    Crohn's disease Mother    Diabetes Father    Diabetes type II Daughter    Diabetes type II Daughter    Kidney Stones Daughter    Bipolar disorder Maternal Grandmother    Stomach cancer Paternal Grandmother 72   Epilepsy Brother    Ovarian cancer Other 45   Ovarian cancer Other 50   Breast cancer Neg Hx     Social History   Tobacco Use   Smoking status: Every Day    Current packs/day: 0.25    Average packs/day: 0.3 packs/day for 27.9 years (7.0 ttl pk-yrs)    Types: Cigarettes, E-cigarettes    Start date: 06/29/1995   Smokeless tobacco: Never  Substance Use Topics   Alcohol use: Yes    Alcohol/week: 0.0 standard drinks of alcohol    Comment: rarely     Current Outpatient Medications:    albuterol (VENTOLIN HFA) 108 (90 Base) MCG/ACT inhaler, USE 2 INHALATIONS EVERY 6 HOURS AS NEEDED FOR WHEEZING OR SHORTNESS OF BREATH, Disp: 17 g, Rfl: 4   ALPRAZolam (XANAX) 0.5 MG tablet, Take 1 tablet (0.5 mg total) by mouth daily as needed for anxiety., Disp: 20 tablet, Rfl: 0   B Complex Vitamins (VITAMIN B-COMPLEX) TABS, Take 1 tablet by mouth daily., Disp: , Rfl:    Budeson-Glycopyrrol-Formoterol (BREZTRI AEROSPHERE) 160-9-4.8 MCG/ACT AERO, Inhale 2 puffs into the lungs in the morning and at bedtime., Disp: 3 each, Rfl: 1   busPIRone (BUSPAR) 5 MG tablet, Take 1 tablet (5 mg total) by mouth daily as needed., Disp: 90 tablet, Rfl: 1   Cyanocobalamin (VITAMIN B12 PO), Take by mouth., Disp: , Rfl:    escitalopram (LEXAPRO) 20 MG tablet, TAKE 1 TABLET DAILY, Disp: 90 tablet, Rfl: 0   ipratropium-albuterol (DUONEB) 0.5-2.5 (3) MG/3ML SOLN, Take 3 mLs by nebulization 3 (three) times daily as needed (wheeze, coughing  fits, chest tightness, SOB)., Disp: 180 mL, Rfl: 2   levocetirizine (XYZAL) 5 MG tablet, Take 1 tablet (5 mg total) by mouth daily., Disp: 90 tablet, Rfl: 1   levonorgestrel (LILETTA) 20.1 MCG/DAY IUD IUD, 1 each by Intrauterine route once., Disp: , Rfl:    lidocaine (XYLOCAINE) 2 % solution, Use as directed 5-10 mLs in the mouth or throat every 4 (four) hours as needed for mouth pain., Disp: 100 mL, Rfl: 0   loratadine (CLARITIN) 10 MG tablet, Take 10 mg by  mouth daily., Disp: , Rfl:    losartan (COZAAR) 25 MG tablet, TAKE 1 TABLET DAILY, Disp: 90 tablet, Rfl: 0   nystatin (MYCOSTATIN) 100000 UNIT/ML suspension, Take 5 mLs (500,000 Units total) by mouth 4 (four) times daily as needed (swish and swallow for oral thrush)., Disp: 60 mL, Rfl: 2   QUEtiapine (SEROQUEL) 25 MG tablet, Take 1 tablet (25 mg total) by mouth at bedtime., Disp: 90 tablet, Rfl: 1   rizatriptan (MAXALT-MLT) 10 MG disintegrating tablet, Take 1 tablet (10 mg total) by mouth as needed for migraine. May repeat in 2 hours if needed, Disp: 27 tablet, Rfl: 0   rOPINIRole (REQUIP) 1 MG tablet, Take 1 tablet (1 mg total) by mouth at bedtime., Disp: 90 tablet, Rfl: 1   Ubrogepant (UBRELVY) 100 MG TABS, Take 1 tablet by mouth daily as needed., Disp: 10 tablet, Rfl: 2   Vitamin D, Ergocalciferol, (DRISDOL) 1.25 MG (50000 UNIT) CAPS capsule, Take 1 capsule (50,000 Units total) by mouth every 7 (seven) days., Disp: 12 capsule, Rfl: 1  Allergies  Allergen Reactions   Dog Epithelium     Anaphylaxis with Rabbits.  Dogs, cats, birds cause swelling, extreme itching.   Dust Mite Extract    Pollen Extract    Soap    Tape    Tree Extract     I personally reviewed active problem list, medication list, allergies, family history, social history, health maintenance with the patient/caregiver today.   ROS  ***  Objective  There were no vitals filed for this visit.  There is no height or weight on file to calculate BMI.  Physical  Exam ***  Recent Results (from the past 2160 hour(s))  HM DIABETES EYE EXAM     Status: None   Collection Time: 05/23/23 12:00 AM  Result Value Ref Range   HM Diabetic Eye Exam No Retinopathy No Retinopathy    PHQ2/9:    01/31/2023    8:16 AM 11/18/2022    1:34 PM 10/26/2022    2:35 PM 10/12/2022   10:33 AM 09/06/2022   10:38 AM  Depression screen PHQ 2/9  Decreased Interest 3 0 0 2 0  Down, Depressed, Hopeless 3 0 0 2 0  PHQ - 2 Score 6 0 0 4 0  Altered sleeping 2 0 0 0 0  Tired, decreased energy 2 0 0 2 0  Change in appetite 2 0 0 2 0  Feeling bad or failure about yourself  1 0 0 0 0  Trouble concentrating 2 0 0 0 0  Moving slowly or fidgety/restless 0 0 0 0 0  Suicidal thoughts 0 0 0 0 0  PHQ-9 Score 15 0 0 8 0  Difficult doing work/chores Very difficult  Not difficult at all Somewhat difficult Not difficult at all    phq 9 is {gen pos [id number]}   Fall Risk:    01/31/2023    8:12 AM 11/18/2022    1:34 PM 10/26/2022    2:34 PM 10/12/2022   10:32 AM 09/06/2022   10:38 AM  Fall Risk   Falls in the past year? 0 1 1 1 1   Number falls in past yr:  0 0 0 0  Injury with Fall?  0 0 0 0  Risk for fall due to : No Fall Risks No Fall Risks Impaired balance/gait Impaired balance/gait Impaired balance/gait;Impaired mobility  Follow up Falls prevention discussed Falls prevention discussed Falls prevention discussed;Education provided;Falls evaluation completed Falls prevention discussed;Education provided;Falls evaluation completed  Education provided;Falls prevention discussed      Functional Status Survey:      Assessment & Plan  *** There are no diagnoses linked to this encounter.

## 2023-06-07 ENCOUNTER — Ambulatory Visit: Payer: 59 | Admitting: Family Medicine

## 2023-06-14 ENCOUNTER — Other Ambulatory Visit: Payer: Self-pay | Admitting: Family Medicine

## 2023-06-14 DIAGNOSIS — G2581 Restless legs syndrome: Secondary | ICD-10-CM

## 2023-06-28 ENCOUNTER — Other Ambulatory Visit: Payer: Self-pay | Admitting: Family Medicine

## 2023-06-28 ENCOUNTER — Encounter: Payer: Self-pay | Admitting: Family Medicine

## 2023-06-28 DIAGNOSIS — G43009 Migraine without aura, not intractable, without status migrainosus: Secondary | ICD-10-CM

## 2023-06-28 DIAGNOSIS — F411 Generalized anxiety disorder: Secondary | ICD-10-CM

## 2023-06-28 DIAGNOSIS — F321 Major depressive disorder, single episode, moderate: Secondary | ICD-10-CM

## 2023-06-28 DIAGNOSIS — I10 Essential (primary) hypertension: Secondary | ICD-10-CM

## 2023-06-28 DIAGNOSIS — G2581 Restless legs syndrome: Secondary | ICD-10-CM

## 2023-06-28 DIAGNOSIS — E1159 Type 2 diabetes mellitus with other circulatory complications: Secondary | ICD-10-CM

## 2023-06-29 ENCOUNTER — Other Ambulatory Visit: Payer: Self-pay

## 2023-06-29 DIAGNOSIS — F411 Generalized anxiety disorder: Secondary | ICD-10-CM

## 2023-06-29 DIAGNOSIS — F321 Major depressive disorder, single episode, moderate: Secondary | ICD-10-CM

## 2023-06-29 NOTE — Telephone Encounter (Signed)
Teed up

## 2023-06-29 NOTE — Telephone Encounter (Signed)
Pt requesting through MyChart message

## 2023-06-30 MED ORDER — ROPINIROLE HCL 1 MG PO TABS
1.0000 mg | ORAL_TABLET | Freq: Every day | ORAL | 0 refills | Status: DC
Start: 2023-06-30 — End: 2023-07-13

## 2023-06-30 MED ORDER — RIZATRIPTAN BENZOATE 10 MG PO TBDP
10.0000 mg | ORAL_TABLET | ORAL | 0 refills | Status: DC | PRN
Start: 2023-06-30 — End: 2024-04-10

## 2023-06-30 MED ORDER — LOSARTAN POTASSIUM 25 MG PO TABS
25.0000 mg | ORAL_TABLET | Freq: Every day | ORAL | 0 refills | Status: DC
Start: 2023-06-30 — End: 2023-07-13

## 2023-06-30 MED ORDER — BUSPIRONE HCL 5 MG PO TABS
5.0000 mg | ORAL_TABLET | Freq: Every day | ORAL | 0 refills | Status: DC | PRN
Start: 2023-06-30 — End: 2023-07-13

## 2023-06-30 MED ORDER — ESCITALOPRAM OXALATE 20 MG PO TABS
20.0000 mg | ORAL_TABLET | Freq: Every day | ORAL | 0 refills | Status: DC
Start: 2023-06-30 — End: 2023-07-13

## 2023-07-03 ENCOUNTER — Other Ambulatory Visit: Payer: Self-pay | Admitting: Family Medicine

## 2023-07-03 ENCOUNTER — Encounter: Payer: Self-pay | Admitting: Family Medicine

## 2023-07-03 DIAGNOSIS — Z87898 Personal history of other specified conditions: Secondary | ICD-10-CM

## 2023-07-03 MED ORDER — SCOPOLAMINE 1 MG/3DAYS TD PT72: 1.0000 | MEDICATED_PATCH | TRANSDERMAL | 0 refills | Status: DC

## 2023-07-12 NOTE — Progress Notes (Unsigned)
Name: Destiny Atkinson   MRN: [id number]    DOB: 1976/02/26   Date:07/13/2023       Progress Note  Subjective  Chief Complaint  Follow Up  HPI  Migraine : Migraine is not associated nausea or vomiting, but is associated with photophobia. Migraine is described as a sharp pain over left eye and radiates to left temporal area and sometimes nuchal area. Takes Maxalt first since it works faster for her,  occasionally takes [place]. Migraine episodes have increased in frequency lately, one episode every other week.    HTN: she has been taking losartan one pill and BP is okay, no dizziness, chest pain or palpitation    GAD/Major Depression: currently on a new job in [place], that has been better - less stressful, however worries constantly about her 45 yo daughter that has multiple medication problems and misses a lot of days at school and no definitive diagnosis for her recurrent nausea and vomiting. She is taking Lexapro and buspar every day, seroquel at night and very seldom alprazolam for anxiety / panic attacks.   DMII: . She has associated HTN, dyslipidemia and obesity. She tried Rybelsus, took it for 2 months but unable to tolerate nausea, we gave her Marcelline Deist but stopped  due to yeast infection. Currently on diet only. A1C still at goal at 6.5 % but trending up It was 6.7 % back in 2022 , it went from 6.8 % and today is up to 6.9 % , she wants to continue life style modifications until A1C goes to 7 % Denies polyphagia, polydipsia or polyuria    RLS/Peripheral Neuropathy: She has a need to move her legs when sitting down, also states that at night her legs moves constantly. Numbness had been intermittent on hands and feet. She has been taking Requip at night and it was working well, however currently having day time symptoms, she tried taking in am and it helped symptoms and did not cause sedation, we will adjust dose to BID    AR: she his allergic to cats, currently only has one cat and one dog  She tries to keep them out of her bedroom. She also has environmental allergies Taking anti-histamines daily    Asthma Moderate / Chronic bronchitis : she still smokes, about 6 cigarettes per day and vapes the other times. She is back on Brezti and is doing better. She states symptoms seems controlled at this time, occasionally cough, no longer wheezing at night   Dyslipidemia: reviewed last labs with patient again and reminded her LDL goal is below 70 , last LDL high again. She still refuses statin therapy . We will recheck labs today   Vitamin D deficiency: she has been compliant with vitamin D weekly   B12 deficiency: she is taking B12 otc   Patient Active Problem List   Diagnosis Date Noted   Hypertension associated with type 2 diabetes mellitus (HCC) 10/26/2021   Moderate major depression (HCC) 10/26/2021   Vitamin D deficiency 12/28/2016   Mild major depression (HCC) 11/17/2015   GAD (generalized anxiety disorder) 05/06/2015   Moderate persistent asthma with allergic rhinitis without complication 05/06/2015   Mobitz type I incomplete atrioventricular block 05/06/2015   B12 deficiency 05/06/2015   Essential (primary) hypertension 05/06/2015   Irregular bleeding 05/06/2015   Migraine without aura and responsive to treatment 05/06/2015   BMI 31.0-31.9,adult 05/06/2015   Peripheral neuropathy 05/06/2015   Perennial allergic rhinitis 05/06/2015   Restless leg 05/06/2015   Tobacco abuse  05/06/2015    Past Surgical History:  Procedure Laterality Date   COLONOSCOPY     in 8th grade, not as adult   ESOPHAGOGASTRODUODENOSCOPY     age 73   NASAL SINUS SURGERY  01/21/2010   septoplasty/ sinus surgery   WISDOM TOOTH EXTRACTION  age 80    Family History  Problem Relation Age of Onset   COPD Mother    Fibromyalgia Mother    Hypertension Mother    Stroke Mother    Asthma Mother    Arthritis Mother    Depression Mother    Heart disease Mother    Crohn's disease Mother     Diabetes Father    Diabetes type II Daughter    Diabetes type II Daughter    Kidney Stones Daughter    Bipolar disorder Maternal Grandmother    Stomach cancer Paternal Grandmother 32   Epilepsy Brother    Ovarian cancer Other 45   Ovarian cancer Other 50   Breast cancer Neg Hx     Social History   Tobacco Use   Smoking status: Every Day    Current packs/day: 0.25    Average packs/day: 0.3 packs/day for 28.0 years (7.0 ttl pk-yrs)    Types: Cigarettes, E-cigarettes    Start date: 06/29/1995   Smokeless tobacco: Never  Substance Use Topics   Alcohol use: Yes    Alcohol/week: 0.0 standard drinks of alcohol    Comment: rarely     Current Outpatient Medications:    albuterol (VENTOLIN HFA) 108 (90 Base) MCG/ACT inhaler, USE 2 INHALATIONS EVERY 6 HOURS AS NEEDED FOR WHEEZING OR SHORTNESS OF BREATH, Disp: 17 g, Rfl: 4   B Complex Vitamins (VITAMIN B-COMPLEX) TABS, Take 1 tablet by mouth daily., Disp: , Rfl:    cyanocobalamin (VITAMIN B12) 500 MCG tablet, Take 500 mcg by mouth daily., Disp: , Rfl:    ipratropium-albuterol (DUONEB) 0.5-2.5 (3) MG/3ML SOLN, Take 3 mLs by nebulization 3 (three) times daily as needed (wheeze, coughing fits, chest tightness, SOB)., Disp: 180 mL, Rfl: 2   levocetirizine (XYZAL) 5 MG tablet, Take 1 tablet (5 mg total) by mouth daily., Disp: 90 tablet, Rfl: 1   levonorgestrel (LILETTA) 20.1 MCG/DAY IUD IUD, 1 each by Intrauterine route once., Disp: , Rfl:    loratadine (CLARITIN) 10 MG tablet, Take 10 mg by mouth daily., Disp: , Rfl:    rizatriptan (MAXALT-MLT) 10 MG disintegrating tablet, Take 1 tablet (10 mg total) by mouth as needed for migraine. May repeat in 2 hours if needed, Disp: 27 tablet, Rfl: 0   Ubrogepant (UBRELVY) 100 MG TABS, Take 1 tablet by mouth daily as needed., Disp: 10 tablet, Rfl: 2   ALPRAZolam (XANAX) 0.5 MG tablet, Take 1 tablet (0.5 mg total) by mouth daily as needed for anxiety., Disp: 20 tablet, Rfl: 0   Budeson-Glycopyrrol-Formoterol  (BREZTRI AEROSPHERE) 160-9-4.8 MCG/ACT AERO, Inhale 2 puffs into the lungs in the morning and at bedtime., Disp: 1 each, Rfl: 5   busPIRone (BUSPAR) 5 MG tablet, Take 1 tablet (5 mg total) by mouth daily as needed., Disp: 90 tablet, Rfl: 0   escitalopram (LEXAPRO) 20 MG tablet, Take 1 tablet (20 mg total) by mouth daily., Disp: 90 tablet, Rfl: 0   losartan (COZAAR) 25 MG tablet, Take 1 tablet (25 mg total) by mouth daily., Disp: 90 tablet, Rfl: 0   QUEtiapine (SEROQUEL) 25 MG tablet, Take 1 tablet (25 mg total) by mouth at bedtime., Disp: 90 tablet, Rfl: 1  rOPINIRole (REQUIP) 1 MG tablet, Take 1 tablet (1 mg total) by mouth 2 (two) times daily., Disp: 180 tablet, Rfl: 1   Vitamin D, Ergocalciferol, (DRISDOL) 1.25 MG (50000 UNIT) CAPS capsule, Take 1 capsule (50,000 Units total) by mouth every 7 (seven) days., Disp: 12 capsule, Rfl: 1  Allergies  Allergen Reactions   Dog Epithelium     Anaphylaxis with Rabbits.  Dogs, cats, birds cause swelling, extreme itching.   Dust Mite Extract    Pollen Extract    Soap    Tape    Tree Extract     I personally reviewed active problem list, medication list, allergies, family history, social history, health maintenance with the patient/caregiver today.   ROS  Constitutional: Negative for fever or weight change.  Respiratory: Negative for cough and shortness of breath.   Cardiovascular: Negative for chest pain or palpitations.  Gastrointestinal: Negative for abdominal pain, no bowel changes.  Musculoskeletal: Negative for gait problem or joint swelling.  Skin: Negative for rash.  Neurological: Negative for dizziness , positive for intermittent  headache.  No other specific complaints in a complete review of systems (except as listed in HPI above).   Objective  Vitals:   07/13/23 0846  BP: 118/70  Pulse: 98  Resp: 16  Temp: 97.8 F (36.6 C)  TempSrc: Oral  SpO2: 95%  Weight: 196 lb 6.4 oz (89.1 kg)  Height: 5\' 6"  (1.676 m)    Body  mass index is 31.7 kg/m.  Physical Exam  Constitutional: Patient appears well-developed and well-nourished. Obese  No distress.  HEENT: head atraumatic, normocephalic, pupils equal and reactive to light, neck supple Cardiovascular: Normal rate, regular rhythm and normal heart sounds.  No murmur heard. No BLE edema. Pulmonary/Chest: Effort normal and breath sounds normal. No respiratory distress. Abdominal: Soft.  There is no tenderness. Psychiatric: Patient has a normal mood and affect. behavior is normal. Judgment and thought content normal.   Recent Results (from the past 2160 hour(s))  HM DIABETES EYE EXAM     Status: None   Collection Time: 05/23/23 12:00 AM  Result Value Ref Range   HM Diabetic Eye Exam No Retinopathy No Retinopathy  POCT HgB A1C     Status: Abnormal   Collection Time: 07/13/23  8:50 AM  Result Value Ref Range   Hemoglobin A1C 6.9 (A) 4.0 - 5.6 %   HbA1c POC (<> result, manual entry)     HbA1c, POC (prediabetic range)     HbA1c, POC (controlled diabetic range)      PHQ2/9:    07/13/2023    8:49 AM 01/31/2023    8:16 AM 11/18/2022    1:34 PM 10/26/2022    2:35 PM 10/12/2022   10:33 AM  Depression screen PHQ 2/9  Decreased Interest 0 3 0 0 2  Down, Depressed, Hopeless 0 3 0 0 2  PHQ - 2 Score 0 6 0 0 4  Altered sleeping 2 2 0 0 0  Tired, decreased energy 2 2 0 0 2  Change in appetite 0 2 0 0 2  Feeling bad or failure about yourself  1 1 0 0 0  Trouble concentrating 0 2 0 0 0  Moving slowly or fidgety/restless 0 0 0 0 0  Suicidal thoughts 0 0 0 0 0  PHQ-9 Score 5 15 0 0 8  Difficult doing work/chores Not difficult at all Very difficult  Not difficult at all Somewhat difficult    phq 9 is positive  07/13/2023    8:50 AM 01/31/2023    8:17 AM 10/12/2022   11:07 AM 09/06/2022   10:38 AM  GAD 7 : Generalized Anxiety Score  Nervous, Anxious, on Edge 2 3 3  0  Control/stop worrying 1 3 1  0  Worry too much - different things 1 3 2  0  Trouble  relaxing 1 3 3  0  Restless 2 1 1  0  Easily annoyed or irritable 1 2 3  0  Afraid - awful might happen 0 3 1 0  Total GAD 7 Score 8 18 14  0  Anxiety Difficulty Somewhat difficult Very difficult Very difficult Not difficult at all      Fall Risk:    07/13/2023    8:48 AM 01/31/2023    8:12 AM 11/18/2022    1:34 PM 10/26/2022    2:34 PM 10/12/2022   10:32 AM  Fall Risk   Falls in the past year? 0 0 1 1 1   Number falls in past yr:   0 0 0  Injury with Fall?   0 0 0  Risk for fall due to : No Fall Risks No Fall Risks No Fall Risks Impaired balance/gait Impaired balance/gait  Follow up Falls prevention discussed Falls prevention discussed Falls prevention discussed Falls prevention discussed;Education provided;Falls evaluation completed Falls prevention discussed;Education provided;Falls evaluation completed      Functional Status Survey: Is the patient deaf or have difficulty hearing?: No Does the patient have difficulty seeing, even when wearing glasses/contacts?: No Does the patient have difficulty concentrating, remembering, or making decisions?: No Does the patient have difficulty walking or climbing stairs?: No Does the patient have difficulty dressing or bathing?: No Does the patient have difficulty doing errands alone such as visiting a doctor's office or shopping?: No    Assessment & Plan  1. Hypertension associated with type 2 diabetes mellitus (HCC)  - POCT HgB A1C - Urine Microalbumin w/creat. ratio - losartan (COZAAR) 25 MG tablet; Take 1 tablet (25 mg total) by mouth daily.  Dispense: 90 tablet; Refill: 0  2. Simple chronic bronchitis (HCC)  On Brezti   3. Dyslipidemia associated with type 2 diabetes mellitus (HCC)  - COMPLETE METABOLIC PANEL WITH GFR - Lipid panel  4. Moderate major depression (HCC)  - busPIRone (BUSPAR) 5 MG tablet; Take 1 tablet (5 mg total) by mouth daily as needed.  Dispense: 90 tablet; Refill: 0 - escitalopram (LEXAPRO) 20 MG tablet;  Take 1 tablet (20 mg total) by mouth daily.  Dispense: 90 tablet; Refill: 0  5. Migraine without aura and responsive to treatment  On medication   6. B12 deficiency  - B12 and Folate Panel - CBC with Differential/Platelet  7. RLS (restless legs syndrome)  - rOPINIRole (REQUIP) 1 MG tablet; Take 1 tablet (1 mg total) by mouth 2 (two) times daily.  Dispense: 180 tablet; Refill: 1 - Ferritin  8. Vitamin D deficiency  - VITAMIN D 25 Hydroxy (Vit-D Deficiency, Fractures) - Vitamin D, Ergocalciferol, (DRISDOL) 1.25 MG (50000 UNIT) CAPS capsule; Take 1 capsule (50,000 Units total) by mouth every 7 (seven) days.  Dispense: 12 capsule; Refill: 1  9. GAD (generalized anxiety disorder)  - ALPRAZolam (XANAX) 0.5 MG tablet; Take 1 tablet (0.5 mg total) by mouth daily as needed for anxiety.  Dispense: 20 tablet; Refill: 0 - busPIRone (BUSPAR) 5 MG tablet; Take 1 tablet (5 mg total) by mouth daily as needed.  Dispense: 90 tablet; Refill: 0 - escitalopram (LEXAPRO) 20 MG tablet; Take 1 tablet (  20 mg total) by mouth daily.  Dispense: 90 tablet; Refill: 0  10. Moderate persistent asthma with allergic rhinitis without complication  - Budeson-Glycopyrrol-Formoterol (BREZTRI AEROSPHERE) 160-9-4.8 MCG/ACT AERO; Inhale 2 puffs into the lungs in the morning and at bedtime.  Dispense: 1 each; Refill: 5  11. Insomnia due to other mental disorder  - QUEtiapine (SEROQUEL) 25 MG tablet; Take 1 tablet (25 mg total) by mouth at bedtime.  Dispense: 90 tablet; Refill: 1

## 2023-07-13 ENCOUNTER — Encounter: Payer: Self-pay | Admitting: Family Medicine

## 2023-07-13 ENCOUNTER — Ambulatory Visit: Payer: 59 | Admitting: Family Medicine

## 2023-07-13 VITALS — BP 118/70 | HR 98 | Temp 97.8°F | Resp 16 | Ht 66.0 in | Wt 196.4 lb

## 2023-07-13 DIAGNOSIS — F321 Major depressive disorder, single episode, moderate: Secondary | ICD-10-CM | POA: Diagnosis not present

## 2023-07-13 DIAGNOSIS — E538 Deficiency of other specified B group vitamins: Secondary | ICD-10-CM

## 2023-07-13 DIAGNOSIS — E559 Vitamin D deficiency, unspecified: Secondary | ICD-10-CM

## 2023-07-13 DIAGNOSIS — E1159 Type 2 diabetes mellitus with other circulatory complications: Secondary | ICD-10-CM

## 2023-07-13 DIAGNOSIS — F5105 Insomnia due to other mental disorder: Secondary | ICD-10-CM

## 2023-07-13 DIAGNOSIS — E1169 Type 2 diabetes mellitus with other specified complication: Secondary | ICD-10-CM | POA: Diagnosis not present

## 2023-07-13 DIAGNOSIS — G43009 Migraine without aura, not intractable, without status migrainosus: Secondary | ICD-10-CM

## 2023-07-13 DIAGNOSIS — J410 Simple chronic bronchitis: Secondary | ICD-10-CM

## 2023-07-13 DIAGNOSIS — F411 Generalized anxiety disorder: Secondary | ICD-10-CM

## 2023-07-13 DIAGNOSIS — I152 Hypertension secondary to endocrine disorders: Secondary | ICD-10-CM | POA: Diagnosis not present

## 2023-07-13 DIAGNOSIS — G2581 Restless legs syndrome: Secondary | ICD-10-CM

## 2023-07-13 DIAGNOSIS — J4540 Moderate persistent asthma, uncomplicated: Secondary | ICD-10-CM

## 2023-07-13 LAB — POCT GLYCOSYLATED HEMOGLOBIN (HGB A1C): Hemoglobin A1C: 6.9 % — AB (ref 4.0–5.6)

## 2023-07-13 MED ORDER — VITAMIN D (ERGOCALCIFEROL) 1.25 MG (50000 UNIT) PO CAPS
50000.0000 [IU] | ORAL_CAPSULE | ORAL | 1 refills | Status: DC
Start: 2023-07-13 — End: 2024-01-29

## 2023-07-13 MED ORDER — LOSARTAN POTASSIUM 25 MG PO TABS
25.0000 mg | ORAL_TABLET | Freq: Every day | ORAL | 0 refills | Status: DC
Start: 2023-07-13 — End: 2023-11-16

## 2023-07-13 MED ORDER — BREZTRI AEROSPHERE 160-9-4.8 MCG/ACT IN AERO
2.0000 | INHALATION_SPRAY | Freq: Two times a day (BID) | RESPIRATORY_TRACT | 5 refills | Status: DC
Start: 2023-07-13 — End: 2023-11-16

## 2023-07-13 MED ORDER — ESCITALOPRAM OXALATE 20 MG PO TABS
20.0000 mg | ORAL_TABLET | Freq: Every day | ORAL | 0 refills | Status: DC
Start: 2023-07-13 — End: 2023-11-16

## 2023-07-13 MED ORDER — ALPRAZOLAM 0.5 MG PO TABS: 0.5000 mg | ORAL_TABLET | Freq: Every day | ORAL | 0 refills | Status: DC | PRN

## 2023-07-13 MED ORDER — QUETIAPINE FUMARATE 25 MG PO TABS
25.0000 mg | ORAL_TABLET | Freq: Every day | ORAL | 1 refills | Status: DC
Start: 2023-07-13 — End: 2023-11-16

## 2023-07-13 MED ORDER — ROPINIROLE HCL 1 MG PO TABS
1.0000 mg | ORAL_TABLET | Freq: Two times a day (BID) | ORAL | 1 refills | Status: DC
Start: 2023-07-13 — End: 2023-09-21

## 2023-07-13 MED ORDER — BUSPIRONE HCL 5 MG PO TABS
5.0000 mg | ORAL_TABLET | Freq: Every day | ORAL | 0 refills | Status: DC | PRN
Start: 2023-07-13 — End: 2023-11-16

## 2023-07-14 LAB — LIPID PANEL
Cholesterol: 219 mg/dL — ABNORMAL HIGH (ref <200)
HDL: 55 mg/dL (ref >=50)
LDL Cholesterol (Calc): 132 mg/dL — ABNORMAL HIGH
Non-HDL Cholesterol (Calc): 164 mg/dL — ABNORMAL HIGH (ref <130)
Total CHOL/HDL Ratio: 4 (calc) (ref <5.0)
Triglycerides: 182 mg/dL — ABNORMAL HIGH (ref <150)

## 2023-07-14 LAB — COMPLETE METABOLIC PANEL WITH GFR
AG Ratio: 2.1 (calc) (ref 1.0–2.5)
ALT: 17 U/L (ref 6–29)
AST: 18 U/L (ref 10–35)
Albumin: 4.6 g/dL (ref 3.6–5.1)
Alkaline phosphatase (APISO): 75 U/L (ref 31–125)
BUN: 12 mg/dL (ref 7–25)
CO2: 28 mmol/L (ref 20–32)
Calcium: 10 mg/dL (ref 8.6–10.2)
Chloride: 104 mmol/L (ref 98–110)
Creat: 0.87 mg/dL (ref 0.50–0.99)
Globulin: 2.2 g/dL (calc) (ref 1.9–3.7)
Glucose, Bld: 123 mg/dL — ABNORMAL HIGH (ref 65–99)
Potassium: 4.6 mmol/L (ref 3.5–5.3)
Sodium: 141 mmol/L (ref 135–146)
Total Bilirubin: 0.5 mg/dL (ref 0.2–1.2)
Total Protein: 6.8 g/dL (ref 6.1–8.1)
eGFR: 84 mL/min/{1.73_m2} (ref >=60)

## 2023-07-14 LAB — CBC WITH DIFFERENTIAL/PLATELET
Absolute Monocytes: 460 {cells}/uL (ref 200–950)
Basophils Absolute: 57 {cells}/uL (ref 0–200)
Basophils Relative: 0.9 %
Eosinophils Absolute: 176 {cells}/uL (ref 15–500)
Eosinophils Relative: 2.8 %
HCT: 43.4 % (ref 35.0–45.0)
Hemoglobin: 14.7 g/dL (ref 11.7–15.5)
Lymphs Abs: 1997 {cells}/uL (ref 850–3900)
MCH: 33.1 pg — ABNORMAL HIGH (ref 27.0–33.0)
MCHC: 33.9 g/dL (ref 32.0–36.0)
MCV: 97.7 fL (ref 80.0–100.0)
MPV: 9.7 fL (ref 7.5–12.5)
Monocytes Relative: 7.3 %
Neutro Abs: 3610 {cells}/uL (ref 1500–7800)
Neutrophils Relative %: 57.3 %
Platelets: 285 10*3/uL (ref 140–400)
RBC: 4.44 10*6/uL (ref 3.80–5.10)
RDW: 12.3 % (ref 11.0–15.0)
Total Lymphocyte: 31.7 %
WBC: 6.3 10*3/uL (ref 3.8–10.8)

## 2023-07-14 LAB — B12 AND FOLATE PANEL
Folate: 8.9 ng/mL
Vitamin B-12: 687 pg/mL (ref 200–1100)

## 2023-07-14 LAB — VITAMIN D 25 HYDROXY (VIT D DEFICIENCY, FRACTURES): Vit D, 25-Hydroxy: 64 ng/mL (ref 30–100)

## 2023-07-14 LAB — MICROALBUMIN / CREATININE URINE RATIO
Creatinine, Urine: 164 mg/dL (ref 20–275)
Microalb Creat Ratio: 1 mg/g{creat} (ref <30)
Microalb, Ur: 0.2 mg/dL

## 2023-07-14 LAB — FERRITIN: Ferritin: 78 ng/mL (ref 16–232)

## 2023-07-21 ENCOUNTER — Other Ambulatory Visit: Payer: Self-pay | Admitting: Medical Genetics

## 2023-07-21 DIAGNOSIS — Z006 Encounter for examination for normal comparison and control in clinical research program: Secondary | ICD-10-CM

## 2023-08-06 ENCOUNTER — Encounter: Payer: Self-pay | Admitting: Family Medicine

## 2023-08-07 ENCOUNTER — Other Ambulatory Visit: Payer: Self-pay | Admitting: Family Medicine

## 2023-08-07 DIAGNOSIS — E1169 Type 2 diabetes mellitus with other specified complication: Secondary | ICD-10-CM

## 2023-08-07 MED ORDER — OZEMPIC (0.25 OR 0.5 MG/DOSE) 2 MG/3ML ~~LOC~~ SOPN: 0.2500 mg | PEN_INJECTOR | SUBCUTANEOUS | 0 refills | Status: DC

## 2023-08-15 ENCOUNTER — Ambulatory Visit: Payer: Self-pay | Admitting: *Deleted

## 2023-08-15 NOTE — Telephone Encounter (Signed)
Summary: cough / rx req   The patient would like to be prescribed Tussionex for a cough they've experienced for roughly three days  Please contact further when possible  ----- Message from [facility] sent at 08/15/2023  2:18 PM EDT ----- The patient would like to be prescribed cough syrup for their cough they've experienced for roughly three days  Please contact further when possible          Chief Complaint: Cough Symptoms: Productive cough, slight amt, greenish, runny nose, SOB (Asthma) Frequency: Saturday Pertinent Negatives: Patient denies fever Disposition: [] ED /[] Urgent Care (no appt availability in office) / [] Appointment(In office/virtual)/ []  [facility]/ [] Home Care/ [] Refused Recommended Disposition /[] [facility]/ [x]  Follow-up with PCP Additional Notes:  Pt went to [facility] today. States was given prednisone, ATBs, but they could not prescribe Tussionex ,as it is controlled. States "I always get this, Tessalon does not help."  [facility] [place] report available in Epic. Please advise. Reason for Disposition  SEVERE coughing spells (e.g., whooping sound after coughing, vomiting after coughing)  Answer Assessment - Initial Assessment Questions 1. ONSET: "When did the cough begin?"      Saturday 2. SEVERITY: "How bad is the cough today?"      Awake at night 3. SPUTUM: "Describe the color of your sputum" (none, dry cough; clear, white, yellow, green)     Greenish, scant amounts 4. HEMOPTYSIS: "Are you coughing up any blood?" If so ask: "How much?" (flecks, streaks, tablespoons, etc.)     Flecks 5. DIFFICULTY BREATHING: "Are you having difficulty breathing?" If Yes, ask: "How bad is it?" (e.g., mild, moderate, severe)    - MILD: No SOB at rest, mild SOB with walking, speaks normally in sentences, can lie down, no retractions, pulse < 100.    - MODERATE: SOB at rest, SOB with minimal exertion and prefers  to sit, cannot lie down flat, speaks in phrases, mild retractions, audible wheezing, pulse 100-120.    - SEVERE: Very SOB at rest, speaks in single words, struggling to breathe, sitting hunched forward, retractions, pulse > 120      SOB (Asthma) 6. FEVER: "Do you have a fever?" If Yes, ask: "What is your temperature, how was it measured, and when did it start?"     No 7. CARDIAC HISTORY: "Do you have any history of heart disease?" (e.g., heart attack, congestive heart failure)       8. LUNG HISTORY: "Do you have any history of lung disease?"  (e.g., pulmonary embolus, asthma, emphysema)     Asthma 9. PE RISK FACTORS: "Do you have a history of blood clots?" (or: recent major surgery, recent prolonged travel, bedridden)      10. OTHER SYMPTOMS: "Do you have any other symptoms?" (e.g., runny nose, wheezing, chest pain)       Runny nose  Protocols used: Cough - Acute Productive-A-AH

## 2023-08-16 ENCOUNTER — Telehealth: Payer: 59 | Admitting: Family Medicine

## 2023-08-16 NOTE — Telephone Encounter (Signed)
Spoke with pt and informed her that she would have to be seen in order to get medication. [facility] stated [facility] and [facility] not able to prescribe that particular medication. I again informed her of what Denny Peon stated and she said just for get it and hung up

## 2023-08-26 ENCOUNTER — Other Ambulatory Visit: Payer: Self-pay | Admitting: Family Medicine

## 2023-08-26 DIAGNOSIS — E1169 Type 2 diabetes mellitus with other specified complication: Secondary | ICD-10-CM

## 2023-08-28 ENCOUNTER — Encounter: Payer: Self-pay | Admitting: Family Medicine

## 2023-08-28 DIAGNOSIS — G2581 Restless legs syndrome: Secondary | ICD-10-CM

## 2023-08-30 MED ORDER — OZEMPIC (0.25 OR 0.5 MG/DOSE) 2 MG/3ML ~~LOC~~ SOPN
0.5000 mg | PEN_INJECTOR | SUBCUTANEOUS | 0 refills | Status: DC
Start: 2023-08-30 — End: 2023-10-12

## 2023-09-21 ENCOUNTER — Other Ambulatory Visit: Payer: Self-pay | Admitting: Family Medicine

## 2023-09-21 DIAGNOSIS — G2581 Restless legs syndrome: Secondary | ICD-10-CM

## 2023-10-06 ENCOUNTER — Ambulatory Visit: Payer: 59 | Admitting: Nurse Practitioner

## 2023-10-12 ENCOUNTER — Other Ambulatory Visit: Payer: Self-pay | Admitting: Family Medicine

## 2023-10-12 DIAGNOSIS — E1169 Type 2 diabetes mellitus with other specified complication: Secondary | ICD-10-CM

## 2023-10-13 MED ORDER — OZEMPIC (0.25 OR 0.5 MG/DOSE) 2 MG/3ML ~~LOC~~ SOPN
0.5000 mg | PEN_INJECTOR | SUBCUTANEOUS | 0 refills | Status: DC
Start: 2023-10-13 — End: 2023-11-16
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
## 2023-11-07 ENCOUNTER — Other Ambulatory Visit: Payer: Self-pay | Admitting: Family Medicine

## 2023-11-07 DIAGNOSIS — E785 Hyperlipidemia, unspecified: Secondary | ICD-10-CM

## 2023-11-14 ENCOUNTER — Ambulatory Visit: Payer: 59 | Admitting: Family Medicine

## 2023-11-16 ENCOUNTER — Ambulatory Visit: Payer: 59 | Admitting: Family Medicine

## 2023-11-16 ENCOUNTER — Encounter: Payer: Self-pay | Admitting: Family Medicine

## 2023-11-16 VITALS — BP 124/70 | HR 92 | Temp 98.1°F | Resp 16 | Ht 66.0 in | Wt 185.8 lb

## 2023-11-16 DIAGNOSIS — E785 Hyperlipidemia, unspecified: Secondary | ICD-10-CM

## 2023-11-16 DIAGNOSIS — J4489 Other specified chronic obstructive pulmonary disease: Secondary | ICD-10-CM

## 2023-11-16 DIAGNOSIS — G2581 Restless legs syndrome: Secondary | ICD-10-CM

## 2023-11-16 DIAGNOSIS — Z7985 Long-term (current) use of injectable non-insulin antidiabetic drugs: Secondary | ICD-10-CM

## 2023-11-16 DIAGNOSIS — F5105 Insomnia due to other mental disorder: Secondary | ICD-10-CM

## 2023-11-16 DIAGNOSIS — F339 Major depressive disorder, recurrent, unspecified: Secondary | ICD-10-CM | POA: Diagnosis not present

## 2023-11-16 DIAGNOSIS — E1159 Type 2 diabetes mellitus with other circulatory complications: Secondary | ICD-10-CM | POA: Diagnosis not present

## 2023-11-16 DIAGNOSIS — G43009 Migraine without aura, not intractable, without status migrainosus: Secondary | ICD-10-CM

## 2023-11-16 DIAGNOSIS — F99 Mental disorder, not otherwise specified: Secondary | ICD-10-CM

## 2023-11-16 DIAGNOSIS — E1169 Type 2 diabetes mellitus with other specified complication: Secondary | ICD-10-CM | POA: Diagnosis not present

## 2023-11-16 DIAGNOSIS — I152 Hypertension secondary to endocrine disorders: Secondary | ICD-10-CM

## 2023-11-16 DIAGNOSIS — F411 Generalized anxiety disorder: Secondary | ICD-10-CM

## 2023-11-16 DIAGNOSIS — Z23 Encounter for immunization: Secondary | ICD-10-CM

## 2023-11-16 LAB — POCT GLYCOSYLATED HEMOGLOBIN (HGB A1C): Hemoglobin A1C: 6.4 % — AB (ref 4.0–5.6)

## 2023-11-16 MED ORDER — ESCITALOPRAM OXALATE 20 MG PO TABS: 20.0000 mg | ORAL_TABLET | Freq: Every day | ORAL | 1 refills | Status: DC

## 2023-11-16 MED ORDER — BREZTRI AEROSPHERE 160-9-4.8 MCG/ACT IN AERO: 2.0000 | INHALATION_SPRAY | Freq: Two times a day (BID) | RESPIRATORY_TRACT | 5 refills | Status: DC

## 2023-11-16 MED ORDER — ALPRAZOLAM 0.5 MG PO TABS: 0.5000 mg | ORAL_TABLET | Freq: Every day | ORAL | 0 refills | Status: DC | PRN

## 2023-11-16 MED ORDER — LOSARTAN POTASSIUM 25 MG PO TABS
25.0000 mg | ORAL_TABLET | Freq: Every day | ORAL | 1 refills | Status: DC
Start: 2023-11-16 — End: 2024-04-10

## 2023-11-16 MED ORDER — ROPINIROLE HCL 1 MG PO TABS
1.0000 mg | ORAL_TABLET | Freq: Two times a day (BID) | ORAL | 1 refills | Status: DC
Start: 2023-11-16 — End: 2024-04-10

## 2023-11-16 MED ORDER — QUETIAPINE FUMARATE 25 MG PO TABS
25.0000 mg | ORAL_TABLET | Freq: Every day | ORAL | 1 refills | Status: DC
Start: 2023-11-16 — End: 2024-04-10

## 2023-11-16 MED ORDER — BUSPIRONE HCL 5 MG PO TABS: 5.0000 mg | ORAL_TABLET | Freq: Every day | ORAL | 1 refills | Status: DC | PRN

## 2023-11-16 MED ORDER — SEMAGLUTIDE (1 MG/DOSE) 4 MG/3ML ~~LOC~~ SOPN
1.0000 mg | PEN_INJECTOR | SUBCUTANEOUS | 1 refills | Status: DC
Start: 2023-11-16 — End: 2024-04-10

## 2024-01-27 ENCOUNTER — Other Ambulatory Visit: Payer: Self-pay | Admitting: Family Medicine

## 2024-01-27 DIAGNOSIS — E559 Vitamin D deficiency, unspecified: Secondary | ICD-10-CM

## 2024-01-29 NOTE — Telephone Encounter (Signed)
 Last lab 06/2023-normal range

## 2024-04-04 ENCOUNTER — Ambulatory Visit: Payer: Self-pay

## 2024-04-04 ENCOUNTER — Ambulatory Visit (INDEPENDENT_AMBULATORY_CARE_PROVIDER_SITE_OTHER): Payer: Self-pay | Admitting: Family Medicine

## 2024-04-04 ENCOUNTER — Encounter: Payer: Self-pay | Admitting: Family Medicine

## 2024-04-04 VITALS — BP 126/76 | HR 99 | Resp 18 | Ht 66.0 in | Wt 172.4 lb

## 2024-04-04 DIAGNOSIS — J441 Chronic obstructive pulmonary disease with (acute) exacerbation: Secondary | ICD-10-CM

## 2024-04-04 DIAGNOSIS — E118 Type 2 diabetes mellitus with unspecified complications: Secondary | ICD-10-CM

## 2024-04-04 MED ORDER — METHYLPREDNISOLONE 4 MG PO TBPK: ORAL_TABLET | ORAL | 0 refills | Status: DC

## 2024-04-04 MED ORDER — HYDROCOD POLI-CHLORPHE POLI ER 10-8 MG/5ML PO SUER: 5.0000 mL | Freq: Two times a day (BID) | ORAL | 0 refills | Status: DC | PRN

## 2024-04-04 NOTE — Telephone Encounter (Signed)
Appointment scheduled for 11/20 

## 2024-04-04 NOTE — Progress Notes (Signed)
 Name: Destiny Atkinson   MRN: [id number]    DOB: 06/20/1976   Date:04/04/2024       Progress Note  Subjective  Chief Complaint  Chief Complaint  Patient presents with   Cough    About 4 days   Nasal Congestion   Shortness of Breath    Shortness of Breath--worse when laying down or on exertion, Dry cough    Discussed the use of AI scribe software for clinical note transcription with the patient, who gave verbal consent to proceed.  History of Present Illness Destiny Atkinson is a 48 year old female with asthma and COPD who presents with respiratory symptoms following a recent illness.  Four days ago, she developed sneezing and a runny nose after returning from a cruise in [place] , initially attributing these symptoms to allergies. Her symptoms progressed to significant nasal drainage, coughing, and chest congestion.  Her cough has worsened, accompanied by difficulty breathing, wheezing, and a rattling sensation in her chest. Deep breaths exacerbate her cough, and she has been unable to sleep due to muscle pain in her back and chest.  Her current medications for asthma include Breztri  and a rescue inhaler as needed. She also uses a nebulizer during bronchitis episodes. She has not been using antibiotics recently, as she typically uses a prednisone  taper for flare-ups, which she finds effective.  She monitors her oxygen levels with a watch and notes her pulse is 96. She works from home, which allows her some flexibility in managing her symptoms during the day. She smokes and denies fever.    Patient Active Problem List   Diagnosis Date Noted   Hypertension associated with type 2 diabetes mellitus (HCC) 10/26/2021   Moderate major depression (HCC) 10/26/2021   Vitamin D  deficiency 12/28/2016   Mild major depression (HCC) 11/17/2015   GAD (generalized anxiety disorder) 05/06/2015   Moderate persistent asthma with allergic rhinitis without complication 05/06/2015   Mobitz type I  incomplete atrioventricular block 05/06/2015   B12 deficiency 05/06/2015   Essential (primary) hypertension 05/06/2015   Irregular bleeding 05/06/2015   Migraine without aura and responsive to treatment 05/06/2015   BMI 31.0-31.9,adult 05/06/2015   Peripheral neuropathy 05/06/2015   Perennial allergic rhinitis 05/06/2015   Restless leg 05/06/2015   Tobacco abuse 05/06/2015   Chronic bronchiolitis (HCC) 07/19/2002    Social History   Tobacco Use   Smoking status: Every Day    Current packs/day: 0.25    Average packs/day: 0.3 packs/day for 28.8 years (7.2 ttl pk-yrs)    Types: Cigarettes, E-cigarettes    Start date: 06/29/1995   Smokeless tobacco: Never  Substance Use Topics   Alcohol use: Yes    Alcohol/week: 0.0 standard drinks of alcohol    Comment: rarely     Current Outpatient Medications:    albuterol  (VENTOLIN  HFA) 108 (90 Base) MCG/ACT inhaler, USE 2 INHALATIONS EVERY 6 HOURS AS NEEDED FOR WHEEZING OR SHORTNESS OF BREATH, Disp: 17 g, Rfl: 4   ALPRAZolam  (XANAX ) 0.5 MG tablet, Take 1 tablet (0.5 mg total) by mouth daily as needed for anxiety., Disp: 20 tablet, Rfl: 0   B Complex Vitamins (VITAMIN B-COMPLEX) TABS, Take 1 tablet by mouth daily., Disp: , Rfl:    Budeson-Glycopyrrol-Formoterol  (BREZTRI  AEROSPHERE) 160-9-4.8 MCG/ACT AERO, Inhale 2 puffs into the lungs in the morning and at bedtime., Disp: 1 each, Rfl: 5   busPIRone  (BUSPAR ) 5 MG tablet, Take 1 tablet (5 mg total) by mouth daily as needed., Disp: 90 tablet, Rfl: 1  chlorpheniramine-HYDROcodone (TUSSIONEX) 10-8 MG/5ML, Take 5 mLs by mouth every 12 (twelve) hours as needed for cough., Disp: 115 mL, Rfl: 0   cyanocobalamin  (VITAMIN B12) 500 MCG tablet, Take 500 mcg by mouth daily., Disp: , Rfl:    escitalopram  (LEXAPRO ) 20 MG tablet, Take 1 tablet (20 mg total) by mouth daily., Disp: 90 tablet, Rfl: 1   ipratropium-albuterol  (DUONEB) 0.5-2.5 (3) MG/3ML SOLN, Take 3 mLs by nebulization 3 (three) times daily as needed  (wheeze, coughing fits, chest tightness, SOB)., Disp: 180 mL, Rfl: 2   levocetirizine (XYZAL ) 5 MG tablet, Take 1 tablet (5 mg total) by mouth daily., Disp: 90 tablet, Rfl: 1   levonorgestrel  (LILETTA ) 20.1 MCG/DAY IUD IUD, 1 each by Intrauterine route once., Disp: , Rfl:    loratadine (CLARITIN) 10 MG tablet, Take 10 mg by mouth daily., Disp: , Rfl:    losartan  (COZAAR ) 25 MG tablet, Take 1 tablet (25 mg total) by mouth daily., Disp: 90 tablet, Rfl: 1   methylPREDNISolone  (MEDROL  DOSEPAK) 4 MG TBPK tablet, Take by mouth as directed., Disp: 21 tablet, Rfl: 0   QUEtiapine  (SEROQUEL ) 25 MG tablet, Take 1 tablet (25 mg total) by mouth at bedtime., Disp: 90 tablet, Rfl: 1   rizatriptan  (MAXALT -MLT) 10 MG disintegrating tablet, Take 1 tablet (10 mg total) by mouth as needed for migraine. May repeat in 2 hours if needed, Disp: 27 tablet, Rfl: 0   rOPINIRole  (REQUIP ) 1 MG tablet, Take 1 tablet (1 mg total) by mouth 2 (two) times daily., Disp: 180 tablet, Rfl: 1   Semaglutide , 1 MG/DOSE, 4 MG/3ML SOPN, Inject 1 mg as directed once a week., Disp: 9 mL, Rfl: 1   Vitamin D , Ergocalciferol , (DRISDOL ) 1.25 MG (50000 UNIT) CAPS capsule, TAKE 1 CAPSULE (50,000 UNITS TOTAL) BY MOUTH EVERY 7 (SEVEN) DAYS, Disp: 12 capsule, Rfl: 0  Allergies  Allergen Reactions   Dog Epithelium     Anaphylaxis with Rabbits.  Dogs, cats, birds cause swelling, extreme itching.   Dust Mite Extract    Pollen Extract    Soap    Tape    Tree Extract     ROS  Ten systems reviewed and is negative except as mentioned in HPI    Objective  Vitals:   04/04/24 1132  BP: 126/76  Pulse: 99  Resp: 18  SpO2: 96%  Weight: 172 lb 6.4 oz (78.2 kg)  Height: 5\' 6"  (1.676 m)    Body mass index is 27.83 kg/m.  Physical Exam  CONSTITUTIONAL: Patient appears well-developed and well-nourished. No distress. HEENT: Head atraumatic, normocephalic, neck supple. CARDIOVASCULAR: Normal rate, regular rhythm and normal heart sounds. No  murmur heard. No BLE edema. PULMONARY: Inspiratory and Expiratory wheezing No respiratory distress. PSYCHIATRIC: Patient has a normal mood and affect. Behavior is normal. Judgment and thought content normal.  Assessment & Plan COPD with asthma exacerbation Acute exacerbation likely triggered by viral infection. Symptoms include increased coughing, wheezing, shortness of breath, and chest discomfort. Prednisone  indicated. Antibiotics not indicated unless symptoms worsen. Cough syrup for symptomatic relief. Prednisone  may cause hyperglycemia. - Prescribe prednisone  with tapering dose. - Prescribe cough syrup, Tussionex , 115 mL, twice daily as needed, caution for drowsiness. - Advise increased water intake and reduced carbohydrate consumption to manage potential hyperglycemia. - Monitor oxygen levels, seek medical attention if symptoms worsen. - Avoid antibiotics unless symptoms worsen.  Type 2 diabetes mellitus Well-managed. Prednisone  may cause hyperglycemia, requiring dietary adjustments. - Advise increased water intake and reduced carbohydrate consumption to manage  potential hyperglycemia from prednisone .

## 2024-04-04 NOTE — Progress Notes (Deleted)
 Name: Destiny Atkinson   MRN: [id number]    DOB: 01-19-1976   Date:04/04/2024       Progress Note  Subjective  Chief Complaint  Chief Complaint  Patient presents with   Cough    About 4 days   Nasal Congestion   Shortness of Breath    Shortness of Breath--worse when laying down or on exertion, Dry cough    {HPI:32069}  Patient Active Problem List   Diagnosis Date Noted   Hypertension associated with type 2 diabetes mellitus (HCC) 10/26/2021   Moderate major depression (HCC) 10/26/2021   Vitamin D  deficiency 12/28/2016   Mild major depression (HCC) 11/17/2015   GAD (generalized anxiety disorder) 05/06/2015   Moderate persistent asthma with allergic rhinitis without complication 05/06/2015   Mobitz type I incomplete atrioventricular block 05/06/2015   B12 deficiency 05/06/2015   Essential (primary) hypertension 05/06/2015   Irregular bleeding 05/06/2015   Migraine without aura and responsive to treatment 05/06/2015   BMI 31.0-31.9,adult 05/06/2015   Peripheral neuropathy 05/06/2015   Perennial allergic rhinitis 05/06/2015   Restless leg 05/06/2015   Tobacco abuse 05/06/2015   Chronic bronchiolitis (HCC) 07/19/2002    Social History   Tobacco Use   Smoking status: Every Day    Current packs/day: 0.25    Average packs/day: 0.3 packs/day for 28.8 years (7.2 ttl pk-yrs)    Types: Cigarettes, E-cigarettes    Start date: 06/29/1995   Smokeless tobacco: Never  Substance Use Topics   Alcohol use: Yes    Alcohol/week: 0.0 standard drinks of alcohol    Comment: rarely     Current Outpatient Medications:    albuterol  (VENTOLIN  HFA) 108 (90 Base) MCG/ACT inhaler, USE 2 INHALATIONS EVERY 6 HOURS AS NEEDED FOR WHEEZING OR SHORTNESS OF BREATH, Disp: 17 g, Rfl: 4   ALPRAZolam  (XANAX ) 0.5 MG tablet, Take 1 tablet (0.5 mg total) by mouth daily as needed for anxiety., Disp: 20 tablet, Rfl: 0   B Complex Vitamins (VITAMIN B-COMPLEX) TABS, Take 1 tablet by mouth daily., Disp: , Rfl:     Budeson-Glycopyrrol-Formoterol  (BREZTRI  AEROSPHERE) 160-9-4.8 MCG/ACT AERO, Inhale 2 puffs into the lungs in the morning and at bedtime., Disp: 1 each, Rfl: 5   busPIRone  (BUSPAR ) 5 MG tablet, Take 1 tablet (5 mg total) by mouth daily as needed., Disp: 90 tablet, Rfl: 1   cyanocobalamin  (VITAMIN B12) 500 MCG tablet, Take 500 mcg by mouth daily., Disp: , Rfl:    escitalopram  (LEXAPRO ) 20 MG tablet, Take 1 tablet (20 mg total) by mouth daily., Disp: 90 tablet, Rfl: 1   ipratropium-albuterol  (DUONEB) 0.5-2.5 (3) MG/3ML SOLN, Take 3 mLs by nebulization 3 (three) times daily as needed (wheeze, coughing fits, chest tightness, SOB)., Disp: 180 mL, Rfl: 2   levocetirizine (XYZAL ) 5 MG tablet, Take 1 tablet (5 mg total) by mouth daily., Disp: 90 tablet, Rfl: 1   levonorgestrel  (LILETTA ) 20.1 MCG/DAY IUD IUD, 1 each by Intrauterine route once., Disp: , Rfl:    loratadine (CLARITIN) 10 MG tablet, Take 10 mg by mouth daily., Disp: , Rfl:    losartan  (COZAAR ) 25 MG tablet, Take 1 tablet (25 mg total) by mouth daily., Disp: 90 tablet, Rfl: 1   QUEtiapine  (SEROQUEL ) 25 MG tablet, Take 1 tablet (25 mg total) by mouth at bedtime., Disp: 90 tablet, Rfl: 1   rizatriptan  (MAXALT -MLT) 10 MG disintegrating tablet, Take 1 tablet (10 mg total) by mouth as needed for migraine. May repeat in 2 hours if needed, Disp: 27 tablet, Rfl:  0   rOPINIRole  (REQUIP ) 1 MG tablet, Take 1 tablet (1 mg total) by mouth 2 (two) times daily., Disp: 180 tablet, Rfl: 1   Semaglutide , 1 MG/DOSE, 4 MG/3ML SOPN, Inject 1 mg as directed once a week., Disp: 9 mL, Rfl: 1   Vitamin D , Ergocalciferol , (DRISDOL ) 1.25 MG (50000 UNIT) CAPS capsule, TAKE 1 CAPSULE (50,000 UNITS TOTAL) BY MOUTH EVERY 7 (SEVEN) DAYS, Disp: 12 capsule, Rfl: 0  Allergies  Allergen Reactions   Dog Epithelium     Anaphylaxis with Rabbits.  Dogs, cats, birds cause swelling, extreme itching.   Dust Mite Extract    Pollen Extract    Soap    Tape    Tree Extract      ROS  ***  Objective  Vitals:   04/04/24 1132  BP: 126/76  Pulse: 99  Resp: 18  SpO2: 96%  Weight: 172 lb 6.4 oz (78.2 kg)  Height: 5\' 6"  (1.676 m)    Body mass index is 27.83 kg/m.    Physical Exam   No results found for this or any previous visit (from the past 2160 hours).   {A&P:32071}  There are no diagnoses linked to this encounter.

## 2024-04-04 NOTE — Telephone Encounter (Signed)
 Copied from CRM [id number]. Topic: Clinical - Red Word Triage >> Apr 04, 2024  8:24 AM El Gravely T wrote: [facility] that prompted transfer to Nurse Triage: Shortness of breathe, cough, difficulty breathing, asthma, tightness in chest    Chief Complaint: Shortness of Breath--worse when laying down or on exertion, Dry cough  Symptoms: short of breath, chest tightness, dry cough, runny nose,  Frequency: 1-4 days Pertinent Negatives: Patient denies fever, nausea, vomiting, diarrhea. Disposition: [] ED /[] Urgent Care (no appt availability in office) / [x] Appointment(In office/virtual)/ []  [facility]/ [] Home Care/ [] Refused Recommended Disposition /[] [facility]/ []  Follow-up with PCP Additional Notes: Patient called and advised that she has been feeling bad for about 4 days now. Patient denies any fevers, nausea, vomiting, diarrhea. Coughing started two days ago. Sneezing and runny nose started 4 days ago. Patient also had back pain and headache--both she states from coughing a lot. Patient states that she gets like this every year and she has an inhaler and usually feels better after using it. Appointment made for today 04/04/2024 at  11:20 am with her PCP.  Patient is advised that if anything worsens to go to the Emergency Room or call 911 for an ambulance to take her to the Emergency Room. Patient verbalized understanding.  Reason for Disposition  [1] MILD difficulty breathing (e.g., minimal/no SOB at rest, SOB with walking, pulse <100) AND [2] NEW-onset or WORSE than normal  Answer Assessment - Initial Assessment Questions 1. RESPIRATORY STATUS: "Describe your breathing?" (e.g., wheezing, shortness of breath, unable to speak, severe coughing)      Shortness of breath and tightness in the chest 2. ONSET: "When did this breathing problem begin?"      Last night and worse this morning 3. PATTERN "Does the difficult breathing come and go, or has it been constant since it  started?"      More coughing when laying down 4. SEVERITY: "How bad is your breathing?" (e.g., mild, moderate, severe)    - MILD: No SOB at rest, mild SOB with walking, speaks normally in sentences, can lie down, no retractions, pulse < 100.    - MODERATE: SOB at rest, SOB with minimal exertion and prefers to sit, cannot lie down flat, speaks in phrases, mild retractions, audible wheezing, pulse 100-120.    - SEVERE: Very SOB at rest, speaks in single words, struggling to breathe, sitting hunched forward, retractions, pulse > 120      "All the time" 5. RECURRENT SYMPTOM: "Have you had difficulty breathing before?" If Yes, ask: "When was the last time?" and "What happened that time?"      "Every time I start getting bronchitis" 6. CARDIAC HISTORY: "Do you have any history of heart disease?" (e.g., heart attack, angina, bypass surgery, angioplasty)      ------ 7. LUNG HISTORY: "Do you have any history of lung disease?"  (e.g., pulmonary embolus, asthma, emphysema)     Asthma, chronic bronchitis 8. CAUSE: "What do you think is causing the breathing problem?"       9. OTHER SYMPTOMS: "Do you have any other symptoms? (e.g., dizziness, runny nose, cough, chest pain, fever)     Dry cough, chest tightness,  10. O2 SATURATION MONITOR:  "Do you use an oxygen saturation monitor (pulse oximeter) at home?" If Yes, ask: "What is your reading (oxygen level) today?" "What is your usual oxygen saturation reading?" (e.g., 95%)       96% based on watch 11. PREGNANCY: "Is there any chance  you are pregnant?" "When was your last menstrual period?"       No  IUD 12. TRAVEL: "Have you traveled out of the country in the last month?" (e.g., travel history, exposures)       Out of state and to the [place] this past weekend  Protocols used: Breathing Difficulty-A-AH

## 2024-04-08 ENCOUNTER — Encounter: Payer: Self-pay | Admitting: Family Medicine

## 2024-04-08 ENCOUNTER — Other Ambulatory Visit: Payer: Self-pay | Admitting: Family Medicine

## 2024-04-08 MED ORDER — AMOXICILLIN-POT CLAVULANATE 875-125 MG PO TABS: 1.0000 | ORAL_TABLET | Freq: Two times a day (BID) | ORAL | 0 refills | Status: DC

## 2024-04-10 ENCOUNTER — Encounter: Payer: Self-pay | Admitting: Family Medicine

## 2024-04-10 ENCOUNTER — Ambulatory Visit: Payer: Self-pay | Admitting: Family Medicine

## 2024-04-10 VITALS — BP 124/72 | HR 94 | Temp 98.1°F | Resp 16 | Ht 66.0 in | Wt 170.7 lb

## 2024-04-10 DIAGNOSIS — F339 Major depressive disorder, recurrent, unspecified: Secondary | ICD-10-CM

## 2024-04-10 DIAGNOSIS — E1159 Type 2 diabetes mellitus with other circulatory complications: Secondary | ICD-10-CM

## 2024-04-10 DIAGNOSIS — J4489 Other specified chronic obstructive pulmonary disease: Secondary | ICD-10-CM

## 2024-04-10 DIAGNOSIS — Z1231 Encounter for screening mammogram for malignant neoplasm of breast: Secondary | ICD-10-CM

## 2024-04-10 DIAGNOSIS — E785 Hyperlipidemia, unspecified: Secondary | ICD-10-CM

## 2024-04-10 DIAGNOSIS — E1169 Type 2 diabetes mellitus with other specified complication: Secondary | ICD-10-CM | POA: Diagnosis not present

## 2024-04-10 DIAGNOSIS — E559 Vitamin D deficiency, unspecified: Secondary | ICD-10-CM

## 2024-04-10 DIAGNOSIS — I152 Hypertension secondary to endocrine disorders: Secondary | ICD-10-CM

## 2024-04-10 DIAGNOSIS — F411 Generalized anxiety disorder: Secondary | ICD-10-CM

## 2024-04-10 DIAGNOSIS — F5105 Insomnia due to other mental disorder: Secondary | ICD-10-CM

## 2024-04-10 DIAGNOSIS — E118 Type 2 diabetes mellitus with unspecified complications: Secondary | ICD-10-CM

## 2024-04-10 DIAGNOSIS — G2581 Restless legs syndrome: Secondary | ICD-10-CM

## 2024-04-10 DIAGNOSIS — Z1211 Encounter for screening for malignant neoplasm of colon: Secondary | ICD-10-CM

## 2024-04-10 DIAGNOSIS — G43009 Migraine without aura, not intractable, without status migrainosus: Secondary | ICD-10-CM

## 2024-04-10 LAB — POCT GLYCOSYLATED HEMOGLOBIN (HGB A1C): Hemoglobin A1C: 5.8 % — AB (ref 4.0–5.6)

## 2024-04-10 MED ORDER — VITAMIN D (ERGOCALCIFEROL) 1.25 MG (50000 UNIT) PO CAPS: 50000.0000 [IU] | ORAL_CAPSULE | ORAL | 1 refills | Status: DC

## 2024-04-10 MED ORDER — SEMAGLUTIDE (1 MG/DOSE) 4 MG/3ML ~~LOC~~ SOPN: 1.0000 mg | PEN_INJECTOR | SUBCUTANEOUS | 1 refills | Status: DC

## 2024-04-10 MED ORDER — ROPINIROLE HCL 1 MG PO TABS: 1.0000 mg | ORAL_TABLET | Freq: Two times a day (BID) | ORAL | 1 refills | Status: DC

## 2024-04-10 MED ORDER — BUSPIRONE HCL 5 MG PO TABS: 5.0000 mg | ORAL_TABLET | Freq: Every day | ORAL | 1 refills | Status: DC | PRN

## 2024-04-10 MED ORDER — ALPRAZOLAM 0.5 MG PO TABS: 0.5000 mg | ORAL_TABLET | Freq: Every day | ORAL | 0 refills | Status: DC | PRN

## 2024-04-10 MED ORDER — RIZATRIPTAN BENZOATE 10 MG PO TBDP
10.0000 mg | ORAL_TABLET | ORAL | 0 refills | Status: DC | PRN
Start: 2024-04-10 — End: 2024-09-10

## 2024-04-10 MED ORDER — ESCITALOPRAM OXALATE 20 MG PO TABS: 20.0000 mg | ORAL_TABLET | Freq: Every day | ORAL | 1 refills | Status: DC

## 2024-04-10 MED ORDER — LOSARTAN POTASSIUM 25 MG PO TABS: 25.0000 mg | ORAL_TABLET | Freq: Every day | ORAL | 1 refills | Status: DC

## 2024-04-10 MED ORDER — BREZTRI AEROSPHERE 160-9-4.8 MCG/ACT IN AERO
2.0000 | INHALATION_SPRAY | Freq: Two times a day (BID) | RESPIRATORY_TRACT | 5 refills | Status: DC
Start: 2024-04-10 — End: 2024-05-02

## 2024-04-10 MED ORDER — QUETIAPINE FUMARATE 25 MG PO TABS
25.0000 mg | ORAL_TABLET | Freq: Every day | ORAL | 1 refills | Status: DC
Start: 2024-04-10 — End: 2024-09-10

## 2024-04-10 NOTE — Progress Notes (Signed)
 Name: Destiny Atkinson   MRN: [id number]    DOB: [birth year]   Date:04/10/2024       Progress Note  Subjective  Chief Complaint  Chief Complaint  Patient presents with   Medical Management of Chronic Issues   Discussed the use of AI scribe software for clinical note transcription with the patient, who gave verbal consent to proceed.  History of Present Illness Destiny Atkinson is a 48 year old female with COPD, asthma, and diabetes who presents for a regular follow-up visit.  She recently experienced a COPD exacerbation, which has improved following a prednisone  taper. She was prescribed an antibiotic but did not take it as her symptoms improved. Her cough is now productive, and she continues to expectorate mucus. No wheezing is present.  Her diabetes is managed with Ozempic  1 mg since December, effectively lowering her A1c to 5.8%. Her weight has decreased from 185 lbs in December to 170 lbs. No significant side effects from the medication. No increased hunger, thirst, or pain.  Hypertension is managed with losartan  25 mg daily, with no reported side effects. She has a history of dyslipidemia, but is not currently on medication for cholesterol. Her LDL was 132 in August.  She experiences generalized anxiety disorder and major depression, with recent exacerbation due to multiple personal losses, including the passing of her husband's grandmother, a friend, and her dog, as well as her mother's injury. Her GAD-7 score increased from 5 to 14, and her PHQ-9 score from 6 to 12. She uses Buspar  daily and requires a refill, and she has been prescribed Xanax  0.5 mg for anxiety. She also takes Lexapro  20 mg and Seroquel  for sleep, which she finds effective most of the time.  She has a history of migraines, which have been less frequent, with the last episode over a month ago. She uses medication as needed and experiences light sensitivity during episodes.  She takes vitamin D  by prescription, with her  last level at 64. For allergies, she uses both Xyzal  and Claritin, which she finds effective.  She manages restless legs syndrome with medication taken morning and night, which has improved her symptoms without causing daytime drowsiness. Her ferritin levels are good, indicating no iron deficiency.  She is due for a colonoscopy and a mammogram. She has not yet completed a Cologuard test.    Patient Active Problem List   Diagnosis Date Noted   Hypertension associated with type 2 diabetes mellitus (HCC) 10/26/2021   Moderate major depression (HCC) 10/26/2021   Vitamin D  deficiency 12/28/2016   Mild major depression (HCC) 11/17/2015   GAD (generalized anxiety disorder) 05/06/2015   Moderate persistent asthma with allergic rhinitis without complication 05/06/2015   Mobitz type I incomplete atrioventricular block 05/06/2015   B12 deficiency 05/06/2015   Essential (primary) hypertension 05/06/2015   Irregular bleeding 05/06/2015   Migraine without aura and responsive to treatment 05/06/2015   BMI 31.0-31.9,adult 05/06/2015   Peripheral neuropathy 05/06/2015   Perennial allergic rhinitis 05/06/2015   Restless leg 05/06/2015   Tobacco abuse 05/06/2015   Chronic bronchiolitis (HCC) 07/19/2002    Past Surgical History:  Procedure Laterality Date   COLONOSCOPY     in 8th grade, not as adult   ESOPHAGOGASTRODUODENOSCOPY     age 31   NASAL SINUS SURGERY  01/21/2010   septoplasty/ sinus surgery   WISDOM TOOTH EXTRACTION  age 68    Family History  Problem Relation Age of Onset   COPD Mother  Fibromyalgia Mother    Hypertension Mother    Stroke Mother    Asthma Mother    Arthritis Mother    Depression Mother    Heart disease Mother    Crohn's disease Mother    Diabetes Father    Diabetes type II Daughter    Diabetes type II Daughter    Kidney Stones Daughter    Bipolar disorder Maternal Grandmother    Stomach cancer Paternal Grandmother 50   Epilepsy Brother    Ovarian  cancer Other 45   Ovarian cancer Other 50   Breast cancer Neg Hx     Social History   Tobacco Use   Smoking status: Every Day    Current packs/day: 0.25    Average packs/day: 0.3 packs/day for 28.8 years (7.2 ttl pk-yrs)    Types: Cigarettes, E-cigarettes    Start date: 06/29/1995   Smokeless tobacco: Never  Substance Use Topics   Alcohol use: Yes    Alcohol/week: 0.0 standard drinks of alcohol    Comment: rarely     Current Outpatient Medications:    albuterol  (VENTOLIN  HFA) 108 (90 Base) MCG/ACT inhaler, USE 2 INHALATIONS EVERY 6 HOURS AS NEEDED FOR WHEEZING OR SHORTNESS OF BREATH, Disp: 17 g, Rfl: 4   B Complex Vitamins (VITAMIN B-COMPLEX) TABS, Take 1 tablet by mouth daily., Disp: , Rfl:    cyanocobalamin  (VITAMIN B12) 500 MCG tablet, Take 500 mcg by mouth daily., Disp: , Rfl:    ipratropium-albuterol  (DUONEB) 0.5-2.5 (3) MG/3ML SOLN, Take 3 mLs by nebulization 3 (three) times daily as needed (wheeze, coughing fits, chest tightness, SOB)., Disp: 180 mL, Rfl: 2   levocetirizine (XYZAL ) 5 MG tablet, Take 1 tablet (5 mg total) by mouth daily., Disp: 90 tablet, Rfl: 1   levonorgestrel  (LILETTA ) 20.1 MCG/DAY IUD IUD, 1 each by Intrauterine route once., Disp: , Rfl:    loratadine (CLARITIN) 10 MG tablet, Take 10 mg by mouth daily., Disp: , Rfl:    ALPRAZolam  (XANAX ) 0.5 MG tablet, Take 1 tablet (0.5 mg total) by mouth daily as needed for anxiety., Disp: 20 tablet, Rfl: 0   budesonide -glycopyrrolate-formoterol  (BREZTRI  AEROSPHERE) 160-9-4.8 MCG/ACT AERO inhaler, Inhale 2 puffs into the lungs in the morning and at bedtime., Disp: 1 each, Rfl: 5   busPIRone  (BUSPAR ) 5 MG tablet, Take 1 tablet (5 mg total) by mouth daily as needed., Disp: 90 tablet, Rfl: 1   escitalopram  (LEXAPRO ) 20 MG tablet, Take 1 tablet (20 mg total) by mouth daily., Disp: 90 tablet, Rfl: 1   losartan  (COZAAR ) 25 MG tablet, Take 1 tablet (25 mg total) by mouth daily., Disp: 90 tablet, Rfl: 1   QUEtiapine  (SEROQUEL ) 25  MG tablet, Take 1 tablet (25 mg total) by mouth at bedtime., Disp: 90 tablet, Rfl: 1   rizatriptan  (MAXALT -MLT) 10 MG disintegrating tablet, Take 1 tablet (10 mg total) by mouth as needed for migraine. May repeat in 2 hours if needed, Disp: 27 tablet, Rfl: 0   rOPINIRole  (REQUIP ) 1 MG tablet, Take 1 tablet (1 mg total) by mouth 2 (two) times daily., Disp: 180 tablet, Rfl: 1   Semaglutide , 1 MG/DOSE, 4 MG/3ML SOPN, Inject 1 mg as directed once a week., Disp: 9 mL, Rfl: 1   Vitamin D , Ergocalciferol , (DRISDOL ) 1.25 MG (50000 UNIT) CAPS capsule, Take 1 capsule (50,000 Units total) by mouth every 7 (seven) days., Disp: 12 capsule, Rfl: 1  Allergies  Allergen Reactions   Dog Epithelium     Anaphylaxis with Rabbits.  Dogs,  cats, birds cause swelling, extreme itching.   Dust Mite Extract    Pollen Extract    Soap    Tape    Tree Extract     I personally reviewed active problem list, medication list, allergies with the patient/caregiver today.   ROS  Ten systems reviewed and is negative except as mentioned in HPI    Objective Physical Exam  CONSTITUTIONAL: Patient appears well-developed and well-nourished. No distress. HEENT: Head atraumatic, normocephalic, neck supple. CARDIOVASCULAR: Normal rate, regular rhythm and normal heart sounds. No murmur heard. No BLE edema. Extremities normal. PULMONARY: rhonchi on left lung field cleared with a cough ABDOMINAL: There is no tenderness or distention. MUSCULOSKELETAL: Normal gait. Without gross motor or sensory deficit. PSYCHIATRIC: Patient has a normal mood and affect. Behavior is normal. Judgment and thought content normal. NEUROLOGICAL: Sensation intact in feet.  Vitals:   04/10/24 1317  BP: 124/72  Pulse: 94  Resp: 16  Temp: 98.1 F (36.7 C)  TempSrc: Oral  SpO2: 96%  Weight: 170 lb 11.2 oz (77.4 kg)  Height: 5\' 6"  (1.676 m)    Body mass index is 27.55 kg/m.  Recent Results (from the past 2160 hours)  POCT glycosylated  hemoglobin (Hb A1C)     Status: Abnormal   Collection Time: 04/10/24  1:25 PM  Result Value Ref Range   Hemoglobin A1C 5.8 (A) 4.0 - 5.6 %   HbA1c POC (<> result, manual entry)     HbA1c, POC (prediabetic range)     HbA1c, POC (controlled diabetic range)      Diabetic Foot Exam:  Diabetic foot exam was performed with the following findings:   No deformities, ulcerations, or other skin breakdown Normal sensation of 10g monofilament Intact posterior tibialis and dorsalis pedis pulses      PHQ2/9:    04/10/2024    1:19 PM 11/16/2023   11:35 AM 07/13/2023    8:49 AM 01/31/2023    8:16 AM 11/18/2022    1:34 PM  Depression screen PHQ 2/9  Decreased Interest 2 1 0 3 0  Down, Depressed, Hopeless 2 1 0 3 0  PHQ - 2 Score 4 2 0 6 0  Altered sleeping 2 3 2 2  0  Tired, decreased energy 2 0 2 2 0  Change in appetite 2 0 0 2 0  Feeling bad or failure about yourself  0 1 1 1  0  Trouble concentrating 2 0 0 2 0  Moving slowly or fidgety/restless 0 0 0 0 0  Suicidal thoughts 0 0 0 0 0  PHQ-9 Score 12 6 5 15  0  Difficult doing work/chores Somewhat difficult Somewhat difficult Not difficult at all Very difficult     phq 9 is positive     04/10/2024    1:19 PM 11/16/2023   11:35 AM 07/13/2023    8:50 AM 01/31/2023    8:17 AM  GAD 7 : Generalized Anxiety Score  Nervous, Anxious, on Edge 2 1 2 3   Control/stop worrying 2 1 1 3   Worry too much - different things 2 1 1 3   Trouble relaxing 2 1 1 3   Restless 2 1 2 1   Easily annoyed or irritable 2 0 1 2  Afraid - awful might happen 2 0 0 3  Total GAD 7 Score 14 5 8 18   Anxiety Difficulty Very difficult Somewhat difficult Somewhat difficult Very difficult      Fall Risk:    04/10/2024    1:12 PM 04/04/2024  11:09 AM 11/16/2023   11:30 AM 07/13/2023    8:48 AM 01/31/2023    8:12 AM  Fall Risk   Falls in the past year? 0 0 0 0 0  Number falls in past yr: 0 0 0    Injury with Fall? 0 0 0    Risk for fall due to : No Fall Risks No Fall  Risks No Fall Risks No Fall Risks No Fall Risks  Follow up Falls prevention discussed;Education provided;Falls evaluation completed Falls prevention discussed;Education provided;Falls evaluation completed Falls prevention discussed;Education provided;Falls evaluation completed Falls prevention discussed Falls prevention discussed      Assessment & Plan COPD with asthma COPD with asthma exacerbation improved post prednisone  taper. - Use rescue inhaler as needed. - Encourage vocal rest.  Generalized anxiety disorder GAD worsened, GAD-7 score increased from 5 to 14. On Buspar  and Lexapro , requested Xanax  refill. - Prescribe Xanax  0.5 mg as needed. - Refill Buspar  and Lexapro . - Discussed using Buspar  if anxiety disrupts sleep.  Depression major Recurrent  Depression worsened, PHQ-9 score increased from 6 to 12. Prefers no medication change. Discussed grief counseling. - Continue Lexapro . - Consider grief counseling through hospice.  Type 2 diabetes mellitus with hypertension and dyslipidemia Diabetes well-controlled, A1c 5.8% on Ozempic . Hypertension controlled on losartan . Dyslipidemia present, LDL 132, potential improvement with Ozempic . - Continue Ozempic  1 mg. - Continue losartan  25 mg. - Monitor cholesterol in August. - Encourage hydration.  Insomnia Insomnia managed with Seroquel , occasionally affected by anxiety. - Continue Seroquel . - Consider Buspar  if anxiety affects sleep.  Migraine without aura Migraines infrequent, last episode over a month ago, light sensitivity trigger. - Prescribe additional Miochol.  Restless legs syndrome Restless legs syndrome well-managed, no daytime sleepiness. - Continue current medication regimen.  General Health Maintenance Due for colonoscopy and mammogram. Prefers Cologuard, mammogram scheduled at [facility]. - Order Cologuard. - Ensure mammogram scheduled at [facility].  Follow-up Follow-up planned for continuity of care and  monitoring. - Schedule follow-up in five months. - Plan for physical exam and lab work in August.

## 2024-04-19 ENCOUNTER — Ambulatory Visit: Payer: Self-pay | Admitting: Family Medicine

## 2024-04-23 ENCOUNTER — Ambulatory Visit: Payer: Self-pay | Admitting: Family Medicine

## 2024-04-23 LAB — COLOGUARD: COLOGUARD: NEGATIVE

## 2024-04-24 ENCOUNTER — Ambulatory Visit: Payer: Self-pay | Admitting: Family Medicine

## 2024-04-24 ENCOUNTER — Ambulatory Visit: Admitting: Family Medicine

## 2024-04-24 ENCOUNTER — Other Ambulatory Visit: Payer: Self-pay | Admitting: Family Medicine

## 2024-04-24 ENCOUNTER — Encounter: Payer: Self-pay | Admitting: Family Medicine

## 2024-04-24 ENCOUNTER — Ambulatory Visit
Admission: RE | Admit: 2024-04-24 | Discharge: 2024-04-24 | Disposition: A | Discharge status: Home / Self Care | Source: Ambulatory Visit | Attending: Family Medicine | Admitting: Family Medicine

## 2024-04-24 VITALS — BP 104/68 | HR 95 | Resp 16 | Ht 66.0 in | Wt 170.7 lb

## 2024-04-24 DIAGNOSIS — R042 Hemoptysis: Secondary | ICD-10-CM

## 2024-04-24 DIAGNOSIS — J4489 Other specified chronic obstructive pulmonary disease: Secondary | ICD-10-CM

## 2024-04-24 MED ORDER — IOHEXOL 300 MG/ML  SOLN
75.0000 mL | Freq: Once | INTRAMUSCULAR | Status: AC | PRN
  Administered 2024-04-24: 75 mL via INTRAVENOUS

## 2024-04-24 MED ORDER — HYDROCOD POLI-CHLORPHE POLI ER 10-8 MG/5ML PO SUER: 5.0000 mL | Freq: Two times a day (BID) | ORAL | 0 refills | Status: DC | PRN

## 2024-04-24 NOTE — Progress Notes (Signed)
 Name: Destiny Atkinson   MRN: [id number]    DOB: [birth year]   Date:04/24/2024       Progress Note  Subjective  Chief Complaint  Chief Complaint  Patient presents with   Cough    Discussed the use of AI scribe software for clinical note transcription with the patient, who gave verbal consent to proceed.  History of Present Illness Destiny Atkinson is a 48 year old female with asthma and COPD who presents with a persistent dry cough and wheezing.  She has been experiencing a persistent dry cough and wheezing that began after returning from a trip. Initially, the symptoms were mild and similar to the end of a cold. Despite treatment with prednisone  and her regular asthma medication, the symptoms persisted, prompting her to seek care at an urgent care facility. There, she received another round of prednisone  and started antibiotics.  She began taking Augmentin  and completed a 10-day course, finishing two days prior to this visit. Despite these treatments, she continues to experience a dry cough and wheezing. The cough is sometimes accompanied by blood-tinged sputum, described as 'little strings' of blood, similar to frequent nose blowing. This blood-tinged cough began three days ago, near the end of her antibiotic course.  She has a history of similar episodes where persistent cough and wheezing have led to chest scans, which have previously been clear. Her last chest x-ray was in 2023, and she recalls having a CT scan of the abdomen in 2023 as well. These episodes occur every four to five years and are described as 'annoying.'  No mucus production is noted, describing the cough as 'just dry' and 'annoying.' She also experiences wheezing, which she feels is more inspiratory and contributes to her cough. She acknowledges the need to quit smoking, which may be exacerbating her symptoms.    Patient Active Problem List   Diagnosis Date Noted   Hypertension associated with type 2 diabetes mellitus  (HCC) 10/26/2021   Moderate major depression (HCC) 10/26/2021   Vitamin D  deficiency 12/28/2016   Mild major depression (HCC) 11/17/2015   GAD (generalized anxiety disorder) 05/06/2015   Moderate persistent asthma with allergic rhinitis without complication 05/06/2015   Mobitz type I incomplete atrioventricular block 05/06/2015   B12 deficiency 05/06/2015   Essential (primary) hypertension 05/06/2015   Irregular bleeding 05/06/2015   Migraine without aura and responsive to treatment 05/06/2015   BMI 31.0-31.9,adult 05/06/2015   Peripheral neuropathy 05/06/2015   Perennial allergic rhinitis 05/06/2015   Restless leg 05/06/2015   Tobacco abuse 05/06/2015   Chronic bronchiolitis (HCC) 07/19/2002    Social History   Tobacco Use   Smoking status: Every Day    Current packs/day: 0.25    Average packs/day: 0.3 packs/day for 28.8 years (7.2 ttl pk-yrs)    Types: Cigarettes, E-cigarettes    Start date: 06/29/1995   Smokeless tobacco: Never  Substance Use Topics   Alcohol use: Yes    Alcohol/week: 0.0 standard drinks of alcohol    Comment: rarely     Current Outpatient Medications:    albuterol  (VENTOLIN  HFA) 108 (90 Base) MCG/ACT inhaler, USE 2 INHALATIONS EVERY 6 HOURS AS NEEDED FOR WHEEZING OR SHORTNESS OF BREATH, Disp: 17 g, Rfl: 4   ALPRAZolam  (XANAX ) 0.5 MG tablet, Take 1 tablet (0.5 mg total) by mouth daily as needed for anxiety., Disp: 20 tablet, Rfl: 0   B Complex Vitamins (VITAMIN B-COMPLEX) TABS, Take 1 tablet by mouth daily., Disp: , Rfl:    benzonatate  (TESSALON )  100 MG capsule, Take 100 mg by mouth 3 (three) times daily as needed., Disp: , Rfl:    budesonide -glycopyrrolate-formoterol  (BREZTRI  AEROSPHERE) 160-9-4.8 MCG/ACT AERO inhaler, Inhale 2 puffs into the lungs in the morning and at bedtime., Disp: 1 each, Rfl: 5   busPIRone  (BUSPAR ) 5 MG tablet, Take 1 tablet (5 mg total) by mouth daily as needed., Disp: 90 tablet, Rfl: 1   chlorpheniramine-HYDROcodone (TUSSIONEX)  10-8 MG/5ML, Take 5 mLs by mouth every 12 (twelve) hours as needed., Disp: 473 mL, Rfl: 0   cyanocobalamin  (VITAMIN B12) 500 MCG tablet, Take 500 mcg by mouth daily., Disp: , Rfl:    escitalopram  (LEXAPRO ) 20 MG tablet, Take 1 tablet (20 mg total) by mouth daily., Disp: 90 tablet, Rfl: 1   ipratropium-albuterol  (DUONEB) 0.5-2.5 (3) MG/3ML SOLN, Take 3 mLs by nebulization 3 (three) times daily as needed (wheeze, coughing fits, chest tightness, SOB)., Disp: 180 mL, Rfl: 2   levocetirizine (XYZAL ) 5 MG tablet, Take 1 tablet (5 mg total) by mouth daily., Disp: 90 tablet, Rfl: 1   levonorgestrel  (LILETTA ) 20.1 MCG/DAY IUD IUD, 1 each by Intrauterine route once., Disp: , Rfl:    loratadine (CLARITIN) 10 MG tablet, Take 10 mg by mouth daily., Disp: , Rfl:    losartan  (COZAAR ) 25 MG tablet, Take 1 tablet (25 mg total) by mouth daily., Disp: 90 tablet, Rfl: 1   QUEtiapine  (SEROQUEL ) 25 MG tablet, Take 1 tablet (25 mg total) by mouth at bedtime., Disp: 90 tablet, Rfl: 1   rizatriptan  (MAXALT -MLT) 10 MG disintegrating tablet, Take 1 tablet (10 mg total) by mouth as needed for migraine. May repeat in 2 hours if needed, Disp: 27 tablet, Rfl: 0   rOPINIRole  (REQUIP ) 1 MG tablet, Take 1 tablet (1 mg total) by mouth 2 (two) times daily., Disp: 180 tablet, Rfl: 1   Semaglutide , 1 MG/DOSE, 4 MG/3ML SOPN, Inject 1 mg as directed once a week., Disp: 9 mL, Rfl: 1   Vitamin D , Ergocalciferol , (DRISDOL ) 1.25 MG (50000 UNIT) CAPS capsule, Take 1 capsule (50,000 Units total) by mouth every 7 (seven) days., Disp: 12 capsule, Rfl: 1  Allergies  Allergen Reactions   Dog Epithelium     Anaphylaxis with Rabbits.  Dogs, cats, birds cause swelling, extreme itching.   Dust Mite Extract    Pollen Extract    Soap    Tape    Tree Extract     ROS  Ten systems reviewed and is negative except as mentioned in HPI    Objective  Vitals:   04/24/24 1137  BP: 104/68  Pulse: 95  Resp: 16  SpO2: 99%  Weight: 170 lb 11.2 oz  (77.4 kg)  Height: 5\' 6"  (1.676 m)    Body mass index is 27.55 kg/m.  Physical Exam  Constitutional: Patient appears well-developed and well-nourished. Obese  No distress.  HEENT: head atraumatic, normocephalic, pupils equal and reactive to light, neck supple Cardiovascular: Normal rate, regular rhythm and normal heart sounds.  No murmur heard. No BLE edema. Pulmonary/Chest: Effort normal , end expiratory wheezing but not as severe as last visit . No respiratory distress. Abdominal: Soft.  There is no tenderness. Psychiatric: Patient has a normal mood and affect. behavior is normal. Judgment and thought content normal.     Recent Results (from the past 2160 hours)  POCT glycosylated hemoglobin (Hb A1C)     Status: Abnormal   Collection Time: 04/10/24  1:25 PM  Result Value Ref Range   Hemoglobin A1C 5.8 (A)  4.0 - 5.6 %   HbA1c POC (<> result, manual entry)     HbA1c, POC (prediabetic range)     HbA1c, POC (controlled diabetic range)    Cologuard     Status: None   Collection Time: 04/16/24 12:50 PM  Result Value Ref Range   COLOGUARD Negative Negative    Comment: The Cologuard (TM) test was performed on this specimen.  NEGATIVE TEST RESULT. A negative Cologuard result indicates a low likelihood that a colorectal cancer (CRC) or advanced adenoma (adenomatous polyps with more advanced pre-malignant features) is present. The chance that a person with a negative Cologuard test has a colorectal cancer is less than 1 in 1500 (negative predictive value >99.9%) or has an advanced adenoma is less than 5.3% (negative predictive value 94.7%). These data are based on a prospective cross-sectional study of 10,000 individuals at average risk for colorectal cancer who were screened with both Cologuard and colonoscopy. (Imperiale T. et al, N Engl J Med 2014;370(14):1286-1297) The normal value (reference range) for this assay is negative.  COLOGUARD RE-SCREENING RECOMMENDATION: Periodic colorectal  cancer screening is an important part of preventive healthcare for asymptomatic individuals at average risk for colorectal cancer. Following a negative Cologuard  result, the American Cancer Society and U.S. Multi-Society Task Force screening guidelines recommend a Cologuard re-screening interval of 3 years.  References: American Cancer Society Guideline for Colorectal Cancer Screening: https://www.cancer.org/cancer/colon-rectal-cancer/detection-diagnosis-staging/acs-recommendations.html.; Rex DK, Boland CR, Dominitz JK, Colorectal Cancer Screening: Recommendations for Physicians and Patients from the U.S. Multi-Society Task Force on Colorectal Cancer Screening , Am J Gastroenterology 2017; 112:1016-1030.  TEST DESCRIPTION: Composite algorithmic analysis of stool DNA-biomarkers with hemoglobin immunoassay.   Quantitative values of individual biomarkers are not reportable and are not associated with individual biomarker result reference ranges. Cologuard is intended for colorectal cancer screening of adults of either sex, 45 years or older, who are at average-risk for colorectal cancer (CRC). Cologuard has been approved for use by the U.S.  FDA. The performance of Cologuard was established in a cross sectional study of average-risk adults aged 18-84. Cologuard performance in patients ages 49 to 30 years was estimated by sub-group analysis of near-age groups. Colonoscopies performed for a positive result may find as the most clinically significant lesion: colorectal cancer [4.0%], advanced adenoma (including sessile serrated polyps greater than or equal to 1cm diameter) [20%] or non- advanced adenoma [31%]; or no colorectal neoplasia [45%]. These estimates are derived from a prospective cross-sectional screening study of 10,000 individuals at average risk for colorectal cancer who were screened with both Cologuard and colonoscopy. (Imperiale T. et al, Ole [place] J Med 2014;370(14):1286-1297.) Cologuard may produce a  false negative or false positive result (no colorectal cancer or precancerous polyp present at colonoscopy follow up). A negative Cologuard test result does not guarantee the absence of CRC or advanced adenoma  (pre-cancer). The current Cologuard screening interval is every 3 years. Science writer and [place] Therapist, music). Cologuard performance data in a 10,000 patient pivotal study using colonoscopy as the reference method can be accessed at the following location: www.exactlabs.com/results. Additional description of the Cologuard test process, warnings and precautions can be found at www.cologuard.com.       Assessment & Plan Chronic Cough with Hemoptysis Persistent dry cough with wheezing and hemoptysis likely exacerbated by asthma and COPD. Hemoptysis requires further investigation to rule out serious conditions. - Order CT scan of the chest. - Perform Quantiferon Gold test for tuberculosis. - Conduct CBC and comprehensive metabolic panel. - Prescribe  Desenex twice daily, taper as symptoms improve.  Asthma and COPD Asthma and COPD likely contributing to respiratory symptoms post-cold. Managed with asthma medication and prednisone . - Continue asthma medication as previously prescribed.  Smoking Cessation Smoking contributes to respiratory symptoms and chronic conditions. Cessation advised to improve respiratory health. - Advise smoking cessation.

## 2024-04-25 ENCOUNTER — Other Ambulatory Visit: Payer: Self-pay | Admitting: Family Medicine

## 2024-04-25 DIAGNOSIS — R042 Hemoptysis: Secondary | ICD-10-CM

## 2024-04-25 MED ORDER — HYDROCOD POLI-CHLORPHE POLI ER 10-8 MG/5ML PO SUER: 5.0000 mL | Freq: Two times a day (BID) | ORAL | 0 refills | Status: DC | PRN

## 2024-04-27 LAB — COMPREHENSIVE METABOLIC PANEL WITH GFR
AG Ratio: 1.8 (calc) (ref 1.0–2.5)
ALT: 13 U/L (ref 6–29)
AST: 14 U/L (ref 10–35)
Albumin: 4.4 g/dL (ref 3.6–5.1)
Alkaline phosphatase (APISO): 79 U/L (ref 31–125)
BUN: 13 mg/dL (ref 7–25)
CO2: 26 mmol/L (ref 20–32)
Calcium: 9.5 mg/dL (ref 8.6–10.2)
Chloride: 104 mmol/L (ref 98–110)
Creat: 0.84 mg/dL (ref 0.50–0.99)
Globulin: 2.4 g/dL (ref 1.9–3.7)
Glucose, Bld: 84 mg/dL (ref 65–99)
Potassium: 5 mmol/L (ref 3.5–5.3)
Sodium: 139 mmol/L (ref 135–146)
Total Bilirubin: 0.6 mg/dL (ref 0.2–1.2)
Total Protein: 6.8 g/dL (ref 6.1–8.1)
eGFR: 87 mL/min/{1.73_m2} (ref >=60)

## 2024-04-27 LAB — QUANTIFERON-TB GOLD PLUS
Mitogen-NIL: 8.11 [IU]/mL
NIL: 0.03 [IU]/mL
QuantiFERON-TB Gold Plus: NEGATIVE
TB1-NIL: 0.05 [IU]/mL
TB2-NIL: 0 [IU]/mL

## 2024-04-27 LAB — CBC WITH DIFFERENTIAL/PLATELET
Absolute Lymphocytes: 2625 {cells}/uL (ref 850–3900)
Absolute Monocytes: 546 {cells}/uL (ref 200–950)
Basophils Absolute: 42 {cells}/uL (ref 0–200)
Basophils Relative: 0.6 %
Eosinophils Absolute: 462 {cells}/uL (ref 15–500)
Eosinophils Relative: 6.6 %
HCT: 44.9 % (ref 35.0–45.0)
Hemoglobin: 15 g/dL (ref 11.7–15.5)
MCH: 32.5 pg (ref 27.0–33.0)
MCHC: 33.4 g/dL (ref 32.0–36.0)
MCV: 97.4 fL (ref 80.0–100.0)
MPV: 9.5 fL (ref 7.5–12.5)
Monocytes Relative: 7.8 %
Neutro Abs: 3325 {cells}/uL (ref 1500–7800)
Neutrophils Relative %: 47.5 %
Platelets: 260 10*3/uL (ref 140–400)
RBC: 4.61 10*6/uL (ref 3.80–5.10)
RDW: 12.8 % (ref 11.0–15.0)
Total Lymphocyte: 37.5 %
WBC: 7 10*3/uL (ref 3.8–10.8)

## 2024-05-02 ENCOUNTER — Ambulatory Visit: Admitting: Student in an Organized Health Care Education/Training Program

## 2024-05-02 ENCOUNTER — Encounter: Payer: Self-pay | Admitting: Student in an Organized Health Care Education/Training Program

## 2024-05-02 VITALS — BP 116/72 | HR 93 | Temp 99.2°F | Ht 67.0 in | Wt 172.8 lb

## 2024-05-02 DIAGNOSIS — J4540 Moderate persistent asthma, uncomplicated: Secondary | ICD-10-CM | POA: Diagnosis not present

## 2024-05-02 DIAGNOSIS — F17200 Nicotine dependence, unspecified, uncomplicated: Secondary | ICD-10-CM

## 2024-05-02 LAB — NITRIC OXIDE: Nitric Oxide: 20

## 2024-05-02 MED ORDER — NICOTINE POLACRILEX 2 MG MT LOZG: 2.0000 mg | LOZENGE | OROMUCOSAL | 6 refills | Status: AC | PRN

## 2024-05-02 MED ORDER — CLOTRIMAZOLE 10 MG MT TROC: 10.0000 mg | Freq: Every day | OROMUCOSAL | 0 refills | Status: AC

## 2024-05-02 MED ORDER — FLUTICASONE-SALMETEROL 230-21 MCG/ACT IN AERO: 2.0000 | INHALATION_SPRAY | Freq: Two times a day (BID) | RESPIRATORY_TRACT | 12 refills | Status: DC

## 2024-05-02 MED ORDER — AEROCHAMBER MV MISC: 0 refills | Status: AC

## 2024-05-02 NOTE — Patient Instructions (Addendum)
 Today, I ordered blood work. You can get them draw at your preferred [facility] draw station. The nearest one to [facility] is at nearby [facility] ([address] [place], [place], [place] [address]).  [facility] Quitline: Call 1-800-QUIT-NOW (856-592-6161). The [facility]uit[facility] is a free service for [employer]. Trained counselors are available from 8 am until 3 am, 365 days per year. Services are available in both [place] and [place].   Web Resources Free online support programs can help you track your progress and share experiences with others who are quitting. These are examples: www.becomeanex.org www.trytostop.org  www.smokefree.gov  www.https://www.vargas.com/.aspx  [facility]: offers comprehensive in-person tobacco treatment counseling at [facility] building ([address], [place] [address]).  Open to everyone. Virtual appointments available. Free parking. Call [phone] to schedule an appointment or [phone] for general information.    Tobacco Cessation Medications  Nicotine Replacement Therapy (NRT)  Nicotine is the addictive part of tobacco smoke, but not the most dangerous part. There are 7000 other toxins in cigarettes, including carbon monoxide, that cause disease. People do not generally become addicted to medication. Common problems: People don't use enough medication or stop too early. Medications are safe and effective. Overdose is very uncommon. Use medications as long as needed (3 months minimum). Some combinations work better than single medications. Long acting medications like the NRT patch and bupropion provide continuous treatment for withdrawal symptoms.  PLUS  Short acting medications like the NRT gum, lozenge, inhaler, and nasal spray help people to cope with breakthrough cravings.  ? Nicotine Patch  Place patch on hairless skin on upper body, including arms and back. Each day: discard old patch,  shower, apply new patch to a different site. Apply hydrocortisone cream to mildly red/irritated areas. Call provider if rash develops. If patch causes sleep disturbance, remove patch at bedtime and replace each morning after shower. Side effects may include: skin irritation, headache, insomnia, abnormal/vivid dreams.  ? Nicotine Gum  Chew gum slowly, park in cheek when peppery taste or tingling sensation begins (about 15-30 chews). When taste or tingling goes away, begin chewing again. Use until nicotine is gone (taste or tingle does not return, usually 30 minutes). Park in different areas of mouth. Nicotine is absorbed through the lining of the mouth. Use enough to control cravings, up to 24 pieces per day (if used alone). Avoid eating or drinking for 15 minutes before using and during use. Side effects may include: mouth/jaw soreness, hiccups, indigestion, hypersalivation.  If gum is not chewed correctly, additional side effects may include lightheadedness, nausea/vomiting, throat and mouth irritation.  ? Nicotine Lozenge  Allow to dissolve slowly in mouth (20-30 minutes). Do not chew or swallow. Nicotine release may cause a warm tingling sensation. Occasionally rotate to different areas of the mouth. Use enough to control cravings, up to 20 lozenges per day (if used alone). Avoid eating or drinking for 15 minutes before using and during use. Side effects may include: nausea, hiccups, cough, heartburn, headache, gas, insomnia.  ? Nicotine Nasal Spray Use 1 spray in each nostril (1 dose) and tilt head back for 1 minute. Do not sniff, swallow, or inhale through nose.  Use at least 8 doses (1 spray in each nostril) , up to 40 doses per day (if used alone). To reduce nasal irritation, spray on cotton swab and insert into nose. Side effects may include: nasal and/or throat irritation (hot, peppery, or burning sensation), nasal irritation, tearing, sneezing, cough, headache.  ?  Nicotine Oral  Inhaler (puffer) Inhale into the back of the throat or puff in short breaths. Do not inhale into the lungs.  Puff continuously for 20 minutes (about 80 puffs) until cartridge is empty. Change cartridge when it loses the "burning in throat" sensation (feels like air only). Open cartridges can be saved and used again within 24 hours. Use at least 6 and up to 16 cartridges per day (if used alone).  Avoid eating or drinking for 15 minutes before using and during use. Side effects may include: mouth and/or throat irritation, unpleasant taste, cough, nasal irritation, indigestion, hiccups, headache.  ? Chantix (varenicline) Days 1-3: Take one 0.5 mg white pill each morning for 3 days, one week before quit date. Days 4-7: Increase to one 0.5 mg white pill twice a day in morning and evening for 4 days.  On Day 8 (target quit date), increase to one 1 mg blue pill twice a day. Maintain this dose for a minimum of 3 months. Take with food and a full glass of water to reduce nausea. Be sure that the two doses are at least 8 hours apart, but try to take second dose early in the evening (i.e. 6 pm) to avoid sleep problems. Common side effects include: nausea, insomnia, headache, abnormal/vivid dreams. Tell your doctor if you have any history of psychiatric illness prior to starting Chantix.  STOP taking CHANTIX and contact a healthcare provider immediately if you experience agitation, hostility, depressed mood, changes in thoughts or behavior that are not typical for you, thinking about or attempting suicide, allergic or skin reactions including swelling, rash, redness, or peeling of the skin.  For patients who have heart disease: Smoking is a major risk factor for cardiovascular disease, and Chantix can help you quit smoking. Chantix may be associated with a small, increased risk of certain heart events in patients who have heart disease. If you have any new or worsening symptoms of heart disease while taking  Chantix, such as shortness of breath or trouble breathing, new or worsening chest pain, or new or worsening pain in your legs when walking, call your doctor or get emergency medical help immediately.  ? Wellbutrin / Zyban (bupropion) Take one 150 mg pill each morning for 3 days, one week before target quit date. On Day 4, increase to one 150 mg pill twice a day, morning and evening.  Maintain this dose for a minimum of 3 months. Be sure that the two doses are at least 8 hours apart, but try to take second dose early in the evening (i.e. 6 pm) to avoid sleep problems. Avoid or minimize use of alcohol when taking this medication. Common side effects include: dry mouth, headache, insomnia, nausea, weight loss.  Risk of seizure is 11/998. STOP taking BUPROPION and contact a healthcare provider immediately if you experience agitation, hostility, depressed mood, changes in thoughts or behavior that are not typical for you, thinking about or attempting suicide, allergic or skin reactions including swelling, rash, redness, or peeling of the skin.

## 2024-05-02 NOTE — Progress Notes (Signed)
 Assessment & Plan:   1. Moderate persistent asthma without complication (Primary)  Pleasant 48 year old female with history of childhood asthma presenting for the evaluation of cough and recurrent episodes of bronchitis that suggest poorly controlled asthma with recurrent exacerbation. Her history of smoking and long standing asthma could also predispose her to COPD/Asthma overlap.  On exam, there is no wheeze. She does report persistent symptoms. FENO in clinic today was normal at 20 ppb. I have personally reviewed her chest CT which shows airway thickening, but no parenchymal abnormalities.  We will initiate workup with a PFT to assess degree of obstruction, as well as obtain an allergen panel and CBC with differential to assess her t-helper cell type response.  For management, I will switch her inhalers from Breztri  to Advair HFA at the higher dose ICS (230-21) to be used with a spacer device. I will also give her a prescription for clotrimazole  troche empirically for thrush.  - Pulmonary Function Test; Future - Allergen Panel (27) + IGE - CBC with Differential/Platelet - Spacer/Aero-Holding Chambers (AEROCHAMBER MV) inhaler; Use as instructed  Dispense: 1 each; Refill: 0 - fluticasone -salmeterol (ADVAIR HFA) 230-21 MCG/ACT inhaler; Inhale 2 puffs into the lungs 2 (two) times daily.  Dispense: 12 g; Refill: 12 - Clotrimazole  troche - Nitric oxide at 20 ppb  2. Tobacco use disorder  Reports history of smoking, and continues to smoke despite respiratory symptoms and asthma. Patient counseled on the importance of smoking cessation. Will prescribe nicotine  lozenges to help with her effort.  - nicotine  polacrilex (NICOTINE  MINI) 2 MG lozenge; Take 1 lozenge (2 mg total) by mouth every 2 (two) hours as needed for smoking cessation.  Dispense: 135 lozenge; Refill: 6   Return in about 2 months (around 07/02/2024).  I spent 64 minutes caring for this patient today, including preparing to  see the patient, obtaining a medical history , reviewing a separately obtained history, performing a medically appropriate examination and/or evaluation, counseling and educating the patient/family/caregiver, ordering medications, tests, or procedures, documenting clinical information in the electronic health record, and independently interpreting results (not separately reported/billed) and communicating results to the patient/family/caregiver  Vergia Glasgow, MD [facility]  End of visit medications:  Meds ordered this encounter  Medications   Spacer/Aero-Holding Chambers (AEROCHAMBER MV) inhaler    Sig: Use as instructed    Dispense:  1 each    Refill:  0   fluticasone -salmeterol (ADVAIR HFA) 230-21 MCG/ACT inhaler    Sig: Inhale 2 puffs into the lungs 2 (two) times daily.    Dispense:  12 g    Refill:  12   nicotine  polacrilex (NICOTINE  MINI) 2 MG lozenge    Sig: Take 1 lozenge (2 mg total) by mouth every 2 (two) hours as needed for smoking cessation.    Dispense:  135 lozenge    Refill:  6     Current Outpatient Medications:    fluticasone -salmeterol (ADVAIR HFA) 230-21 MCG/ACT inhaler, Inhale 2 puffs into the lungs 2 (two) times daily., Disp: 12 g, Rfl: 12   nicotine  polacrilex (NICOTINE  MINI) 2 MG lozenge, Take 1 lozenge (2 mg total) by mouth every 2 (two) hours as needed for smoking cessation., Disp: 135 lozenge, Rfl: 6   Spacer/Aero-Holding Chambers (AEROCHAMBER MV) inhaler, Use as instructed, Disp: 1 each, Rfl: 0   albuterol  (VENTOLIN  HFA) 108 (90 Base) MCG/ACT inhaler, USE 2 INHALATIONS EVERY 6 HOURS AS NEEDED FOR WHEEZING OR SHORTNESS OF BREATH, Disp: 17 g, Rfl:  4   ALPRAZolam  (XANAX ) 0.5 MG tablet, Take 1 tablet (0.5 mg total) by mouth daily as needed for anxiety., Disp: 20 tablet, Rfl: 0   B Complex Vitamins (VITAMIN B-COMPLEX) TABS, Take 1 tablet by mouth daily., Disp: , Rfl:    benzonatate  (TESSALON ) 100 MG capsule, Take 100 mg by mouth 3 (three)  times daily as needed., Disp: , Rfl:    busPIRone  (BUSPAR ) 5 MG tablet, Take 1 tablet (5 mg total) by mouth daily as needed., Disp: 90 tablet, Rfl: 1   chlorpheniramine-HYDROcodone (TUSSIONEX) 10-8 MG/5ML, Take 5 mLs by mouth every 12 (twelve) hours as needed., Disp: 115 mL, Rfl: 0   cyanocobalamin  (VITAMIN B12) 500 MCG tablet, Take 500 mcg by mouth daily., Disp: , Rfl:    escitalopram  (LEXAPRO ) 20 MG tablet, Take 1 tablet (20 mg total) by mouth daily., Disp: 90 tablet, Rfl: 1   ipratropium-albuterol  (DUONEB) 0.5-2.5 (3) MG/3ML SOLN, Take 3 mLs by nebulization 3 (three) times daily as needed (wheeze, coughing fits, chest tightness, SOB)., Disp: 180 mL, Rfl: 2   levocetirizine (XYZAL ) 5 MG tablet, Take 1 tablet (5 mg total) by mouth daily., Disp: 90 tablet, Rfl: 1   levonorgestrel  (LILETTA ) 20.1 MCG/DAY IUD IUD, 1 each by Intrauterine route once., Disp: , Rfl:    loratadine (CLARITIN) 10 MG tablet, Take 10 mg by mouth daily., Disp: , Rfl:    losartan  (COZAAR ) 25 MG tablet, Take 1 tablet (25 mg total) by mouth daily., Disp: 90 tablet, Rfl: 1   QUEtiapine  (SEROQUEL ) 25 MG tablet, Take 1 tablet (25 mg total) by mouth at bedtime., Disp: 90 tablet, Rfl: 1   rizatriptan  (MAXALT -MLT) 10 MG disintegrating tablet, Take 1 tablet (10 mg total) by mouth as needed for migraine. May repeat in 2 hours if needed, Disp: 27 tablet, Rfl: 0   rOPINIRole  (REQUIP ) 1 MG tablet, Take 1 tablet (1 mg total) by mouth 2 (two) times daily., Disp: 180 tablet, Rfl: 1   Semaglutide , 1 MG/DOSE, 4 MG/3ML SOPN, Inject 1 mg as directed once a week., Disp: 9 mL, Rfl: 1   Vitamin D , Ergocalciferol , (DRISDOL ) 1.25 MG (50000 UNIT) CAPS capsule, Take 1 capsule (50,000 Units total) by mouth every 7 (seven) days., Disp: 12 capsule, Rfl: 1   Subjective:   PATIENT ID: Destiny Atkinson Purves GENDER: female DOB: 02/11/1976, MRN: [id number]  Chief Complaint  Patient presents with   Follow-up    Continuous cough and rattle in chest.  Seen PCP x 2  in May.    HPI  Patient is a pleasant 48 year old female with a past medical history of asthma presenting to clinic for the evaluation of cough.  Patient reports symptom onset of around a month ago when she was seen by her primary care physician and prescribed a course of antibiotics as well as steroids.  She had reported shortness of breath, wheeze, and a cough that is productive of sputum. Steroids helped improve the symptoms but they would quickly recur.   She was seen in urgent care where another course of antibiotics and steroids was prescribed.  Again, this improved symptoms but they were quick to recur.  The cough was persistent and was subsequently productive of phlegm that had streaks of blood in it.  She was sent for a CT scan of the chest that did not show any parenchymal abnormalities but was noticeable for thickened airways.  Patient is currently maintained on Breztri , which she uses regularly.  She has had asthma since age 62.  She was told she has multiple allergies in the past.  She has been on multiple inhalers prior to Breztri , all of which were cycled through secondary to the development of thrush.  Patient currently works for a tobacco company in Air traffic controller.  She has worked from home since November.  She smokes around 15 cigarettes a day.  She has 4 cats which she does not allow in her bed room.  Ancillary information including prior medications, full medical/surgical/family/social histories, and PFTs (when available) are listed below and have been reviewed.   Review of Systems  Constitutional:  Negative for chills and fever.  Respiratory:  Positive for cough, sputum production, shortness of breath and wheezing.   Cardiovascular:  Negative for chest pain.     Objective:   Vitals:   05/02/24 0829  BP: 116/72  Pulse: 93  Temp: 99.2 F (37.3 C)  TempSrc: Oral  SpO2: 96%  Weight: 172 lb 12.8 oz (78.4 kg)  Height: 5' 7 (1.702 m)   96% on RA  BMI  Readings from Last 3 Encounters:  05/02/24 27.06 kg/m  04/24/24 27.55 kg/m  04/10/24 27.55 kg/m   Wt Readings from Last 3 Encounters:  05/02/24 172 lb 12.8 oz (78.4 kg)  04/24/24 170 lb 11.2 oz (77.4 kg)  04/10/24 170 lb 11.2 oz (77.4 kg)    Physical Exam Constitutional:      Appearance: Normal appearance.   Cardiovascular:     Rate and Rhythm: Normal rate and regular rhythm.     Pulses: Normal pulses.     Heart sounds: Normal heart sounds.  Pulmonary:     Effort: Pulmonary effort is normal.     Breath sounds: Normal breath sounds.  Abdominal:     Palpations: Abdomen is soft.   Neurological:     General: No focal deficit present.     Mental Status: She is alert and oriented to person, place, and time. Mental status is at baseline.       Ancillary Information    Past Medical History:  Diagnosis Date   Allergic rhinitis    Anxiety    Asthma    B12 deficiency    Decreased libido    Hypertension    Irregular menstrual bleeding    Migraines    without aura   Mild depression    Mobitz type 1 second degree atrioventricular block    Peripheral neuropathy    Tobacco use disorder      Family History  Problem Relation Age of Onset   COPD Mother    Fibromyalgia Mother    Hypertension Mother    Stroke Mother    Asthma Mother    Arthritis Mother    Depression Mother    Heart disease Mother    Crohn's disease Mother    Diabetes Father    Diabetes type II Daughter    Diabetes type II Daughter    Kidney Stones Daughter    Bipolar disorder Maternal Grandmother    Stomach cancer Paternal Grandmother 69   Epilepsy Brother    Ovarian cancer Other 45   Ovarian cancer Other 50   Breast cancer Neg Hx      Past Surgical History:  Procedure Laterality Date   COLONOSCOPY     in 8th grade, not as adult   ESOPHAGOGASTRODUODENOSCOPY     age 57   NASAL SINUS SURGERY  01/21/2010   septoplasty/ sinus surgery   WISDOM TOOTH EXTRACTION  age 28    Social  History    Socioeconomic History   Marital status: Married    Spouse name: Bearl Limes   Number of children: 2   Years of education: 14   Highest education level: Some college, no degree  Occupational History   Occupation: Associate Professor: [employer]  Tobacco Use   Smoking status: Every Day    Current packs/day: 0.25    Average packs/day: 0.3 packs/day for 28.8 years (7.2 ttl pk-yrs)    Types: Cigarettes, Atkinson-cigarettes    Start date: 06/29/1995   Smokeless tobacco: Never  Vaping Use   Vaping status: Former   Start date: 02/14/2015   Quit date: 11/15/2018  Substance and Sexual Activity   Alcohol use: Yes    Alcohol/week: 0.0 standard drinks of alcohol    Comment: rarely   Drug use: No   Sexual activity: Yes    Partners: Male    Birth control/protection: I.U.D.  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, community   Social Drivers of Health   Financial Resource Strain: Medium Risk (11/13/2023)   Overall Financial Resource Strain (CARDIA)    Difficulty of Paying Living Expenses: Somewhat hard  Food Insecurity: No Food Insecurity (11/13/2023)   Hunger Vital Sign    Worried About Running Out of Food in the Last Year: Never true    Ran Out of Food in the Last Year: Never true  Transportation Needs: Unmet Transportation Needs (11/13/2023)   PRAPARE - Transportation    Lack of Transportation (Medical): No    Lack of Transportation (Non-Medical): Yes  Physical Activity: Insufficiently Active (11/13/2023)   Exercise Vital Sign    Days of Exercise per Week: 4 days    Minutes of Exercise per Session: 10 min  Stress: Stress Concern Present (11/13/2023)   [employer] of Occupational Health - Occupational Stress Questionnaire    Feeling of Stress : Rather much  Social Connections: Socially Isolated (11/13/2023)   Social Connection and  Isolation Panel    Frequency of Communication with Friends and Family: Never    Frequency of Social Gatherings with Friends and Family: Never    Attends Religious Services: Never    Database administrator or Organizations: No    Attends Engineer, structural: Not on file    Marital Status: Married  Catering manager Violence: Not At Risk (07/04/2022)   Humiliation, Afraid, Rape, and Kick questionnaire    Fear of Current or Ex-Partner: No    Emotionally Abused: No    Physically Abused: No    Sexually Abused: No     Allergies  Allergen Reactions   Dog Epithelium     Anaphylaxis with Rabbits.  Dogs, cats, birds cause swelling, extreme itching.   Dust Mite Extract    Pollen Extract    Soap    Tape    Tree Extract      CBC    Component Value Date/Time   WBC 7.0 04/24/2024 1206   RBC 4.61 04/24/2024 1206   HGB 15.0 04/24/2024 1206   HCT 44.9 04/24/2024 1206   PLT 260 04/24/2024 1206   MCV 97.4 04/24/2024 1206   MCH 32.5 04/24/2024 1206   MCHC 33.4 04/24/2024 1206   RDW 12.8 04/24/2024 1206   LYMPHSABS 1,997 07/13/2023 1002   MONOABS 0.8 04/28/2022 1634   EOSABS 462 04/24/2024  1206   BASOSABS 42 04/24/2024 1206    Pulmonary Functions Testing Results:     No data to display          Outpatient Medications Prior to Visit  Medication Sig Dispense Refill   albuterol  (VENTOLIN  HFA) 108 (90 Base) MCG/ACT inhaler USE 2 INHALATIONS EVERY 6 HOURS AS NEEDED FOR WHEEZING OR SHORTNESS OF BREATH 17 g 4   ALPRAZolam  (XANAX ) 0.5 MG tablet Take 1 tablet (0.5 mg total) by mouth daily as needed for anxiety. 20 tablet 0   B Complex Vitamins (VITAMIN B-COMPLEX) TABS Take 1 tablet by mouth daily.     benzonatate  (TESSALON ) 100 MG capsule Take 100 mg by mouth 3 (three) times daily as needed.     busPIRone  (BUSPAR ) 5 MG tablet Take 1 tablet (5 mg total) by mouth daily as needed. 90 tablet 1   chlorpheniramine-HYDROcodone (TUSSIONEX) 10-8 MG/5ML Take 5 mLs by mouth every 12 (twelve)  hours as needed. 115 mL 0   cyanocobalamin  (VITAMIN B12) 500 MCG tablet Take 500 mcg by mouth daily.     escitalopram  (LEXAPRO ) 20 MG tablet Take 1 tablet (20 mg total) by mouth daily. 90 tablet 1   ipratropium-albuterol  (DUONEB) 0.5-2.5 (3) MG/3ML SOLN Take 3 mLs by nebulization 3 (three) times daily as needed (wheeze, coughing fits, chest tightness, SOB). 180 mL 2   levocetirizine (XYZAL ) 5 MG tablet Take 1 tablet (5 mg total) by mouth daily. 90 tablet 1   levonorgestrel  (LILETTA ) 20.1 MCG/DAY IUD IUD 1 each by Intrauterine route once.     loratadine (CLARITIN) 10 MG tablet Take 10 mg by mouth daily.     losartan  (COZAAR ) 25 MG tablet Take 1 tablet (25 mg total) by mouth daily. 90 tablet 1   QUEtiapine  (SEROQUEL ) 25 MG tablet Take 1 tablet (25 mg total) by mouth at bedtime. 90 tablet 1   rizatriptan  (MAXALT -MLT) 10 MG disintegrating tablet Take 1 tablet (10 mg total) by mouth as needed for migraine. May repeat in 2 hours if needed 27 tablet 0   rOPINIRole  (REQUIP ) 1 MG tablet Take 1 tablet (1 mg total) by mouth 2 (two) times daily. 180 tablet 1   Semaglutide , 1 MG/DOSE, 4 MG/3ML SOPN Inject 1 mg as directed once a week. 9 mL 1   Vitamin D , Ergocalciferol , (DRISDOL ) 1.25 MG (50000 UNIT) CAPS capsule Take 1 capsule (50,000 Units total) by mouth every 7 (seven) days. 12 capsule 1   budesonide -glycopyrrolate-formoterol  (BREZTRI  AEROSPHERE) 160-9-4.8 MCG/ACT AERO inhaler Inhale 2 puffs into the lungs in the morning and at bedtime. 1 each 5   No facility-administered medications prior to visit.

## 2024-05-06 ENCOUNTER — Encounter: Payer: Self-pay | Admitting: Student in an Organized Health Care Education/Training Program

## 2024-05-08 ENCOUNTER — Telehealth: Payer: Self-pay

## 2024-05-08 ENCOUNTER — Other Ambulatory Visit (HOSPITAL_COMMUNITY): Payer: Self-pay

## 2024-05-08 NOTE — Telephone Encounter (Signed)
*  Pulm  Pharmacy Patient Advocate Encounter   Received notification from RX Request Messages that prior authorization for Advair HFA 230-21MCG/ACT aerosol  is required/requested.   Insurance verification completed.   The patient is insured through [facility] .   Per test claim:  The patient's drug benefit plan provides coverage for other drugs which may be considered for treating your patient. Can your patient be treated with a formulary drug? Available Formulary Alternatives: budesonide -formoterol , fluticasone -salmeterol ([id number], [id number]), Breyna , Wixela Inhub, BREO ELLIPTA (except certain NDCs) [NOTE: If yes, provide your patient with a new prescription for the formulary product.]* is preferred by the insurance.  If suggested medication is appropriate, Please send in a new RX and discontinue this one. If not, please advise as to why it's not appropriate so that we may request a Prior Authorization. Please note, some preferred medications may still require a PA.  If the suggested medications have not been trialed and there are no contraindications to their use, the PA will not be submitted, as it will not be approved.

## 2024-05-08 NOTE — Telephone Encounter (Signed)
 What alternatives has the patient failed?

## 2024-05-09 MED ORDER — FLUTICASONE-SALMETEROL 250-50 MCG/ACT IN AEPB: 1.0000 | INHALATION_SPRAY | Freq: Two times a day (BID) | RESPIRATORY_TRACT | 3 refills | Status: AC

## 2024-05-09 NOTE — Telephone Encounter (Signed)
 Wixela order placed and Advair order discontinued for change in therapy.

## 2024-05-09 NOTE — Addendum Note (Signed)
 Addended by: Maury Bamba J on: 05/09/2024 11:11 AM   Modules accepted: Orders

## 2024-05-24 LAB — ALLERGEN PANEL (27) + IGE
Alternaria Alternata IgE: 0.75 kU/L — AB
Aspergillus Fumigatus IgE: <0.1 kU/L
Bahia Grass IgE: <0.1 kU/L
Bermuda Grass IgE: <0.1 kU/L
Cat Dander IgE: 34.1 kU/L — AB
Cedar, Mountain IgE: <0.1 kU/L
Cladosporium Herbarum IgE: <0.1 kU/L
Cocklebur IgE: <0.1 kU/L
Cockroach, American IgE: <0.1 kU/L
Common Silver Birch IgE: <0.1 kU/L
D Farinae IgE: 0.19 kU/L — AB
D Pteronyssinus IgE: 0.3 kU/L — AB
Dog Dander IgE: 7.13 kU/L — AB
Elm, American IgE: <0.1 kU/L
Hickory, White IgE: <0.1 kU/L
IgE (Immunoglobulin E), Serum: 171 [IU]/mL (ref 6–495)
Johnson Grass IgE: <0.1 kU/L
Kentucky Bluegrass IgE: 0.61 kU/L — AB
Maple/Box Elder IgE: <0.1 kU/L
Mucor Racemosus IgE: <0.1 kU/L
Oak, White IgE: <0.1 kU/L
Penicillium Chrysogen IgE: <0.1 kU/L
Pigweed, Rough IgE: <0.1 kU/L
Plantain, English IgE: 0.29 kU/L — AB
Ragweed, Short IgE: 0.19 kU/L — AB
Setomelanomma Rostrat: <0.1 kU/L
Timothy Grass IgE: 0.32 kU/L — AB
White Mulberry IgE: <0.1 kU/L

## 2024-05-24 LAB — CBC WITH DIFFERENTIAL/PLATELET
Basophils Absolute: 0.1 x10E3/uL (ref 0.0–0.2)
Basos: 1 %
EOS (ABSOLUTE): 0.5 x10E3/uL — ABNORMAL HIGH (ref 0.0–0.4)
Eos: 7 %
Hematocrit: 43.3 % (ref 34.0–46.6)
Hemoglobin: 14.6 g/dL (ref 11.1–15.9)
Immature Grans (Abs): 0 x10E3/uL (ref 0.0–0.1)
Immature Granulocytes: 0 %
Lymphocytes Absolute: 2.8 x10E3/uL (ref 0.7–3.1)
Lymphs: 37 %
MCH: 32.6 pg (ref 26.6–33.0)
MCHC: 33.7 g/dL (ref 31.5–35.7)
MCV: 97 fL (ref 79–97)
Monocytes Absolute: 0.6 x10E3/uL (ref 0.1–0.9)
Monocytes: 8 %
Neutrophils Absolute: 3.6 x10E3/uL (ref 1.4–7.0)
Neutrophils: 47 %
Platelets: 219 x10E3/uL (ref 150–450)
RBC: 4.48 x10E6/uL (ref 3.77–5.28)
RDW: 12.7 % (ref 11.7–15.4)
WBC: 7.6 x10E3/uL (ref 3.4–10.8)

## 2024-05-28 ENCOUNTER — Ambulatory Visit: Payer: Self-pay | Admitting: Student in an Organized Health Care Education/Training Program

## 2024-05-31 ENCOUNTER — Ambulatory Visit: Admitting: Student in an Organized Health Care Education/Training Program

## 2024-06-04 ENCOUNTER — Encounter: Payer: Self-pay | Admitting: Family Medicine

## 2024-06-12 ENCOUNTER — Encounter: Admitting: Family Medicine

## 2024-06-20 ENCOUNTER — Ambulatory Visit: Admitting: Family Medicine

## 2024-06-20 ENCOUNTER — Encounter: Payer: Self-pay | Admitting: Family Medicine

## 2024-06-20 VITALS — BP 114/72 | HR 97 | Resp 16 | Ht 64.25 in | Wt 166.3 lb

## 2024-06-20 DIAGNOSIS — I152 Hypertension secondary to endocrine disorders: Secondary | ICD-10-CM | POA: Diagnosis not present

## 2024-06-20 DIAGNOSIS — E1159 Type 2 diabetes mellitus with other circulatory complications: Secondary | ICD-10-CM | POA: Diagnosis not present

## 2024-06-20 DIAGNOSIS — I700 Atherosclerosis of aorta: Secondary | ICD-10-CM

## 2024-06-20 DIAGNOSIS — Z01411 Encounter for gynecological examination (general) (routine) with abnormal findings: Secondary | ICD-10-CM

## 2024-06-20 DIAGNOSIS — Z01419 Encounter for gynecological examination (general) (routine) without abnormal findings: Secondary | ICD-10-CM

## 2024-06-20 MED ORDER — LOSARTAN POTASSIUM 25 MG PO TABS: 12.5000 mg | ORAL_TABLET | Freq: Every day | ORAL | Status: DC

## 2024-06-20 NOTE — Patient Instructions (Signed)
 The phone number to schedule a mammogram at [facility] at [facility] is [phone]  Preventive Care 27-47 Years Old, Female Preventive care refers to lifestyle choices and visits with your health care provider that can promote health and wellness. Preventive care visits are also called wellness exams. What can I expect for my preventive care visit? Counseling Your health care provider may ask you questions about your: Medical history, including: Past medical problems. Family medical history. Pregnancy history. Current health, including: Menstrual cycle. Method of birth control. Emotional well-being. Home life and relationship well-being. Sexual activity and sexual health. Lifestyle, including: Alcohol, nicotine  or tobacco, and drug use. Access to firearms. Diet, exercise, and sleep habits. Work and work Astronomer. Sunscreen use. Safety issues such as seatbelt and bike helmet use. Physical exam Your health care provider will check your: Height and weight. These may be used to calculate your BMI (body mass index). BMI is a measurement that tells if you are at a healthy weight. Waist circumference. This measures the distance around your waistline. This measurement also tells if you are at a healthy weight and may help predict your risk of certain diseases, such as type 2 diabetes and high blood pressure. Heart rate and blood pressure. Body temperature. Skin for abnormal spots. What immunizations do I need?  Vaccines are usually given at various ages, according to a schedule. Your health care provider will recommend vaccines for you based on your age, medical history, and lifestyle or other factors, such as travel or where you work. What tests do I need? Screening Your health care provider may recommend screening tests for certain conditions. This may include: Lipid and cholesterol levels. Diabetes screening. This is done by checking your blood sugar (glucose)  after you have not eaten for a while (fasting). Pelvic exam and Pap test. Hepatitis B test. Hepatitis C test. HIV (human immunodeficiency virus) test. STI (sexually transmitted infection) testing, if you are at risk. Lung cancer screening. Colorectal cancer screening. Mammogram. Talk with your health care provider about when you should start having regular mammograms. This may depend on whether you have a family history of breast cancer. BRCA-related cancer screening. This may be done if you have a family history of breast, ovarian, tubal, or peritoneal cancers. Bone density scan. This is done to screen for osteoporosis. Talk with your health care provider about your test results, treatment options, and if necessary, the need for more tests. Follow these instructions at home: Eating and drinking  Eat a diet that includes fresh fruits and vegetables, whole grains, lean protein, and low-fat dairy products. Take vitamin and mineral supplements as recommended by your health care provider. Do not drink alcohol if: Your health care provider tells you not to drink. You are pregnant, may be pregnant, or are planning to become pregnant. If you drink alcohol: Limit how much you have to 0-1 drink a day. Know how much alcohol is in your drink. In the [place] one drink equals one 12 oz bottle of beer (355 mL), one 5 oz glass of wine (148 mL), or one 1 oz glass of hard liquor (44 mL). Lifestyle Brush your teeth every morning and night with fluoride toothpaste. Floss one time each day. Exercise for at least 30 minutes 5 or more days each week. Do not use any products that contain nicotine  or tobacco. These products include cigarettes, chewing tobacco, and vaping devices, such as e-cigarettes. If you need help quitting, ask your health care provider. Do not use drugs.  If you are sexually active, practice safe sex. Use a condom or other form of protection to prevent STIs. If you do not wish to become  pregnant, use a form of birth control. If you plan to become pregnant, see your health care provider for a prepregnancy visit. Take aspirin only as told by your health care provider. Make sure that you understand how much to take and what form to take. Work with your health care provider to find out whether it is safe and beneficial for you to take aspirin daily. Find healthy ways to manage stress, such as: Meditation, yoga, or listening to music. Journaling. Talking to a trusted person. Spending time with friends and family. Minimize exposure to UV radiation to reduce your risk of skin cancer. Safety Always wear your seat belt while driving or riding in a vehicle. Do not drive: If you have been drinking alcohol. Do not ride with someone who has been drinking. When you are tired or distracted. While texting. If you have been using any mind-altering substances or drugs. Wear a helmet and other protective equipment during sports activities. If you have firearms in your house, make sure you follow all gun safety procedures. Seek help if you have been physically or sexually abused. What's next? Visit your health care provider once a year for an annual wellness visit. Ask your health care provider how often you should have your eyes and teeth checked. Stay up to date on all vaccines. This information is not intended to replace advice given to you by your health care provider. Make sure you discuss any questions you have with your health care provider. Document Revised: 05/05/2021 Document Reviewed: 05/05/2021 Elsevier Patient Education  2024 [employer].

## 2024-06-20 NOTE — Progress Notes (Signed)
 Name: Destiny Atkinson   MRN: [id number]    DOB: [birth year]   Date:06/20/2024       Progress Note  Subjective  Chief Complaint  Chief Complaint  Patient presents with   Annual Exam    HPI  Patient presents for annual CPE.  Diet: smaller portions , discussed cutting down on carbohydrates  Exercise: 20 minutes per day about 5 days a week  Last Eye Exam: up to date  Last Dental Exam: completed  Flowsheet Row Office Visit from 06/20/2024 in [facility]  AUDIT-C Score 0   Depression: Phq 9 is  positive    06/20/2024    3:15 PM 04/24/2024   11:31 AM 04/10/2024    1:19 PM 11/16/2023   11:35 AM 07/13/2023    8:49 AM  Depression screen PHQ 2/9  Decreased Interest 3 2 2 1  0  Down, Depressed, Hopeless 3 2 2 1  0  PHQ - 2 Score 6 4 4 2  0  Altered sleeping 3 2 2 3 2   Tired, decreased energy 3 2 2  0 2  Change in appetite 3 2 2  0 0  Feeling bad or failure about yourself  2 0 0 1 1  Trouble concentrating 2 2 2  0 0  Moving slowly or fidgety/restless 0 0 0 0 0  Suicidal thoughts 0 0 0 0 0  PHQ-9 Score 19 12 12 6 5   Difficult doing work/chores Very difficult Somewhat difficult Somewhat difficult Somewhat difficult Not difficult at all   Hypertension: BP Readings from Last 3 Encounters:  06/20/24 114/72  05/02/24 116/72  04/24/24 104/68   Obesity: Wt Readings from Last 3 Encounters:  06/20/24 166 lb 4.8 oz (75.4 kg)  05/02/24 172 lb 12.8 oz (78.4 kg)  04/24/24 170 lb 11.2 oz (77.4 kg)   BMI Readings from Last 3 Encounters:  06/20/24 28.32 kg/m  05/02/24 27.06 kg/m  04/24/24 27.55 kg/m     Vaccines: reviewed with the patient.   Hep C Screening: completed STD testing and prevention (HIV/chl/gon/syphilis): N/A Intimate partner violence: negative screen  Sexual History : developing vaginal dryness  Menstrual History/LMP/Abnormal Bleeding: she has an IUD progesterone type  Discussed importance of follow up if any post-menopausal bleeding: not  applicable  Incontinence Symptoms: positive for mild symptoms of stress incontinence when she has coughing spells   Breast cancer:  - Last Mammogram: up to date  - BRCA gene screening: N/A  Osteoporosis Prevention : Discussed high calcium and vitamin D  supplementation, weight bearing exercises Bone density :not applicable   Cervical cancer screening: up-to-date  Skin cancer: Discussed monitoring for atypical lesions  Colorectal cancer: up to date, negative cologuard    Lung cancer:  seeing pulmonologist had CT  ECG: 2023  Advanced Care Planning: A voluntary discussion about advance care planning including the explanation and discussion of advance directives.  Discussed health care proxy and Living will, and the patient was able to identify a health care proxy as husband  .  Patient does have a living will and power of attorney of health care   Patient Active Problem List   Diagnosis Date Noted   Thoracic aortic atherosclerosis (HCC) 06/20/2024   Hypertension associated with type 2 diabetes mellitus (HCC) 10/26/2021   Moderate major depression (HCC) 10/26/2021   Vitamin D  deficiency 12/28/2016   Mild major depression (HCC) 11/17/2015   GAD (generalized anxiety disorder) 05/06/2015   Moderate persistent asthma with allergic rhinitis without complication 05/06/2015   Mobitz type I  incomplete atrioventricular block 05/06/2015   B12 deficiency 05/06/2015   Essential (primary) hypertension 05/06/2015   Irregular bleeding 05/06/2015   Migraine without aura and responsive to treatment 05/06/2015   BMI 31.0-31.9,adult 05/06/2015   Peripheral neuropathy 05/06/2015   Perennial allergic rhinitis 05/06/2015   Restless leg 05/06/2015   Tobacco abuse 05/06/2015   Chronic bronchiolitis (HCC) 07/19/2002    Past Surgical History:  Procedure Laterality Date   COLONOSCOPY     in 8th grade, not as adult   ESOPHAGOGASTRODUODENOSCOPY     age 71   NASAL SINUS SURGERY  01/21/2010    septoplasty/ sinus surgery   WISDOM TOOTH EXTRACTION  age 64    Family History  Problem Relation Age of Onset   COPD Mother    Fibromyalgia Mother    Hypertension Mother    Stroke Mother    Asthma Mother    Arthritis Mother    Depression Mother    Heart disease Mother    Crohn's disease Mother    Anxiety disorder Mother    Obesity Mother    Diabetes Father    ADD / ADHD Father    Anxiety disorder Father    Depression Father    Diabetes type II Daughter    Anxiety disorder Daughter    Depression Daughter    Diabetes type II Daughter    Kidney Stones Daughter    Anxiety disorder Daughter    Depression Daughter    Diabetes Daughter    Obesity Daughter    Bipolar disorder Maternal Grandmother    Stomach cancer Paternal Grandmother 68   Miscarriages / Stillbirths Paternal Grandmother    Epilepsy Brother    ADD / ADHD Brother    Heart disease Brother    Obesity Brother    Ovarian cancer Other 45   Ovarian cancer Other 50   Heart disease Paternal Grandfather    Asthma Brother    Obesity Brother    Miscarriages / Stillbirths Paternal Aunt    Miscarriages / Stillbirths Paternal Aunt    Obesity Brother    Breast cancer Neg Hx     Social History   Socioeconomic History   Marital status: Married    Spouse name: Toribio   Number of children: 2   Years of education: 14   Highest education level: Some college, no degree  Occupational History   Occupation: Associate Professor: [employer]  Tobacco Use   Smoking status: Every Day    Current packs/day: 0.25    Average packs/day: 0.3 packs/day for 29.0 years (7.2 ttl pk-yrs)    Types: Cigarettes, E-cigarettes    Start date: 06/29/1995   Smokeless tobacco: Never  Vaping Use   Vaping status: Former   Start date: 02/14/2015   Quit date: 11/15/2018  Substance and Sexual Activity   Alcohol use: Yes    Alcohol/week: 0.0 standard drinks of alcohol    Comment: rarely   Drug use: No   Sexual  activity: Yes    Partners: Male    Birth control/protection: I.U.D.  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, community   Social Drivers of Corporate investment banker Strain: Low Risk  (06/20/2024)   Overall Financial Resource Strain (CARDIA)    Difficulty of Paying Living Expenses: Not hard at all  Food Insecurity: No  Food Insecurity (06/20/2024)   Hunger Vital Sign    Worried About Running Out of Food in the Last Year: Never true    Ran Out of Food in the Last Year: Never true  Transportation Needs: No Transportation Needs (06/20/2024)   PRAPARE - Administrator, Civil Service (Medical): No    Lack of Transportation (Non-Medical): No  Physical Activity: Insufficiently Active (06/20/2024)   Exercise Vital Sign    Days of Exercise per Week: 5 days    Minutes of Exercise per Session: 20 min  Stress: Stress Concern Present (06/20/2024)   [employer] of Occupational Health - Occupational Stress Questionnaire    Feeling of Stress: Very much  Social Connections: Moderately Isolated (06/20/2024)   Social Connection and Isolation Panel    Frequency of Communication with Friends and Family: Three times a week    Frequency of Social Gatherings with Friends and Family: Three times a week    Attends Religious Services: Never    Active Member of Clubs or Organizations: No    Attends Banker Meetings: Never    Marital Status: Married  Catering manager Violence: Not At Risk (06/20/2024)   Humiliation, Afraid, Rape, and Kick questionnaire    Fear of Current or Ex-Partner: No    Emotionally Abused: No    Physically Abused: No    Sexually Abused: No     Current Outpatient Medications:    albuterol  (VENTOLIN  HFA) 108 (90 Base) MCG/ACT inhaler, USE 2 INHALATIONS EVERY 6 HOURS AS NEEDED FOR WHEEZING OR SHORTNESS OF BREATH, Disp: 17 g, Rfl: 4    ALPRAZolam  (XANAX ) 0.5 MG tablet, Take 1 tablet (0.5 mg total) by mouth daily as needed for anxiety., Disp: 20 tablet, Rfl: 0   busPIRone  (BUSPAR ) 5 MG tablet, Take 1 tablet (5 mg total) by mouth daily as needed., Disp: 90 tablet, Rfl: 1   escitalopram  (LEXAPRO ) 20 MG tablet, Take 1 tablet (20 mg total) by mouth daily., Disp: 90 tablet, Rfl: 1   fluticasone -salmeterol (WIXELA INHUB) 250-50 MCG/ACT AEPB, Inhale 1 puff into the lungs in the morning and at bedtime., Disp: 180 each, Rfl: 3   ipratropium-albuterol  (DUONEB) 0.5-2.5 (3) MG/3ML SOLN, Take 3 mLs by nebulization 3 (three) times daily as needed (wheeze, coughing fits, chest tightness, SOB)., Disp: 180 mL, Rfl: 2   levocetirizine (XYZAL ) 5 MG tablet, Take 1 tablet (5 mg total) by mouth daily., Disp: 90 tablet, Rfl: 1   levonorgestrel  (LILETTA ) 20.1 MCG/DAY IUD IUD, 1 each by Intrauterine route once., Disp: , Rfl:    loratadine (CLARITIN) 10 MG tablet, Take 10 mg by mouth daily., Disp: , Rfl:    losartan  (COZAAR ) 25 MG tablet, Take 1 tablet (25 mg total) by mouth daily., Disp: 90 tablet, Rfl: 1   QUEtiapine  (SEROQUEL ) 25 MG tablet, Take 1 tablet (25 mg total) by mouth at bedtime., Disp: 90 tablet, Rfl: 1   rizatriptan  (MAXALT -MLT) 10 MG disintegrating tablet, Take 1 tablet (10 mg total) by mouth as needed for migraine. May repeat in 2 hours if needed, Disp: 27 tablet, Rfl: 0   rOPINIRole  (REQUIP ) 1 MG tablet, Take 1 tablet (1 mg total) by mouth 2 (two) times daily., Disp: 180 tablet, Rfl: 1   Semaglutide , 1 MG/DOSE, 4 MG/3ML SOPN, Inject 1 mg as directed once a week., Disp: 9 mL, Rfl: 1   Spacer/Aero-Holding Chambers (AEROCHAMBER MV) inhaler, Use as instructed, Disp: 1 each, Rfl: 0   B Complex Vitamins (VITAMIN B-COMPLEX) TABS, Take 1  tablet by mouth daily. (Patient not taking: Reported on 06/20/2024), Disp: , Rfl:    cyanocobalamin  (VITAMIN B12) 500 MCG tablet, Take 500 mcg by mouth daily. (Patient not taking: Reported on 06/20/2024), Disp: , Rfl:     nicotine  polacrilex (NICOTINE  MINI) 2 MG lozenge, Take 1 lozenge (2 mg total) by mouth every 2 (two) hours as needed for smoking cessation. (Patient not taking: Reported on 06/20/2024), Disp: 135 lozenge, Rfl: 6   Vitamin D , Ergocalciferol , (DRISDOL ) 1.25 MG (50000 UNIT) CAPS capsule, Take 1 capsule (50,000 Units total) by mouth every 7 (seven) days. (Patient not taking: Reported on 06/20/2024), Disp: 12 capsule, Rfl: 1  Allergies  Allergen Reactions   Dog Epithelium     Anaphylaxis with Rabbits.  Dogs, cats, birds cause swelling, extreme itching.   Dust Mite Extract    Pollen Extract    Soap    Tape    Tree Extract      ROS  Constitutional: Negative for fever or weight change.  Respiratory: Negative for cough and shortness of breath.   Cardiovascular: Negative for chest pain or palpitations.  Gastrointestinal: Negative for abdominal pain, no bowel changes.  Musculoskeletal: Negative for gait problem or joint swelling.  Skin: Negative for rash.  Neurological: Negative for dizziness or headache.  No other specific complaints in a complete review of systems (except as listed in HPI above).   Objective  Vitals:   06/20/24 1519  BP: 114/72  Pulse: 97  Resp: 16  SpO2: 98%  Weight: 166 lb 4.8 oz (75.4 kg)  Height: 5' 4.25 (1.632 m)    Body mass index is 28.32 kg/m.  Physical Exam  Constitutional: Patient appears well-developed and well-nourished. No distress.  HENT: Head: Normocephalic and atraumatic. Ears: B TMs ok, no erythema or effusion; Nose: Nose normal. Mouth/Throat: Oropharynx is clear and moist. No oropharyngeal exudate.  Eyes: Conjunctivae and EOM are normal. Pupils are equal, round, and reactive to light. No scleral icterus.  Neck: Normal range of motion. Neck supple. No JVD present. No thyromegaly present.  Cardiovascular: Normal rate, regular rhythm and normal heart sounds.  No murmur heard. No BLE edema. Pulmonary/Chest: Effort normal and breath sounds  normal. No respiratory distress. Abdominal: Soft. Bowel sounds are normal, no distension. There is no tenderness. no masses Breast: no lumps or masses, no nipple discharge or rashes FEMALE GENITALIA:  Not done  RECTAL: not done  Musculoskeletal: Normal range of motion, no joint effusions. No gross deformities Neurological: he is alert and oriented to person, place, and time. No cranial nerve deficit. Coordination, balance, strength, speech and gait are normal.  Skin: Skin is warm and dry. No rash noted. No erythema.  Psychiatric: Patient has a normal mood and affect. behavior is normal. Judgment and thought content normal.     Assessment & Plan  1. Well woman exam (Primary)   2. Hypertension associated with type 2 diabetes mellitus (HCC)  - losartan  (COZAAR ) 25 MG tablet; Take 0.5 tablets (12.5 mg total) by mouth daily.  3. Thoracic aortic atherosclerosis (HCC)  Reviewed CT    -USPSTF grade A and B recommendations reviewed with patient; age-appropriate recommendations, preventive care, screening tests, etc discussed and encouraged; healthy living encouraged; see AVS for patient education given to patient -Discussed importance of 150 minutes of physical activity weekly, eat two servings of fish weekly, eat one serving of tree nuts ( cashews, pistachios, pecans, almonds.SABRA) every other day, eat 6 servings of fruit/vegetables daily and drink plenty of water and avoid  sweet beverages.   -Reviewed Health Maintenance: Yes.

## 2024-07-23 ENCOUNTER — Encounter

## 2024-07-23 ENCOUNTER — Ambulatory Visit: Admitting: Student in an Organized Health Care Education/Training Program

## 2024-08-06 ENCOUNTER — Ambulatory Visit
Admission: RE | Admit: 2024-08-06 | Discharge: 2024-08-06 | Disposition: A | Discharge status: Home / Self Care | Source: Ambulatory Visit | Attending: Family Medicine | Admitting: Family Medicine

## 2024-08-06 DIAGNOSIS — Z1231 Encounter for screening mammogram for malignant neoplasm of breast: Secondary | ICD-10-CM | POA: Diagnosis present

## 2024-08-14 ENCOUNTER — Ambulatory Visit: Admitting: Obstetrics & Gynecology

## 2024-08-29 ENCOUNTER — Encounter: Payer: Self-pay | Admitting: Certified Nurse Midwife

## 2024-08-29 ENCOUNTER — Ambulatory Visit: Admitting: Certified Nurse Midwife

## 2024-08-29 VITALS — BP 143/80 | HR 78 | Resp 16 | Ht 67.0 in | Wt 168.9 lb

## 2024-08-29 DIAGNOSIS — Z30432 Encounter for removal of intrauterine contraceptive device: Secondary | ICD-10-CM

## 2024-08-29 MED ORDER — NORETHINDRONE 0.35 MG PO TABS: 1.0000 | ORAL_TABLET | Freq: Every day | ORAL | 4 refills | Status: AC

## 2024-08-29 NOTE — Progress Notes (Signed)
    GYNECOLOGY OFFICE PROCEDURE NOTE  LADAWN BOULLION is a 48 y.o. [id number] here for Liletta  IUD removal. No GYN concerns.  Last pap smear was on 07/05/2022 and was normal. She wants IUD removed because it feels like it has moved. She has been feeling cramping and would like to discuss other contraception options.  IUD Removal  Patient identified, informed consent performed, consent signed.  Patient was in the dorsal lithotomy position, normal external genitalia was noted.  A speculum was placed in the patient's vagina, normal discharge was noted, no lesions. The cervix was visualized, no lesions, no abnormal discharge.  The strings of the IUD were grasped and pulled using ring forceps. The IUD was removed in its entirety.   Patient tolerated the procedure well.    Discussion with patient about the risks of estrogen containing birth control with age and smoking status. She is also being treated for diabetes and being follow for hypertension. Low likelihood of pregnancy but she is still having some bleeding even with the Liletta . Will prescribe progesterone only pills.     Damien Parsley, CNM [facility]

## 2024-09-10 ENCOUNTER — Encounter: Payer: Self-pay | Admitting: Family Medicine

## 2024-09-10 ENCOUNTER — Ambulatory Visit: Admitting: Family Medicine

## 2024-09-10 VITALS — BP 114/76 | HR 90 | Resp 16 | Ht 67.0 in | Wt 166.3 lb

## 2024-09-10 DIAGNOSIS — Z1159 Encounter for screening for other viral diseases: Secondary | ICD-10-CM

## 2024-09-10 DIAGNOSIS — I700 Atherosclerosis of aorta: Secondary | ICD-10-CM | POA: Diagnosis not present

## 2024-09-10 DIAGNOSIS — Z7985 Long-term (current) use of injectable non-insulin antidiabetic drugs: Secondary | ICD-10-CM

## 2024-09-10 DIAGNOSIS — E1169 Type 2 diabetes mellitus with other specified complication: Secondary | ICD-10-CM

## 2024-09-10 DIAGNOSIS — I152 Hypertension secondary to endocrine disorders: Secondary | ICD-10-CM | POA: Diagnosis not present

## 2024-09-10 DIAGNOSIS — G2581 Restless legs syndrome: Secondary | ICD-10-CM

## 2024-09-10 DIAGNOSIS — G43009 Migraine without aura, not intractable, without status migrainosus: Secondary | ICD-10-CM

## 2024-09-10 DIAGNOSIS — E1159 Type 2 diabetes mellitus with other circulatory complications: Secondary | ICD-10-CM

## 2024-09-10 DIAGNOSIS — J4489 Other specified chronic obstructive pulmonary disease: Secondary | ICD-10-CM

## 2024-09-10 DIAGNOSIS — E785 Hyperlipidemia, unspecified: Secondary | ICD-10-CM

## 2024-09-10 DIAGNOSIS — F5105 Insomnia due to other mental disorder: Secondary | ICD-10-CM

## 2024-09-10 DIAGNOSIS — F411 Generalized anxiety disorder: Secondary | ICD-10-CM

## 2024-09-10 DIAGNOSIS — F330 Major depressive disorder, recurrent, mild: Secondary | ICD-10-CM

## 2024-09-10 LAB — POCT GLYCOSYLATED HEMOGLOBIN (HGB A1C): Hemoglobin A1C: 5.5 % (ref 4.0–5.6)

## 2024-09-10 MED ORDER — TELMISARTAN 20 MG PO TABS: 20.0000 mg | ORAL_TABLET | Freq: Every day | ORAL | 1 refills | Status: DC

## 2024-09-10 MED ORDER — RIZATRIPTAN BENZOATE 10 MG PO TBDP: 10.0000 mg | ORAL_TABLET | ORAL | 0 refills | Status: AC | PRN

## 2024-09-10 MED ORDER — TRAZODONE HCL 50 MG PO TABS: 25.0000 mg | ORAL_TABLET | Freq: Every evening | ORAL | 0 refills | Status: DC | PRN

## 2024-09-10 MED ORDER — ROPINIROLE HCL 1 MG PO TABS: 1.0000 mg | ORAL_TABLET | Freq: Two times a day (BID) | ORAL | 1 refills | Status: AC

## 2024-09-10 MED ORDER — TOPIRAMATE 25 MG PO TABS: 25.0000 mg | ORAL_TABLET | Freq: Every evening | ORAL | 0 refills | Status: DC

## 2024-09-10 MED ORDER — SEMAGLUTIDE (1 MG/DOSE) 4 MG/3ML ~~LOC~~ SOPN: 1.0000 mg | PEN_INJECTOR | SUBCUTANEOUS | 1 refills | Status: DC

## 2024-09-10 MED ORDER — ALPRAZOLAM 0.5 MG PO TABS: 0.5000 mg | ORAL_TABLET | Freq: Every day | ORAL | 0 refills | Status: AC | PRN

## 2024-09-10 NOTE — Progress Notes (Signed)
 Name: Destiny Atkinson   MRN: [id number]    DOB: September 15, 1976   Date:09/10/2024       Progress Note  Subjective  Chief Complaint  Chief Complaint  Patient presents with   Medical Management of Chronic Issues   Discussed the use of AI scribe software for clinical note transcription with the patient, who gave verbal consent to proceed.  History of Present Illness Destiny Atkinson is a 48 year old female with diabetes, hypertension, and COPD who presents for a five-month follow-up visit.  Her diabetes is well-controlled with an A1c of 5.5%, managed with Ozempic  1 mg weekly. She has lost 30 pounds since last year but has not lost more recently, with her current weight at 166 pounds. She engages in low-impact exercises through [facility] and is mindful of her portion sizes and nutrition.  She takes losartan  every other day for hypertension and reports experiencing dizziness and low blood pressure when taking it daily. No chest pain or palpitations are reported.  She has COPD with asthma and is under the care of an allergist/pulmonologist. Her medications include Ventolin , Duoneb, and Claritin. She avoids using Advair due to thrush concerns. She experiences wheezing, especially with animals in the house.  She has thoracic aortic atherosclerosis. Her cholesterol levels have improved with weight loss and dietary changes.  She experiences restless leg syndrome and takes Requip  1 mg twice daily, which is effective.  She reports an increase in migraine frequency, possibly due to seasonal changes affecting her sinuses and allergies. She takes Maxalt  once or twice a week for acute relief but does not have a preventive medication currently. Migraines are particularly behind the eyes, with no aura.  She has a history of major depression and generalized anxiety disorder. Her PHQ-9 score has improved from 19 to 6. She takes Lexapro  20 mg daily and Xanax  as needed, about two to three times a week due to  stress from her children.  She experiences insomnia and is currently taking Seroquel  at night, which she finds ineffective. Her mind races at night, leading to poor sleep quality.  She has perimenopausal symptoms and is taking Micronor  for birth control, which she takes consistently at 7 AM daily.    Patient Active Problem List   Diagnosis Date Noted   Thoracic aortic atherosclerosis 06/20/2024   Hypertension associated with type 2 diabetes mellitus (HCC) 10/26/2021   Moderate major depression (HCC) 10/26/2021   Vitamin D  deficiency 12/28/2016   Mild major depression (HCC) 11/17/2015   GAD (generalized anxiety disorder) 05/06/2015   Moderate persistent asthma with allergic rhinitis without complication 05/06/2015   Mobitz type I incomplete atrioventricular block 05/06/2015   B12 deficiency 05/06/2015   Essential (primary) hypertension 05/06/2015   Irregular bleeding 05/06/2015   Migraine without aura and responsive to treatment 05/06/2015   BMI 31.0-31.9,adult 05/06/2015   Peripheral neuropathy 05/06/2015   Perennial allergic rhinitis 05/06/2015   Restless leg 05/06/2015   Tobacco abuse 05/06/2015   Chronic bronchiolitis (HCC) 07/19/2002    Past Surgical History:  Procedure Laterality Date   COLONOSCOPY     in 8th grade, not as adult   ESOPHAGOGASTRODUODENOSCOPY     age 7   NASAL SINUS SURGERY  01/21/2010   septoplasty/ sinus surgery   WISDOM TOOTH EXTRACTION  age 32    Family History  Problem Relation Age of Onset   COPD Mother    Fibromyalgia Mother    Hypertension Mother    Stroke Mother    Asthma  Mother    Arthritis Mother    Depression Mother    Heart disease Mother    Crohn's disease Mother    Anxiety disorder Mother    Obesity Mother    Diabetes Father    ADD / ADHD Father    Anxiety disorder Father    Depression Father    Diabetes type II Daughter    Anxiety disorder Daughter    Depression Daughter    Diabetes type II Daughter    Kidney Stones  Daughter    Anxiety disorder Daughter    Depression Daughter    Diabetes Daughter    Obesity Daughter    Bipolar disorder Maternal Grandmother    Stomach cancer Paternal Grandmother 30   Miscarriages / Stillbirths Paternal Grandmother    Epilepsy Brother    ADD / ADHD Brother    Heart disease Brother    Obesity Brother    Ovarian cancer Other 45   Ovarian cancer Other 50   Heart disease Paternal Grandfather    Asthma Brother    Obesity Brother    Miscarriages / Stillbirths Paternal Aunt    Miscarriages / Stillbirths Paternal Aunt    Obesity Brother    Breast cancer Neg Hx     Social History   Tobacco Use   Smoking status: Every Day    Current packs/day: 0.25    Average packs/day: 0.3 packs/day for 29.2 years (7.3 ttl pk-yrs)    Types: Cigarettes, E-cigarettes    Start date: 06/29/1995   Smokeless tobacco: Never  Substance Use Topics   Alcohol use: Yes    Alcohol/week: 0.0 standard drinks of alcohol    Comment: rarely     Current Outpatient Medications:    albuterol  (VENTOLIN  HFA) 108 (90 Base) MCG/ACT inhaler, USE 2 INHALATIONS EVERY 6 HOURS AS NEEDED FOR WHEEZING OR SHORTNESS OF BREATH, Disp: 17 g, Rfl: 4   busPIRone  (BUSPAR ) 5 MG tablet, Take 1 tablet (5 mg total) by mouth daily as needed., Disp: 90 tablet, Rfl: 1   escitalopram  (LEXAPRO ) 20 MG tablet, Take 1 tablet (20 mg total) by mouth daily., Disp: 90 tablet, Rfl: 1   fluticasone -salmeterol (WIXELA INHUB) 250-50 MCG/ACT AEPB, Inhale 1 puff into the lungs in the morning and at bedtime., Disp: 180 each, Rfl: 3   ipratropium-albuterol  (DUONEB) 0.5-2.5 (3) MG/3ML SOLN, Take 3 mLs by nebulization 3 (three) times daily as needed (wheeze, coughing fits, chest tightness, SOB)., Disp: 180 mL, Rfl: 2   levocetirizine (XYZAL ) 5 MG tablet, Take 1 tablet (5 mg total) by mouth daily., Disp: 90 tablet, Rfl: 1   levonorgestrel  (LILETTA ) 20.1 MCG/DAY IUD IUD, 1 each by Intrauterine route once., Disp: , Rfl:    loratadine (CLARITIN)  10 MG tablet, Take 10 mg by mouth daily., Disp: , Rfl:    norethindrone  (MICRONOR ) 0.35 MG tablet, Take 1 tablet (0.35 mg total) by mouth daily., Disp: 84 tablet, Rfl: 4   Spacer/Aero-Holding Chambers (AEROCHAMBER MV) inhaler, Use as instructed, Disp: 1 each, Rfl: 0   telmisartan (MICARDIS) 20 MG tablet, Take 1 tablet (20 mg total) by mouth daily. In place of losartan , Disp: 90 tablet, Rfl: 1   topiramate (TOPAMAX) 25 MG tablet, Take 1-4 tablets (25-100 mg total) by mouth every evening., Disp: 90 tablet, Rfl: 0   traZODone (DESYREL) 50 MG tablet, Take 0.5-1 tablets (25-50 mg total) by mouth at bedtime as needed for sleep., Disp: 90 tablet, Rfl: 0   ALPRAZolam  (XANAX ) 0.5 MG tablet, Take 1 tablet (0.5  mg total) by mouth daily as needed for anxiety., Disp: 20 tablet, Rfl: 0   B Complex Vitamins (VITAMIN B-COMPLEX) TABS, Take 1 tablet by mouth daily. (Patient not taking: Reported on 09/10/2024), Disp: , Rfl:    cyanocobalamin  (VITAMIN B12) 500 MCG tablet, Take 500 mcg by mouth daily. (Patient not taking: Reported on 09/10/2024), Disp: , Rfl:    rizatriptan  (MAXALT -MLT) 10 MG disintegrating tablet, Take 1 tablet (10 mg total) by mouth as needed for migraine. May repeat in 2 hours if needed, Disp: 27 tablet, Rfl: 0   rOPINIRole  (REQUIP ) 1 MG tablet, Take 1 tablet (1 mg total) by mouth 2 (two) times daily., Disp: 180 tablet, Rfl: 1   Semaglutide , 1 MG/DOSE, 4 MG/3ML SOPN, Inject 1 mg as directed once a week., Disp: 9 mL, Rfl: 1  Allergies  Allergen Reactions   Dog Epithelium     Anaphylaxis with Rabbits.  Dogs, cats, birds cause swelling, extreme itching.   Dust Mite Extract    Pollen Extract    Soap    Tape    Tree Extract     I personally reviewed active problem list, medication list, allergies with the patient/caregiver today.   ROS  Ten systems reviewed and is negative except as mentioned in HPI    Objective Physical Exam  CONSTITUTIONAL: Patient appears well-developed and  well-nourished.  No distress. HEENT: Head atraumatic, normocephalic, neck supple. CARDIOVASCULAR: Normal rate, regular rhythm and normal heart sounds.  No murmur heard. No BLE edema. PULMONARY: Effort normal and breath sounds normal. No respiratory distress. ABDOMINAL: There is no tenderness or distention. MUSCULOSKELETAL: Normal gait. Without gross motor or sensory deficit. PSYCHIATRIC: Patient has a normal mood and affect. behavior is normal. Judgment and thought content normal.  Vitals:   09/10/24 1346  BP: 114/76  Pulse: 90  Resp: 16  SpO2: 98%  Weight: 166 lb 4.8 oz (75.4 kg)  Height: 5' 7 (1.702 m)    Body mass index is 26.05 kg/m.  Recent Results (from the past 2160 hours)  POCT glycosylated hemoglobin (Hb A1C)     Status: None   Collection Time: 09/10/24  1:49 PM  Result Value Ref Range   Hemoglobin A1C 5.5 4.0 - 5.6 %   HbA1c POC (<> result, manual entry)     HbA1c, POC (prediabetic range)     HbA1c, POC (controlled diabetic range)      Diabetic Foot Exam:     PHQ2/9:    09/10/2024    1:45 PM 06/20/2024    3:15 PM 04/24/2024   11:31 AM 04/10/2024    1:19 PM 11/16/2023   11:35 AM  Depression screen PHQ 2/9  Decreased Interest 1 3 2 2 1   Down, Depressed, Hopeless  3 2 2 1   PHQ - 2 Score 1 6 4 4 2   Altered sleeping 1 3 2 2 3   Tired, decreased energy 3 3 2 2  0  Change in appetite 0 3 2 2  0  Feeling bad or failure about yourself  0 2 0 0 1  Trouble concentrating 1 2 2 2  0  Moving slowly or fidgety/restless 0 0 0 0 0  Suicidal thoughts 0 0 0 0 0  PHQ-9 Score 6 19 12 12 6   Difficult doing work/chores Very difficult Very difficult Somewhat difficult Somewhat difficult Somewhat difficult    phq 9 is positive  Fall Risk:    09/10/2024    1:45 PM 06/20/2024    3:15 PM 04/24/2024   11:31 AM  04/10/2024    1:12 PM 04/04/2024   11:09 AM  Fall Risk   Falls in the past year? 1 0 0 0 0  Number falls in past yr: 0 0 0 0 0  Injury with Fall? 0 0 0 0 0  Risk for  fall due to : Impaired balance/gait No Fall Risks No Fall Risks No Fall Risks No Fall Risks  Follow up Falls evaluation completed Falls evaluation completed Falls prevention discussed;Education provided;Falls evaluation completed Falls prevention discussed;Education provided;Falls evaluation completed Falls prevention discussed;Education provided;Falls evaluation completed      Assessment & Plan Hypertension Hypertension managed with losartan , causing dizziness. Blood pressure 114/76 mmHg. - Discontinue losartan . - Prescribe Micardis (telmisartan) 20 mg daily.  Migraine without aura Increased frequency of migraines, possibly due to seasonal changes. Topamax may aid in weight loss and migraine prevention. - Prescribe Topamax (topiramate) starting at 25 mg at night, titrate up to 100 mg as tolerated. - Prescribe Nurtec every other day for prevention. - Prescribe Maxalt  for acute migraine relief.  Type 2 diabetes mellitus with associated HTN and dyslipidemia Type 2 diabetes mellitus well-controlled with an A1c of 5.5%. - Continue Ozempic  1 mg weekly. - Order lipid panel and urine protein test.  Obesity Obesity with a BMI of 26.05. Significant weight loss of 30 pounds since last year, but weight loss has plateaued. - Encourage continuation of low-impact exercises through [employer] - Advise on portion control and balanced diet with emphasis on protein and vegetables.  Asthma with chronic obstructive pulmonary disease (COPD) Asthma with COPD managed by an allergist/pulmonologist. Advair not tolerated due to thrush. Insurance limitations affect inhaler options. - Continue current asthma medications. - Consider alternative inhaler options if insurance allows. - still smoking  Restless legs syndrome Restless legs syndrome managed with Requip  1 mg twice daily. - Continue Requip  1 mg twice daily.  Major depressive disorder, recurrent, mild Major depressive disorder with PHQ-9 score  improved from 19 to 6. - Continue Lexapro  20 mg daily.  Generalized anxiety disorder Generalized anxiety disorder with increased anxiety due to family stressors. - Prescribe Xanax  as needed, 2-3 times per week.  Insomnia Insomnia with difficulty sleeping despite Seroquel  use. Mind racing at night. - Discontinue Seroquel . - Prescribe Trazodone 50 mg at night, start with half to one tablet.  Perimenopausal symptoms Perimenopausal symptoms managed with Micronor  due to contraindication for estrogen therapy. - Continue Micronor  as prescribed.  Atherosclerosis of thoracic aorta Atherosclerosis of thoracic aorta with plan to start statin therapy at age 61. Current cholesterol levels improving with weight loss. - Order lipid panel. - Plan to initiate statin therapy at age 15.

## 2024-09-11 ENCOUNTER — Ambulatory Visit: Payer: Self-pay | Admitting: Family Medicine

## 2024-09-11 LAB — HEPATITIS B SURFACE ANTIBODY,QUALITATIVE: Hep B S Ab: NONREACTIVE

## 2024-09-11 LAB — LIPID PANEL
Cholesterol: 204 mg/dL — ABNORMAL HIGH (ref <200)
HDL: 53 mg/dL (ref >=50)
LDL Cholesterol (Calc): 121 mg/dL — ABNORMAL HIGH
Non-HDL Cholesterol (Calc): 151 mg/dL — ABNORMAL HIGH (ref <130)
Total CHOL/HDL Ratio: 3.8 (calc) (ref <5.0)
Triglycerides: 183 mg/dL — ABNORMAL HIGH (ref <150)

## 2024-09-11 LAB — MICROALBUMIN / CREATININE URINE RATIO
Creatinine, Urine: 56 mg/dL (ref 20–275)
Microalb, Ur: <0.2 mg/dL

## 2024-09-17 ENCOUNTER — Ambulatory Visit: Admitting: Student in an Organized Health Care Education/Training Program

## 2024-09-17 ENCOUNTER — Encounter

## 2024-09-20 ENCOUNTER — Other Ambulatory Visit: Payer: Self-pay | Admitting: Medical Genetics

## 2024-09-20 DIAGNOSIS — Z006 Encounter for examination for normal comparison and control in clinical research program: Secondary | ICD-10-CM

## 2024-10-06 ENCOUNTER — Encounter: Payer: Self-pay | Admitting: Family Medicine

## 2024-12-01 ENCOUNTER — Other Ambulatory Visit: Payer: Self-pay | Admitting: Family Medicine

## 2024-12-01 DIAGNOSIS — F5105 Insomnia due to other mental disorder: Secondary | ICD-10-CM

## 2024-12-03 NOTE — Telephone Encounter (Signed)
 Requested Prescriptions  Pending Prescriptions Disp Refills   traZODone  (DESYREL ) 50 MG tablet [Pharmacy Med Name: TRAZODONE  50 MG TABLET] 90 tablet 0    Sig: TAKE 0.5-1 TABLETS BY MOUTH AT BEDTIME AS NEEDED FOR SLEEP.     Psychiatry: Antidepressants - Serotonin Modulator Passed - 12/03/2024  7:58 AM      Passed - Completed PHQ-2 or PHQ-9 in the last 360 days      Passed - Valid encounter within last 6 months    Recent Outpatient Visits           2 months ago Hypertension associated with type 2 diabetes mellitus [facility])   [facility] Glenard Mire, MD   5 months ago Well woman exam   [facility] Glenard Mire, MD   7 months ago Hemoptysis   [facility] Glenard Mire, MD   7 months ago Diabetes mellitus type 2 with complications [facility])   [place] [facility] Glenard Mire, MD   8 months ago Acute exacerbation of COPD with asthma [facility])   [facility] Sowles, Krichna, MD

## 2024-12-11 ENCOUNTER — Ambulatory Visit: Admitting: Family Medicine

## 2024-12-18 ENCOUNTER — Ambulatory Visit: Admitting: Family Medicine

## 2024-12-18 ENCOUNTER — Encounter: Payer: Self-pay | Admitting: Family Medicine

## 2024-12-18 VITALS — BP 124/72 | HR 97 | Resp 16 | Ht 67.0 in | Wt 175.2 lb

## 2024-12-18 DIAGNOSIS — F411 Generalized anxiety disorder: Secondary | ICD-10-CM

## 2024-12-18 DIAGNOSIS — E538 Deficiency of other specified B group vitamins: Secondary | ICD-10-CM

## 2024-12-18 DIAGNOSIS — G43009 Migraine without aura, not intractable, without status migrainosus: Secondary | ICD-10-CM

## 2024-12-18 DIAGNOSIS — E1159 Type 2 diabetes mellitus with other circulatory complications: Secondary | ICD-10-CM

## 2024-12-18 DIAGNOSIS — F325 Major depressive disorder, single episode, in full remission: Secondary | ICD-10-CM

## 2024-12-18 DIAGNOSIS — N951 Menopausal and female climacteric states: Secondary | ICD-10-CM

## 2024-12-18 DIAGNOSIS — G2581 Restless legs syndrome: Secondary | ICD-10-CM

## 2024-12-18 DIAGNOSIS — I700 Atherosclerosis of aorta: Secondary | ICD-10-CM

## 2024-12-18 DIAGNOSIS — E785 Hyperlipidemia, unspecified: Secondary | ICD-10-CM

## 2024-12-18 DIAGNOSIS — I152 Hypertension secondary to endocrine disorders: Secondary | ICD-10-CM

## 2024-12-18 DIAGNOSIS — E1169 Type 2 diabetes mellitus with other specified complication: Secondary | ICD-10-CM

## 2024-12-18 DIAGNOSIS — B370 Candidal stomatitis: Secondary | ICD-10-CM

## 2024-12-18 DIAGNOSIS — J4489 Other specified chronic obstructive pulmonary disease: Secondary | ICD-10-CM

## 2024-12-18 LAB — POCT GLYCOSYLATED HEMOGLOBIN (HGB A1C): Hemoglobin A1C: 5.7 % — AB (ref 4.0–5.6)

## 2024-12-18 MED ORDER — BUSPIRONE HCL 5 MG PO TABS: 5.0000 mg | ORAL_TABLET | Freq: Every day | ORAL | 1 refills | Status: AC | PRN

## 2024-12-18 MED ORDER — ESCITALOPRAM OXALATE 20 MG PO TABS: 20.0000 mg | ORAL_TABLET | Freq: Every day | ORAL | 1 refills | Status: AC

## 2024-12-18 MED ORDER — TELMISARTAN 20 MG PO TABS: 20.0000 mg | ORAL_TABLET | Freq: Every day | ORAL | 1 refills | Status: AC

## 2024-12-18 MED ORDER — NYSTATIN 100000 UNIT/ML MT SUSP: 5.0000 mL | Freq: Four times a day (QID) | OROMUCOSAL | 0 refills | Status: AC

## 2024-12-18 MED ORDER — OZEMPIC (0.25 OR 0.5 MG/DOSE) 2 MG/3ML ~~LOC~~ SOPN: 0.5000 mg | PEN_INJECTOR | SUBCUTANEOUS | 0 refills | Status: AC

## 2024-12-18 NOTE — Progress Notes (Signed)
 Name: Destiny Atkinson   MRN: [id number]    DOB: 1976/01/23   Date:12/18/2024       Progress Note  Subjective  Chief Complaint  Chief Complaint  Patient presents with   Medical Management of Chronic Issues   Discussed the use of AI scribe software for clinical note transcription with the patient, who gave verbal consent to proceed.  History of Present Illness Destiny Atkinson is a 49 year old female with COPD and asthma who presents with anxiety and perimenopausal symptoms.  She has been experiencing significant anxiety over the past month, primarily due to her family's recent illnesses. Her daughter had severe food poisoning, requiring two emergency room visits three weeks ago, and subsequently, her husband and other child contracted the flu, creating a stressful home environment. Despite these challenges, she did not contract any illness herself. She manages her anxiety with Lexapro  20 mg daily, Buspar  5 mg daily, and alprazolam  as needed, taking less than five per month.  She has been experiencing severe night sweats for about a month. She describes being drenched in sweat, including her scalp, which is a new symptom for her. She is currently on a progesterone-only birth control pill, Micronorm, and has not had a menstrual period in years. She also takes trazodone  for sleep, which was effective until the onset of night sweats.  Her history of migraines has improved since discontinuing Topamax  due to side effects of dizziness and double vision. She now manages migraines with rizatriptan , which she finds effective, using it less than twice a month.  She has a history of COPD with asthma and has been diligent with her inhaler use. She suspects she has developed oral thrush. She has reduced her smoking to five or six cigarettes a day.  She has hypertension and diabetes type 2 , previously managed with telmisartan  and Ozempic , respectively. She stopped Ozempic  two months ago due to severe diarrhea,  which she suspects exacerbated her IBS. Her weight has increased from 166 to 175 pounds since stopping Ozempic . She reports increased hunger since stopping Ozempic .   She also manages restless leg syndrome with Requip  and takes vitamin D  supplements for deficiency. She has not been taking B12 recently but plans to resume it as her levels were previously low.    Patient Active Problem List   Diagnosis Date Noted   Thoracic aortic atherosclerosis 06/20/2024   Hypertension associated with type 2 diabetes mellitus (HCC) 10/26/2021   Moderate major depression (HCC) 10/26/2021   Vitamin D  deficiency 12/28/2016   Mild major depression (HCC) 11/17/2015   GAD (generalized anxiety disorder) 05/06/2015   Moderate persistent asthma with allergic rhinitis without complication 05/06/2015   Mobitz type I incomplete atrioventricular block 05/06/2015   B12 deficiency 05/06/2015   Essential (primary) hypertension 05/06/2015   Irregular bleeding 05/06/2015   Migraine without aura and responsive to treatment 05/06/2015   BMI 31.0-31.9,adult 05/06/2015   Peripheral neuropathy 05/06/2015   Perennial allergic rhinitis 05/06/2015   Restless leg 05/06/2015   Tobacco abuse 05/06/2015   Chronic bronchiolitis (HCC) 07/19/2002    Past Surgical History:  Procedure Laterality Date   COLONOSCOPY     in 8th grade, not as adult   ESOPHAGOGASTRODUODENOSCOPY     age 77   NASAL SINUS SURGERY  01/21/2010   septoplasty/ sinus surgery   WISDOM TOOTH EXTRACTION  age 8    Family History  Problem Relation Age of Onset   COPD Mother    Fibromyalgia Mother  Hypertension Mother    Stroke Mother    Asthma Mother    Arthritis Mother    Depression Mother    Heart disease Mother    Crohn's disease Mother    Anxiety disorder Mother    Obesity Mother    Diabetes Father    ADD / ADHD Father    Anxiety disorder Father    Depression Father    Diabetes type II Daughter    Anxiety disorder Daughter    Depression  Daughter    Diabetes type II Daughter    Kidney Stones Daughter    Anxiety disorder Daughter    Depression Daughter    Diabetes Daughter    Obesity Daughter    Bipolar disorder Maternal Grandmother    Stomach cancer Paternal Grandmother 14   Miscarriages / Stillbirths Paternal Grandmother    Epilepsy Brother    ADD / ADHD Brother    Heart disease Brother    Obesity Brother    Ovarian cancer Other 45   Ovarian cancer Other 50   Heart disease Paternal Grandfather    Asthma Brother    Obesity Brother    Miscarriages / Stillbirths Paternal Aunt    Miscarriages / Stillbirths Paternal Aunt    Obesity Brother    Breast cancer Neg Hx     Social History   Tobacco Use   Smoking status: Every Day    Current packs/day: 0.25    Average packs/day: 0.3 packs/day for 29.5 years (7.4 ttl pk-yrs)    Types: Cigarettes, E-cigarettes    Start date: 06/29/1995   Smokeless tobacco: Never  Substance Use Topics   Alcohol use: Yes    Alcohol/week: 0.0 standard drinks of alcohol    Comment: rarely    Current Medications[1]  Allergies[2]  I personally reviewed active problem list, medication list, allergies, family history with the patient/caregiver today.   ROS  Ten systems reviewed and is negative except as mentioned in HPI    Objective Physical Exam MEASUREMENTS: Weight- 175. CONSTITUTIONAL: Patient appears well-developed and well-nourished. No distress. HEENT: Head atraumatic, normocephalic, neck supple. CARDIOVASCULAR: Normal rate, regular rhythm and normal heart sounds. No murmur heard. No edema in extremities. PULMONARY: Effort normal and breath sounds improved on auscultation. No respiratory distress. ABDOMINAL: There is no tenderness or distention. MUSCULOSKELETAL: Normal gait. Without gross motor or sensory deficit. PSYCHIATRIC: Patient has a normal mood and affect. Behavior is normal. Judgment and thought content normal.  Vitals:   12/18/24 1329  BP: 124/72  Pulse: 97   Resp: 16  SpO2: 100%  Weight: 175 lb 3.2 oz (79.5 kg)  Height: 5' 7 (1.702 m)    Body mass index is 27.44 kg/m.  Recent Results (from the past 2160 hours)  POCT glycosylated hemoglobin (Hb A1C)     Status: Abnormal   Collection Time: 12/18/24  1:38 PM  Result Value Ref Range   Hemoglobin A1C 5.7 (A) 4.0 - 5.6 %   HbA1c POC (<> result, manual entry)     HbA1c, POC (prediabetic range)     HbA1c, POC (controlled diabetic range)       PHQ2/9:    12/18/2024    1:30 PM 09/10/2024    1:45 PM 06/20/2024    3:15 PM 04/24/2024   11:31 AM 04/10/2024    1:19 PM  Depression screen PHQ 2/9  Decreased Interest 0 1 3 2 2   Down, Depressed, Hopeless 0  3 2 2   PHQ - 2 Score 0 1 6 4  4  Altered sleeping 0 1 3 2 2   Tired, decreased energy 0 3 3 2 2   Change in appetite 0 0 3 2 2   Feeling bad or failure about yourself  0 0 2 0 0  Trouble concentrating 0 1 2 2 2   Moving slowly or fidgety/restless 0 0 0 0 0  Suicidal thoughts 0 0 0 0 0  PHQ-9 Score 0 6  19  12  12    Difficult doing work/chores Not difficult at all Very difficult Very difficult Somewhat difficult Somewhat difficult     Data saved with a previous flowsheet row definition    phq 9 is negative  Fall Risk:    12/18/2024    1:24 PM 09/10/2024    1:45 PM 06/20/2024    3:15 PM 04/24/2024   11:31 AM 04/10/2024    1:12 PM  Fall Risk   Falls in the past year? 0 1 0 0 0  Number falls in past yr: 0 0 0 0 0  Injury with Fall? 0 0  0  0  0   Risk for fall due to : No Fall Risks Impaired balance/gait No Fall Risks No Fall Risks No Fall Risks  Follow up Falls evaluation completed Falls evaluation completed Falls evaluation completed Falls prevention discussed;Education provided;Falls evaluation completed Falls prevention discussed;Education provided;Falls evaluation completed     Data saved with a previous flowsheet row definition      Assessment & Plan Type 2 diabetes mellitus with hypertension Diabetes and hypertension  well-controlled. Weight gain noted after stopping Ozempic . - Continue telmisartan  20 mg. - Start Ozempic  0.25 mg for 2 weeks, then increase to 0.5 mg. - Monitor for gastrointestinal side effects. - Provided 110-month supply of Ozempic .  COPD with asthma Condition well-managed with inhalers. Smoking reduced. - Continue current inhaler regimen. - Encouraged smoking cessation. - Use breathing treatments as needed.  Perimenopausal symptoms Symptoms exacerbated by lack of estrogen. Current progesterone-only regimen due to clot risk. - Continue progesterone-only birth control but follow up with gyn   Generalized anxiety disorder Managed with Buspar  and Lexapro . Alprazolam  used sparingly. - Continue Buspar  and Lexapro . - Prescribed additional Buspar  and Lexapro . - Continue alprazolam  as needed.  Major depressive disorder in remission. In remission.  Migraine without aura Well-controlled with rizatriptan . Topamax  discontinued due to side effects. - Continue rizatriptan  as needed. - Discontinued Topamax .  Restless legs syndrome Well-managed with Requip . - Continue Requip  as needed.  Vitamin B12 deficiency Previously low B12 levels. - Recommended B12 supplements a few times a week.  Oral candidiasis Previous nystatin  treatment ineffective. - Prescribed nystatin  for 4-5 days, then stop for 24 hours.  General Health Maintenance Mammogram normal.        [1]  Current Outpatient Medications:    albuterol  (VENTOLIN  HFA) 108 (90 Base) MCG/ACT inhaler, USE 2 INHALATIONS EVERY 6 HOURS AS NEEDED FOR WHEEZING OR SHORTNESS OF BREATH, Disp: 17 g, Rfl: 4   ALPRAZolam  (XANAX ) 0.5 MG tablet, Take 1 tablet (0.5 mg total) by mouth daily as needed for anxiety., Disp: 20 tablet, Rfl: 0   busPIRone  (BUSPAR ) 5 MG tablet, Take 1 tablet (5 mg total) by mouth daily as needed., Disp: 90 tablet, Rfl: 1   escitalopram  (LEXAPRO ) 20 MG tablet, Take 1 tablet (20 mg total) by mouth daily., Disp: 90 tablet,  Rfl: 1   fluticasone -salmeterol (WIXELA INHUB) 250-50 MCG/ACT AEPB, Inhale 1 puff into the lungs in the morning and at bedtime., Disp: 180 each, Rfl: 3   ipratropium-albuterol  (DUONEB)  0.5-2.5 (3) MG/3ML SOLN, Take 3 mLs by nebulization 3 (three) times daily as needed (wheeze, coughing fits, chest tightness, SOB)., Disp: 180 mL, Rfl: 2   levocetirizine (XYZAL ) 5 MG tablet, Take 1 tablet (5 mg total) by mouth daily., Disp: 90 tablet, Rfl: 1   levonorgestrel  (LILETTA ) 20.1 MCG/DAY IUD IUD, 1 each by Intrauterine route once., Disp: , Rfl:    loratadine (CLARITIN) 10 MG tablet, Take 10 mg by mouth daily., Disp: , Rfl:    norethindrone  (MICRONOR ) 0.35 MG tablet, Take 1 tablet (0.35 mg total) by mouth daily., Disp: 84 tablet, Rfl: 4   rizatriptan  (MAXALT -MLT) 10 MG disintegrating tablet, Take 1 tablet (10 mg total) by mouth as needed for migraine. May repeat in 2 hours if needed, Disp: 27 tablet, Rfl: 0   rOPINIRole  (REQUIP ) 1 MG tablet, Take 1 tablet (1 mg total) by mouth 2 (two) times daily., Disp: 180 tablet, Rfl: 1   Semaglutide , 1 MG/DOSE, 4 MG/3ML SOPN, Inject 1 mg as directed once a week., Disp: 9 mL, Rfl: 1   Spacer/Aero-Holding Chambers (AEROCHAMBER MV) inhaler, Use as instructed, Disp: 1 each, Rfl: 0   telmisartan  (MICARDIS ) 20 MG tablet, Take 1 tablet (20 mg total) by mouth daily. In place of losartan , Disp: 90 tablet, Rfl: 1   topiramate  (TOPAMAX ) 25 MG tablet, Take 1-4 tablets (25-100 mg total) by mouth every evening., Disp: 90 tablet, Rfl: 0   traZODone  (DESYREL ) 50 MG tablet, TAKE 0.5-1 TABLETS BY MOUTH AT BEDTIME AS NEEDED FOR SLEEP., Disp: 90 tablet, Rfl: 0   Vitamin D , Ergocalciferol , (DRISDOL ) 1.25 MG (50000 UNIT) CAPS capsule, Take 50,000 Units by mouth once a week., Disp: , Rfl:    B Complex Vitamins (VITAMIN B-COMPLEX) TABS, Take 1 tablet by mouth daily. (Patient not taking: Reported on 12/18/2024), Disp: , Rfl:    cyanocobalamin  (VITAMIN B12) 500 MCG tablet, Take 500 mcg by mouth  daily. (Patient not taking: Reported on 12/18/2024), Disp: , Rfl:    nicotine  polacrilex (COMMIT) 2 MG lozenge, SMARTSIG:1 Lozenge(s) By Mouth Every 2 Hours PRN (Patient not taking: Reported on 12/18/2024), Disp: , Rfl:  [2]  Allergies Allergen Reactions   Dog Epithelium     Anaphylaxis with Rabbits.  Dogs, cats, birds cause swelling, extreme itching.   Dust Mite Extract    Pollen Extract    Soap    Tape    Tree Extract

## 2025-03-25 ENCOUNTER — Ambulatory Visit: Admitting: Family Medicine
